# Patient Record
Sex: Female | Born: 1980
Health system: Southern US, Community
[De-identification: ages and names within clinical notes are randomized; demographics above are authoritative.]

## PROBLEM LIST (undated history)

## (undated) ENCOUNTER — Ambulatory Visit (HOSPITAL_COMMUNITY): Payer: Commercial Managed Care - PPO

## (undated) DIAGNOSIS — T7840XA Allergy, unspecified, initial encounter: Secondary | ICD-10-CM

## (undated) DIAGNOSIS — N83209 Unspecified ovarian cyst, unspecified side: Secondary | ICD-10-CM

## (undated) DIAGNOSIS — L309 Dermatitis, unspecified: Secondary | ICD-10-CM

## (undated) DIAGNOSIS — Z3490 Encounter for supervision of normal pregnancy, unspecified, unspecified trimester: Secondary | ICD-10-CM

## (undated) DIAGNOSIS — F329 Major depressive disorder, single episode, unspecified: Secondary | ICD-10-CM

## (undated) DIAGNOSIS — M549 Dorsalgia, unspecified: Secondary | ICD-10-CM

## (undated) DIAGNOSIS — G43909 Migraine, unspecified, not intractable, without status migrainosus: Secondary | ICD-10-CM

## (undated) HISTORY — DX: Dorsalgia, unspecified: M54.9

## (undated) HISTORY — DX: Allergy, unspecified, initial encounter: T78.40XA

## (undated) HISTORY — DX: Major depressive disorder, single episode, unspecified: F32.9

## (undated) HISTORY — DX: Migraine, unspecified, not intractable, without status migrainosus: G43.909

## (undated) HISTORY — PX: CERVICAL DISCECTOMY: SHX98

## (undated) HISTORY — DX: Dermatitis, unspecified: L30.9

## (undated) HISTORY — PX: CERVICAL DISC ARTHROPLASTY: SHX587

---
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
## 2000-08-08 ENCOUNTER — Encounter: Payer: Self-pay | Admitting: Obstetrics and Gynecology

## 2000-08-08 ENCOUNTER — Ambulatory Visit (HOSPITAL_COMMUNITY): Admission: RE | Admit: 2000-08-08 | Discharge: 2000-08-08 | Payer: Self-pay | Admitting: Obstetrics and Gynecology

## 2000-08-29 ENCOUNTER — Encounter: Payer: Self-pay | Admitting: Obstetrics and Gynecology

## 2000-08-29 ENCOUNTER — Ambulatory Visit (HOSPITAL_COMMUNITY): Admission: RE | Admit: 2000-08-29 | Discharge: 2000-08-29 | Payer: Self-pay | Admitting: Obstetrics and Gynecology

## 2000-12-20 ENCOUNTER — Inpatient Hospital Stay (HOSPITAL_COMMUNITY): Admission: AD | Admit: 2000-12-20 | Discharge: 2000-12-20 | Payer: Self-pay | Admitting: Obstetrics and Gynecology

## 2000-12-23 ENCOUNTER — Inpatient Hospital Stay (HOSPITAL_COMMUNITY): Admission: AD | Admit: 2000-12-23 | Discharge: 2000-12-25 | Payer: Self-pay | Admitting: Obstetrics and Gynecology

## 2000-12-30 ENCOUNTER — Encounter: Admission: RE | Admit: 2000-12-30 | Discharge: 2001-01-29 | Payer: Self-pay | Admitting: Obstetrics and Gynecology

## 2001-02-03 ENCOUNTER — Other Ambulatory Visit: Admission: RE | Admit: 2001-02-03 | Discharge: 2001-02-03 | Payer: Self-pay | Admitting: Obstetrics and Gynecology

## 2003-05-15 ENCOUNTER — Other Ambulatory Visit: Admission: RE | Admit: 2003-05-15 | Discharge: 2003-05-15 | Payer: Self-pay | Admitting: Obstetrics and Gynecology

## 2011-01-19 ENCOUNTER — Encounter (HOSPITAL_COMMUNITY): Payer: Self-pay | Admitting: *Deleted

## 2011-01-19 ENCOUNTER — Inpatient Hospital Stay (HOSPITAL_COMMUNITY)
Admission: AD | Admit: 2011-01-19 | Discharge: 2011-01-19 | Disposition: A | Discharge status: Home / Self Care | Payer: Self-pay | Source: Ambulatory Visit | Attending: Obstetrics & Gynecology | Admitting: Obstetrics & Gynecology

## 2011-01-19 ENCOUNTER — Inpatient Hospital Stay (HOSPITAL_COMMUNITY): Payer: Self-pay

## 2011-01-19 DIAGNOSIS — O200 Threatened abortion: Secondary | ICD-10-CM | POA: Insufficient documentation

## 2011-01-19 DIAGNOSIS — O209 Hemorrhage in early pregnancy, unspecified: Secondary | ICD-10-CM

## 2011-01-19 HISTORY — DX: Unspecified ovarian cyst, unspecified side: N83.209

## 2011-01-19 LAB — CBC
MCH: 31.1 pg (ref 26.0–34.0)
MCV: 89.2 fL (ref 78.0–100.0)
Platelets: 237 10*3/uL (ref 150–400)
RDW: 12.8 % (ref 11.5–15.5)

## 2011-01-19 LAB — WET PREP, GENITAL

## 2011-01-19 LAB — URINALYSIS, ROUTINE W REFLEX MICROSCOPIC
Bilirubin Urine: NEGATIVE
Glucose, UA: NEGATIVE mg/dL
Ketones, ur: NEGATIVE mg/dL
Leukocytes, UA: NEGATIVE
Nitrite: NEGATIVE
Protein, ur: NEGATIVE mg/dL
Specific Gravity, Urine: >1.03 — ABNORMAL HIGH (ref 1.005–1.030)
Urobilinogen, UA: 0.2 mg/dL (ref 0.0–1.0)
pH: 6 (ref 5.0–8.0)

## 2011-01-19 LAB — URINE MICROSCOPIC-ADD ON

## 2011-01-19 LAB — POCT PREGNANCY, URINE: Preg Test, Ur: POSITIVE

## 2011-01-19 NOTE — ED Provider Notes (Signed)
History     Chief Complaint  Patient presents with  . Vaginal Bleeding   HPI Christina [name] 30 y.o. 5w 5d gestation  Having vaginal bleeding today.  Has not had any prenatal care.   OB History    Grav Para Term Preterm Abortions TAB SAB Ect Mult Living   3 1   1 1    1       Past Medical History  Diagnosis Date  . Ovarian cyst   . Postpartum hemorrhage     No past surgical history on file.  No family history on file.  History  Substance Use Topics  . Smoking status: Current Everyday Smoker -- 0.2 packs/day  . Smokeless tobacco: Not on file  . Alcohol Use: No    Allergies:  Allergies  Allergen Reactions  . Hydrocodone Itching    Prescriptions prior to admission  Medication Sig Dispense Refill  . acetaminophen (TYLENOL) 325 MG tablet Take 650 mg by mouth every 6 (six) hours as needed. Patient takes for pain       . prenatal vitamin w/FE, FA (PRENATAL 1 + 1) 27-1 MG TABS Take 1 tablet by mouth daily.          Review of Systems  Genitourinary:       Vaginal bleeding.   Physical Exam   Blood pressure 107/61, pulse 72, temperature 98.7 F (37.1 C), temperature source Oral, resp. rate 18, height 5\' 7"  (1.702 m), weight 205 lb 12.8 oz (93.35 kg), last menstrual period 12/11/2010.  Physical Exam  Nursing note and vitals reviewed. Constitutional: She is oriented to person, place, and time. She appears well-developed and well-nourished.  HENT:  Head: Normocephalic.  Eyes: EOM are normal.  Neck: Neck supple.  GI: Soft. There is no tenderness.  Genitourinary:       Speculum exam: Vulva - dark red bleeding noted Vagina - Small amount of dark red bleeding, no odor Cervix - No contact bleeding Bimanual exam: Cervix closed Uterus unable to size due to habitus Adnexa non tender, no masses bilaterally GC/Chlam, wet prep done Chaperone present for exam.  Musculoskeletal: Normal range of motion.  Neurological: She is alert and oriented to person, place, and  time.  Skin: Skin is warm and dry.  Psychiatric: She has a normal mood and affect.    MAU Course  Procedures  MDM Ultrasound - IUGS  5w 5d with no yolk sac, no fetal pole and moderate subchorionic hemorrhage.  Results for orders placed during the hospital encounter of 01/19/11 (from the past 24 hour(s))  URINALYSIS, ROUTINE W REFLEX MICROSCOPIC     Status: Abnormal   Collection Time   01/19/11  7:30 PM      Component Value Range   Color, Urine YELLOW  YELLOW    Appearance CLEAR  CLEAR    Specific Gravity, Urine >1.030 (*) 1.005 - 1.030    pH 6.0  5.0 - 8.0    Glucose, UA NEGATIVE  NEGATIVE (mg/dL)   Hgb urine dipstick LARGE (*) NEGATIVE    Bilirubin Urine NEGATIVE  NEGATIVE    Ketones, ur NEGATIVE  NEGATIVE (mg/dL)   Protein, ur NEGATIVE  NEGATIVE (mg/dL)   Urobilinogen, UA 0.2  0.0 - 1.0 (mg/dL)   Nitrite NEGATIVE  NEGATIVE    Leukocytes, UA NEGATIVE  NEGATIVE   URINE MICROSCOPIC-ADD ON     Status: Abnormal   Collection Time   01/19/11  7:30 PM      Component Value Range  Squamous Epithelial / LPF RARE  RARE    WBC, UA 0-2  <3 (WBC/hpf)   RBC / HPF 7-10  <3 (RBC/hpf)   Bacteria, UA FEW (*) RARE   POCT PREGNANCY, URINE     Status: Normal   Collection Time   01/19/11  8:09 PM      Component Value Range   Preg Test, Ur POSITIVE    CBC     Status: Abnormal   Collection Time   01/19/11  8:20 PM      Component Value Range   WBC 12.1 (*) 4.0 - 10.5 (K/uL)   RBC 4.09  3.87 - 5.11 (MIL/uL)   Hemoglobin 12.7  12.0 - 15.0 (g/dL)   HCT 29.5  62.1 - 30.8 (%)   MCV 89.2  78.0 - 100.0 (fL)   MCH 31.1  26.0 - 34.0 (pg)   MCHC 34.8  30.0 - 36.0 (g/dL)   RDW 65.7  84.6 - 96.2 (%)   Platelets 237  150 - 400 (K/uL)  HCG, QUANTITATIVE, PREGNANCY     Status: Abnormal   Collection Time   01/19/11  8:20 PM      Component Value Range   hCG, Beta Chain, Quant, S 5540 (*) <5 (mIU/mL)  ABO/RH     Status: Normal   Collection Time   01/19/11  8:20 PM      Component Value Range    ABO/RH(D) A POS    WET PREP, GENITAL     Status: Abnormal   Collection Time   01/19/11  8:30 PM      Component Value Range   Yeast, Wet Prep NONE SEEN  NONE SEEN    Trich, Wet Prep NONE SEEN  NONE SEEN    Clue Cells, Wet Prep FEW (*) NONE SEEN    WBC, Wet Prep HPF POC FEW (*) NONE SEEN     Assessment and Plan  Bleeding in pregnancy Moderate subchorionic hemorrhage Threatened miscarriage  Plan: Repeat ultrasound in one week If bleeding worsens, may have a miscarriage   Leviathan Macera 01/19/2011, 8:13 PM   Nolene Bernheim, NP 01/19/11 2143

## 2011-01-19 NOTE — Progress Notes (Cosign Needed)
Pt G3 P1, +UPT at home 2 wks ago.  LMP 12/08/2010, pt reports vag bleeding in panties this evening with cramping.

## 2011-01-20 LAB — GC/CHLAMYDIA PROBE AMP, GENITAL: Chlamydia, DNA Probe: NEGATIVE

## 2011-01-27 ENCOUNTER — Inpatient Hospital Stay (HOSPITAL_COMMUNITY): Payer: Self-pay

## 2011-01-27 ENCOUNTER — Inpatient Hospital Stay (HOSPITAL_COMMUNITY)
Admission: AD | Admit: 2011-01-27 | Discharge: 2011-01-27 | Disposition: A | Discharge status: Home / Self Care | Payer: Self-pay | Source: Ambulatory Visit | Attending: Obstetrics & Gynecology | Admitting: Obstetrics & Gynecology

## 2011-01-27 DIAGNOSIS — O039 Complete or unspecified spontaneous abortion without complication: Secondary | ICD-10-CM | POA: Insufficient documentation

## 2011-01-27 DIAGNOSIS — O209 Hemorrhage in early pregnancy, unspecified: Secondary | ICD-10-CM | POA: Insufficient documentation

## 2011-01-27 MED ORDER — IBUPROFEN 600 MG PO TABS: 600.0000 mg | ORAL_TABLET | Freq: Four times a day (QID) | ORAL | Status: AC | PRN

## 2011-01-27 MED ORDER — OXYCODONE-ACETAMINOPHEN 5-325 MG PO TABS: 1.0000 | ORAL_TABLET | ORAL | Status: AC | PRN

## 2011-01-27 MED ORDER — MISOPROSTOL 200 MCG PO TABS: 200.0000 ug | ORAL_TABLET | Freq: Once | ORAL | Status: DC

## 2011-01-27 MED ORDER — PROMETHAZINE HCL 25 MG PO TABS: 25.0000 mg | ORAL_TABLET | Freq: Four times a day (QID) | ORAL | Status: AC | PRN

## 2011-01-27 NOTE — Progress Notes (Signed)
Pt returns for repeat u/s. Seen  10/30. Pt reprts no bleeding at this time. Denies pain.

## 2011-01-27 NOTE — ED Provider Notes (Signed)
Christina [name] 54 y.o.G3P0011 @[redacted]w[redacted]d  Chief Complaint  Patient presents with  . Follow-up    SUBJECTIVE  HPI: Seen here 1 wk ago for bleeding. Quant was 5540. US showed GS [redacted]w[redacted]d, no YS. Here for scheduled rpt [name]. Denies abd pain or bleeding.   Past Medical History  Diagnosis Date  . Ovarian cyst   . Postpartum hemorrhage    No past surgical history on file. History   Social History  . Marital Status: Legally Separated    Spouse Name: N/A    Number of Children: N/A  . Years of Education: N/A   Occupational History  . Not on file.   Social History Main Topics  . Smoking status: Current Everyday Smoker -- 0.2 packs/day  . Smokeless tobacco: Not on file  . Alcohol Use: No  . Drug Use: No  . Sexually Active:    Other Topics Concern  . Not on file   Social History Narrative  . No narrative on file   No current facility-administered medications on file prior to encounter.   Current Outpatient Prescriptions on File Prior to Encounter  Medication Sig Dispense Refill  . acetaminophen (TYLENOL) 325 MG tablet Take 650 mg by mouth every 6 (six) hours as needed. Patient takes for pain       . prenatal vitamin w/FE, FA (PRENATAL 1 + 1) 27-1 MG TABS Take 1 tablet by mouth daily.         Allergies  Allergen Reactions  . Hydrocodone Itching    ROS: Pertinent items in HPI  OBJECTIVE  BP 109/56  Pulse 77  Ht 5\' 7"  (1.702 m)  Wt 92.987 kg (205 lb)  BMI 32.11 kg/m2  SpO2 96%  LMP 12/11/2010  A pos blood type   Physical Exam  Constitutional: She is oriented to person, place, and time and well-developed, well-nourished, and in no distress.  HENT:  Head: Normocephalic.  Neck: Neck supple.  Abdominal: Soft. There is no tenderness.  Neurological: She is alert and oriented to person, place, and time.  Skin: Skin is warm and dry.  Psychiatric: Affect normal.   US Ob Transvaginal  01/27/2011  *RADIOLOGY REPORT*  Clinical Data: Pregnant with vaginal bleeding.   TRANSVAGINAL OB ULTRASOUND  Technique:  Transvaginal ultrasound was performed for evaluation of the gestation as well as the maternal uterus and adnexal regions.  Comparison: 01/19/2011.  Findings: There is a single intrauterine gestational sac with a mean sac diameter of 1.0 cm which correlates with a 5-week-5-day gestation.  No yolk sac or embryo is identified.  A moderate subchorionic hemorrhage is noted.  This measures 2.5 x 1.1 x 2.2 cm.  Both ovaries are normal.  No free pelvic fluid collections.  IMPRESSION:  1.  Single intrauterine gestational sac estimated at 5 weeks and 5 days gestation.  8 days ago it was 5 weeks and 4 days gestation. No yolk sac or embryo is identified. 2.  Slightly smaller subchorionic hemorrhage. 3.  Normal ovaries.  Original Report Authenticated By: P. Loralie Champagne, M.D.   ASSESSMENT   Nonviable pregnancy  PLAN D/W Dr. Despina Hidden: obtain quant and if dropping may offer cytotec vs. expectant management. If increased, follow quant in 3-7d. Bernita Buffy, CNM evaluated the patient and discussed with patient and family results of the ultrasound and failed pregnancy.   Care turned over to [facility] @ 9:00pm. Patient awaiting Bhcg. Patient states she is ready to go home and request Rx for the cytotec and to be called about  results of the Bhcg.       Early Intrauterine Pregnancy Failure  _x__  Documented intrauterine pregnancy failure less than or equal to [redacted] weeks gestation  _x__  No serious current illness  __x_  Baseline Hgb greater than or equal to 10g/dl  _x__  Patient has easily accessible transportation to the hospital  __x_  Clear preference  __x_  Practitioner/physician deems patient reliable  _x__  Counseling by practitioner or physician  _x__  Patient education by RN  _x__  Consent form signed  ___  Rho-Gam given by RN if indicated  _x__ Medication dispensed   ___   Cytotec 800 mcg  _x_   Intravaginally by patient at home         __   Intravaginally by  RN in MAU        __   Rectally by patient at home        __   Rectally by RN in MAU  _x__  Ibuprofen 600 mg 1 tablet by mouth every 6 hours as needed #30  _x__  Hydrocodone/acetaminophen 5/325 mg by mouth every 4 to 6 hours as needed  __x_  Phenergan 12.5 mg by mouth every 4 hours as needed for nausea  The pregnancy hormone level tonight has increased from 5,540 eight days ago to 10,587 today.  Although the numbers did increase the rise ws not normal. I discussed the results with Dr. Despina Hidden and we will give the patient option of holding the Cytotec and repeat the ultrasound in one week or using the Cytotec.  I discussed the lab results in detail with the patient and the option to return in one week to repeat the ultrasound. The patient states that she has accepted that this is a pregnancy failure and wants to go ahead with the Cytotec. She will return in 2 weeks. She will return immediately for heavy bleeding, severe pain, fever or other problems.  [place], [place] 01/27/11 2232

## 2011-03-23 NOTE — L&D Delivery Note (Signed)
Delivery Note At 4:41 PM a viable female was delivered via Vaginal, Spontaneous Delivery (Presentation: Middle Occiput Anterior).  APGAR: 9, 9; weight P.   Placenta status: Intact, Spontaneous.  Cord: 3 vessels with the following complications: None.    Anesthesia: Epidural  Episiotomy: None Lacerations: Labial B Suture Repair: 3.0 vicryl rapide Est. Blood Loss (mL): 400  Mom to postpartum.  Baby to stay with mom.  BOVARD,Najiyah Paris 03/11/2012, 4:59 PM  A+/Br/Contra?Rachelle Hora

## 2012-03-11 ENCOUNTER — Encounter (HOSPITAL_COMMUNITY): Payer: Self-pay | Admitting: *Deleted

## 2012-03-11 ENCOUNTER — Inpatient Hospital Stay (HOSPITAL_COMMUNITY): Payer: Medicaid Other | Admitting: Anesthesiology

## 2012-03-11 ENCOUNTER — Encounter (HOSPITAL_COMMUNITY): Payer: Self-pay | Admitting: Anesthesiology

## 2012-03-11 ENCOUNTER — Inpatient Hospital Stay (HOSPITAL_COMMUNITY)
Admission: EM | Admit: 2012-03-11 | Discharge: 2012-03-12 | DRG: 775 | Disposition: A | Discharge status: Home / Self Care | Payer: Medicaid Other | Source: Ambulatory Visit | Attending: Obstetrics and Gynecology | Admitting: Obstetrics and Gynecology

## 2012-03-11 DIAGNOSIS — Z3490 Encounter for supervision of normal pregnancy, unspecified, unspecified trimester: Secondary | ICD-10-CM

## 2012-03-11 DIAGNOSIS — O99334 Smoking (tobacco) complicating childbirth: Secondary | ICD-10-CM | POA: Diagnosis present

## 2012-03-11 HISTORY — DX: Encounter for supervision of normal pregnancy, unspecified, unspecified trimester: Z34.90

## 2012-03-11 LAB — CBC
MCH: 30.3 pg (ref 26.0–34.0)
MCHC: 33.8 g/dL (ref 30.0–36.0)
MCV: 89.5 fL (ref 78.0–100.0)
Platelets: 206 10*3/uL (ref 150–400)
RBC: 3.8 MIL/uL — ABNORMAL LOW (ref 3.87–5.11)

## 2012-03-11 MED ORDER — ZOLPIDEM TARTRATE 5 MG PO TABS: 5.0000 mg | ORAL_TABLET | Freq: Every evening | ORAL | Status: DC | PRN

## 2012-03-11 MED ORDER — PRENATAL MULTIVITAMIN CH: 1.0000 | ORAL_TABLET | Freq: Every day | ORAL | Status: DC

## 2012-03-11 MED ORDER — EPHEDRINE 5 MG/ML INJ: 10.0000 mg | INTRAVENOUS | Status: DC | PRN

## 2012-03-11 MED ORDER — BUTORPHANOL TARTRATE 1 MG/ML IJ SOLN
1.0000 mg | INTRAMUSCULAR | Status: DC | PRN
  Administered 2012-03-11 (×2): 1 mg via INTRAVENOUS
  Filled 2012-03-11: qty 1

## 2012-03-11 MED ORDER — BENZOCAINE-MENTHOL 20-0.5 % EX AERO
1.0000 "application " | INHALATION_SPRAY | CUTANEOUS | Status: DC | PRN
  Filled 2012-03-11: qty 56

## 2012-03-11 MED ORDER — TERBUTALINE SULFATE 1 MG/ML IJ SOLN: 0.2500 mg | Freq: Once | INTRAMUSCULAR | Status: DC | PRN

## 2012-03-11 MED ORDER — CITRIC ACID-SODIUM CITRATE 334-500 MG/5ML PO SOLN: 30.0000 mL | ORAL | Status: DC | PRN

## 2012-03-11 MED ORDER — SIMETHICONE 80 MG PO CHEW: 80.0000 mg | CHEWABLE_TABLET | ORAL | Status: DC | PRN

## 2012-03-11 MED ORDER — DIPHENHYDRAMINE HCL 25 MG PO CAPS: 25.0000 mg | ORAL_CAPSULE | Freq: Four times a day (QID) | ORAL | Status: DC | PRN

## 2012-03-11 MED ORDER — LIDOCAINE HCL (PF) 1 % IJ SOLN
INTRAMUSCULAR | Status: DC | PRN
  Administered 2012-03-11 (×4): 4 mL

## 2012-03-11 MED ORDER — LACTATED RINGERS IV SOLN
500.0000 mL | Freq: Once | INTRAVENOUS | Status: AC
  Administered 2012-03-11: 12:00:00 via INTRAVENOUS

## 2012-03-11 MED ORDER — SENNOSIDES-DOCUSATE SODIUM 8.6-50 MG PO TABS: 2.0000 | ORAL_TABLET | Freq: Every day | ORAL | Status: DC

## 2012-03-11 MED ORDER — OXYTOCIN 40 UNITS IN LACTATED RINGERS INFUSION - SIMPLE MED
62.5000 mL/h | INTRAVENOUS | Status: DC
  Filled 2012-03-11: qty 1000

## 2012-03-11 MED ORDER — OXYCODONE-ACETAMINOPHEN 5-325 MG PO TABS: 1.0000 | ORAL_TABLET | ORAL | Status: DC | PRN

## 2012-03-11 MED ORDER — BUTORPHANOL TARTRATE 1 MG/ML IJ SOLN
INTRAMUSCULAR | Status: AC
  Administered 2012-03-11: 1 mg via INTRAVENOUS
  Filled 2012-03-11: qty 1

## 2012-03-11 MED ORDER — DIPHENHYDRAMINE HCL 50 MG/ML IJ SOLN: 12.5000 mg | INTRAMUSCULAR | Status: DC | PRN

## 2012-03-11 MED ORDER — OXYTOCIN BOLUS FROM INFUSION: 500.0000 mL | INTRAVENOUS | Status: DC

## 2012-03-11 MED ORDER — ONDANSETRON HCL 4 MG PO TABS: 4.0000 mg | ORAL_TABLET | ORAL | Status: DC | PRN

## 2012-03-11 MED ORDER — PHENYLEPHRINE 40 MCG/ML (10ML) SYRINGE FOR IV PUSH (FOR BLOOD PRESSURE SUPPORT): 80.0000 ug | PREFILLED_SYRINGE | INTRAVENOUS | Status: DC | PRN

## 2012-03-11 MED ORDER — LANOLIN HYDROUS EX OINT: TOPICAL_OINTMENT | CUTANEOUS | Status: DC | PRN

## 2012-03-11 MED ORDER — ONDANSETRON HCL 4 MG/2ML IJ SOLN: 4.0000 mg | Freq: Four times a day (QID) | INTRAMUSCULAR | Status: DC | PRN

## 2012-03-11 MED ORDER — LIDOCAINE HCL (PF) 1 % IJ SOLN
30.0000 mL | INTRAMUSCULAR | Status: DC | PRN
  Filled 2012-03-11: qty 30

## 2012-03-11 MED ORDER — ACETAMINOPHEN 325 MG PO TABS: 650.0000 mg | ORAL_TABLET | ORAL | Status: DC | PRN

## 2012-03-11 MED ORDER — LACTATED RINGERS IV SOLN
INTRAVENOUS | Status: DC
  Administered 2012-03-11: 13:00:00 via INTRAVENOUS

## 2012-03-11 MED ORDER — PRENATAL PLUS 27-1 MG PO TABS
1.0000 | ORAL_TABLET | Freq: Every day | ORAL | Status: DC
  Administered 2012-03-12: 1 via ORAL
  Filled 2012-03-11: qty 1

## 2012-03-11 MED ORDER — LACTATED RINGERS IV SOLN: 500.0000 mL | INTRAVENOUS | Status: DC | PRN

## 2012-03-11 MED ORDER — ONDANSETRON HCL 4 MG/2ML IJ SOLN: 4.0000 mg | INTRAMUSCULAR | Status: DC | PRN

## 2012-03-11 MED ORDER — WITCH HAZEL-GLYCERIN EX PADS: 1.0000 "application " | MEDICATED_PAD | CUTANEOUS | Status: DC | PRN

## 2012-03-11 MED ORDER — FENTANYL 2.5 MCG/ML BUPIVACAINE 1/10 % EPIDURAL INFUSION (WH - ANES)
14.0000 mL/h | INTRAMUSCULAR | Status: DC
  Administered 2012-03-11: 14 mL/h via EPIDURAL
  Filled 2012-03-11: qty 125

## 2012-03-11 MED ORDER — EPHEDRINE 5 MG/ML INJ
10.0000 mg | INTRAVENOUS | Status: DC | PRN
  Filled 2012-03-11: qty 4

## 2012-03-11 MED ORDER — LACTATED RINGERS IV SOLN: INTRAVENOUS | Status: DC

## 2012-03-11 MED ORDER — IBUPROFEN 600 MG PO TABS
600.0000 mg | ORAL_TABLET | Freq: Four times a day (QID) | ORAL | Status: DC
  Administered 2012-03-12 (×4): 600 mg via ORAL
  Filled 2012-03-11 (×4): qty 1

## 2012-03-11 MED ORDER — IBUPROFEN 600 MG PO TABS: 600.0000 mg | ORAL_TABLET | Freq: Four times a day (QID) | ORAL | Status: DC | PRN

## 2012-03-11 MED ORDER — PHENYLEPHRINE 40 MCG/ML (10ML) SYRINGE FOR IV PUSH (FOR BLOOD PRESSURE SUPPORT)
80.0000 ug | PREFILLED_SYRINGE | INTRAVENOUS | Status: DC | PRN
  Filled 2012-03-11: qty 5

## 2012-03-11 MED ORDER — OXYTOCIN 40 UNITS IN LACTATED RINGERS INFUSION - SIMPLE MED: 1.0000 m[IU]/min | INTRAVENOUS | Status: DC

## 2012-03-11 MED ORDER — DIBUCAINE 1 % RE OINT: 1.0000 "application " | TOPICAL_OINTMENT | RECTAL | Status: DC | PRN

## 2012-03-11 NOTE — Progress Notes (Signed)
Patient ID: Christina [name], female   DOB: November 20, 1980, 31 y.o.   MRN: [id number] AROM for clear fluid, w/o difficulty/complication SVE 5.5/90/0  FHTs 120's, mod var toco q 

## 2012-03-11 NOTE — Anesthesia Postprocedure Evaluation (Signed)
  Anesthesia Post-op Note  Patient: Christina [name]  Procedure(s) Performed: * No procedures listed *  Patient Location: PACU and Mother/Baby  Anesthesia Type:Epidural  Level of Consciousness: awake, alert  and oriented  Airway and Oxygen Therapy: Patient Spontanous Breathing    Post-op Assessment: Patient's Cardiovascular Status Stable and Respiratory Function Stable  Post-op Vital Signs: stable  Complications: No apparent anesthesia complications

## 2012-03-11 NOTE — H&P (Signed)
Christina [name] is a 31 y.o. female G4P1021 at 39+ with regular, painful contractions.  PNC uncomplicated except late entry to care, about 15wks.  H/O PIH.  Pt also is a smoker.  +FM, no LOF, no VB.   Maternal Medical History:  Reason for admission: Reason for admission: contractions.  Contractions: Onset was 6-12 hours ago.   Frequency: regular.   Perceived severity is moderate.    Fetal activity: Perceived fetal activity is normal.      OB History    Grav Para Term Preterm Abortions TAB SAB Ect Mult Living   4 1   2  0 2   1    G1 TAB, G2 SVD 6#14, female, G3 SAB, G4 present; no STDs,    Past Medical History  Diagnosis Date  . Ovarian cyst   . Postpartum hemorrhage   . Normal pregnancy 03/11/2012   PMH none PSH WTE, TAB Family History:breast cancer, CAD, HTN, Lung CA, MI Social History:  reports that she has been smoking.  She does not have any smokeless tobacco history on file. She reports that she does not drink alcohol or use illicit drugs. Meds PNV All NKDA - hydrocodone itch   Prenatal Transfer Tool  Maternal Diabetes: No Genetic Screening: Normal Maternal Ultrasounds/Referrals: Normal Fetal Ultrasounds or other Referrals:  None Maternal Substance Abuse:  Yes:  Type: Smoker Significant Maternal Medications:  None Significant Maternal Lab Results:  Lab values include: Group B Strep negative Other Comments:  late entry to [facility] - 15 week  Review of Systems  Constitutional: Negative.   HENT: Negative.   Eyes: Negative.   Respiratory: Negative.   Cardiovascular: Negative.   Gastrointestinal: Negative.   Genitourinary: Negative.   Musculoskeletal: Negative.   Skin: Negative.   Neurological: Negative.   Psychiatric/Behavioral: Negative.     Dilation: 5.5 Effacement (%): 90 Station: -1 Exam by:: Rzhang,rnc-ob Blood pressure 94/34, pulse 78, temperature 98.6 F (37 C), temperature source Oral, resp. rate 20, height 5' 6.5" (1.689 m), weight 102.694 kg  (226 lb 6.4 oz). Maternal Exam:  Uterine Assessment: Contraction strength is moderate.  Contraction frequency is regular.   Abdomen: Fundal height is appropriate for gestation.   Fetal presentation: vertex  Introitus: Normal vulva. Normal vagina.  Pelvis: adequate for delivery.   Cervix: Cervix evaluated by digital exam.     Physical Exam  Constitutional: She is oriented to person, place, and time. She appears well-developed and well-nourished.  Cardiovascular: Normal rate and regular rhythm.   Respiratory: Effort normal and breath sounds normal. No respiratory distress.  GI: Soft. Bowel sounds are normal. There is no tenderness.  Musculoskeletal: Normal range of motion.  Neurological: She is alert and oriented to person, place, and time.  Skin: Skin is warm and dry.  Psychiatric: She has a normal mood and affect. Her behavior is normal.    Prenatal labs: ABO, Rh: --/--/A POS (12/21 0830) Antibody: NEG (12/21 0830) Rubella:  immune  RPR:   NR HBsAg:   neg HIV:   neg GBS:   neg Hgb 13.0/ Pap WNL HR HPV neg/ Ur Cx neg/ Plt 223K/ GC neg/ Chl neg/ CF neg/ AFP WNL/  EDC 12/23 - dated by 15wk scan Anat [name] - female, nl anat, post plac  Tdap 10/1, flu declines  Assessment/Plan: 31yo [id number]  At 39+ in labor gbbs neg Expect SVD Epidural, stadol, pitocin prn   BOVARD,Chalene Treu 03/11/2012, 11:13 AM

## 2012-03-11 NOTE — Anesthesia Preprocedure Evaluation (Addendum)
Anesthesia Evaluation  Patient identified by MRN, date of birth, ID band Patient awake    Reviewed: Allergy & Precautions, H&P , NPO status , Patient's Chart, lab work & pertinent test results, reviewed documented beta blocker date and time   History of Anesthesia Complications Negative for: history of anesthetic complications  Airway Mallampati: I TM Distance: >3 FB Neck ROM: full    Dental  (+) Teeth Intact   Pulmonary Current Smoker,  breath sounds clear to auscultation        Cardiovascular negative cardio ROS  Rhythm:regular Rate:Normal     Neuro/Psych negative neurological ROS  negative psych ROS   GI/Hepatic negative GI ROS, Neg liver ROS,   Endo/Other  negative endocrine ROS  Renal/GU negative Renal ROS  negative genitourinary   Musculoskeletal   Abdominal   Peds  Hematology negative hematology ROS (+)   Anesthesia Other Findings   Reproductive/Obstetrics (+) Pregnancy                          Anesthesia Physical Anesthesia Plan  ASA: II  Anesthesia Plan: Epidural   Post-op Pain Management:    Induction:   Airway Management Planned:   Additional Equipment:   Intra-op Plan:   Post-operative Plan:   Informed Consent: I have reviewed the patients History and Physical, chart, labs and discussed the procedure including the risks, benefits and alternatives for the proposed anesthesia with the patient or authorized representative who has indicated his/her understanding and acceptance.     Plan Discussed with:   Anesthesia Plan Comments:         Anesthesia Quick Evaluation

## 2012-03-11 NOTE — Progress Notes (Signed)
Dr Ellyn Hack notified of pts VE, FHR, and contraction pattern, orders received

## 2012-03-11 NOTE — Anesthesia Procedure Notes (Signed)
Epidural Patient location during procedure: OB Start time: 03/11/2012 12:18 PM  Staffing Performed by: anesthesiologist   Preanesthetic Checklist Completed: patient identified, site marked, surgical consent, pre-op evaluation, timeout performed, IV checked, risks and benefits discussed and monitors and equipment checked  Epidural Patient position: sitting Prep: site prepped and draped and DuraPrep Patient monitoring: continuous pulse ox and blood pressure Approach: midline Injection technique: LOR air  Needle:  Needle type: Tuohy  Needle gauge: 17 G Needle length: 9 cm and 9 Needle insertion depth: 6.5 cm Catheter type: closed end flexible Catheter size: 19 Gauge Catheter at skin depth: 11.5 cm Test dose: negative  Assessment Events: blood not aspirated, injection not painful, no injection resistance, negative IV test and no paresthesia  Additional Notes Discussed risk of headache, infection, bleeding, nerve injury and failed or incomplete block.  Patient voices understanding and wishes to proceed.  Epidural placed easily on first attempt.  Patient tolerated procedure well with no apparent complications.  Jasmine December, MD Reason for block:procedure for pain

## 2012-03-12 LAB — CBC
HCT: 31 % — ABNORMAL LOW (ref 36.0–46.0)
Hemoglobin: 10.5 g/dL — ABNORMAL LOW (ref 12.0–15.0)
MCHC: 33.9 g/dL (ref 30.0–36.0)
RDW: 13.5 % (ref 11.5–15.5)
WBC: 18.1 10*3/uL — ABNORMAL HIGH (ref 4.0–10.5)

## 2012-03-12 MED ORDER — PRENATAL PLUS 27-1 MG PO TABS: 1.0000 | ORAL_TABLET | Freq: Every day | ORAL | Status: DC

## 2012-03-12 MED ORDER — IBUPROFEN 800 MG PO TABS: 800.0000 mg | ORAL_TABLET | Freq: Four times a day (QID) | ORAL | Status: DC

## 2012-03-12 MED ORDER — OXYCODONE-ACETAMINOPHEN 5-325 MG PO TABS: 1.0000 | ORAL_TABLET | ORAL | Status: DC | PRN

## 2012-03-12 NOTE — Progress Notes (Addendum)
Post Partum Day 1 Subjective: no complaints, up ad lib, tolerating PO and nl lochia, pain controlled  Objective: Blood pressure 95/60, pulse 88, temperature 98 F (36.7 C), temperature source Oral, resp. rate 20, height 5' 6.5" (1.689 m), weight 102.694 kg (226 lb 6.4 oz), SpO2 100.00%, unknown if currently breastfeeding.  Physical Exam:  General: alert and no distress Lochia: appropriate Uterine Fundus: firm   Basename 03/12/12 0527 03/11/12 0830  HGB 10.5* 11.5*  HCT 31.0* 34.0*    Assessment/Plan: Plan for discharge tomorrow.  Doing well.  Routine care.  Pt desires to be discharged today, OK per peds.  Will d/c today with Motrin, Percocet, PNV.     LOS: 1 day   BOVARD,Nicholes Hibler 03/12/2012, 8:26 AM

## 2012-03-12 NOTE — Discharge Summary (Signed)
Obstetric Discharge Summary Reason for Admission: onset of labor Prenatal Procedures: none Intrapartum Procedures: spontaneous vaginal delivery Postpartum Procedures: none Complications-Operative and Postpartum: labial laceration Hemoglobin  Date Value Range Status  03/12/2012 10.5* 12.0 - 15.0 g/dL Final     HCT  Date Value Range Status  03/12/2012 31.0* 36.0 - 46.0 % Final    Physical Exam:  General: alert and no distress Lochia: appropriate Uterine Fundus: firm  Discharge Diagnoses: Term Pregnancy-delivered  Discharge Information: Date: 03/12/2012 Activity: pelvic rest Diet: routine Medications: PNV, Ibuprofen and Percocet Condition: stable Instructions: refer to practice specific booklet Discharge to: home Follow-up Information    Follow up with BOVARD,Emmry Hinsch, MD. Schedule an appointment as soon as possible for a visit in 6 weeks.   Contact information:   [address] [place] [address] [phone]          Newborn Data: Live born female  Birth Weight: 6 lb 10.9 oz (3031 g) APGAR: 9, 9  Home with mother.  BOVARD,Carlota Philley 03/12/2012, 9:06 AM

## 2012-03-13 LAB — TYPE AND SCREEN: Unit division: 0

## 2012-03-16 ENCOUNTER — Telehealth (HOSPITAL_COMMUNITY): Payer: Self-pay | Admitting: *Deleted

## 2012-03-16 NOTE — Telephone Encounter (Signed)
Resolve episode 

## 2012-04-12 LAB — HM PAP SMEAR: HM Pap smear: NORMAL

## 2013-12-31 ENCOUNTER — Telehealth: Payer: Self-pay

## 2013-12-31 DIAGNOSIS — E669 Obesity, unspecified: Secondary | ICD-10-CM

## 2013-12-31 NOTE — Telephone Encounter (Signed)
Ok for labs- dx obesity

## 2013-12-31 NOTE — Telephone Encounter (Signed)
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 She is in need of CBC-diff, Cmet, T4 and TSH. She is not scheduled to see Dr.Lowne until 02/22/14. Please advise      KP

## 2014-01-01 ENCOUNTER — Other Ambulatory Visit (INDEPENDENT_AMBULATORY_CARE_PROVIDER_SITE_OTHER): Payer: 59

## 2014-01-01 DIAGNOSIS — E669 Obesity, unspecified: Secondary | ICD-10-CM

## 2014-01-01 LAB — CBC WITH DIFFERENTIAL/PLATELET
BASOS ABS: 0.1 10*3/uL (ref 0.0–0.1)
Basophils Relative: 0.9 % (ref 0.0–3.0)
EOS ABS: 0.3 10*3/uL (ref 0.0–0.7)
Eosinophils Relative: 2.9 % (ref 0.0–5.0)
HEMATOCRIT: 44.3 % (ref 36.0–46.0)
Hemoglobin: 14.4 g/dL (ref 12.0–15.0)
LYMPHS ABS: 1.5 10*3/uL (ref 0.7–4.0)
Lymphocytes Relative: 16.1 % (ref 12.0–46.0)
MCHC: 32.6 g/dL (ref 30.0–36.0)
MCV: 88.4 fl (ref 78.0–100.0)
MONO ABS: 0.7 10*3/uL (ref 0.1–1.0)
MONOS PCT: 6.9 % (ref 3.0–12.0)
Neutro Abs: 6.9 10*3/uL (ref 1.4–7.7)
Neutrophils Relative %: 73.2 % (ref 43.0–77.0)
PLATELETS: 231 10*3/uL (ref 150.0–400.0)
RBC: 5.01 Mil/uL (ref 3.87–5.11)
RDW: 14.3 % (ref 11.5–15.5)
WBC: 9.5 10*3/uL (ref 4.0–10.5)

## 2014-01-01 LAB — COMPREHENSIVE METABOLIC PANEL
ALK PHOS: 50 U/L (ref 39–117)
ALT: 11 U/L (ref 0–35)
AST: 17 U/L (ref 0–37)
Albumin: 4.1 g/dL (ref 3.5–5.2)
BILIRUBIN TOTAL: 1.2 mg/dL (ref 0.2–1.2)
BUN: 12 mg/dL (ref 6–23)
CO2: 24 meq/L (ref 19–32)
Calcium: 9.4 mg/dL (ref 8.4–10.5)
Chloride: 105 mEq/L (ref 96–112)
Creatinine, Ser: 0.8 mg/dL (ref 0.4–1.2)
GFR: 85.36 mL/min (ref >60.00)
Glucose, Bld: 85 mg/dL (ref 70–99)
Potassium: 4.3 mEq/L (ref 3.5–5.1)
SODIUM: 138 meq/L (ref 135–145)
TOTAL PROTEIN: 7.8 g/dL (ref 6.0–8.3)

## 2014-01-01 LAB — TSH: TSH: 0.89 u[IU]/mL (ref 0.35–4.50)

## 2014-01-01 LAB — T4, FREE: Free T4: 0.89 ng/dL (ref 0.60–1.60)

## 2014-01-02 ENCOUNTER — Other Ambulatory Visit (INDEPENDENT_AMBULATORY_CARE_PROVIDER_SITE_OTHER): Payer: 59

## 2014-01-02 ENCOUNTER — Encounter: Payer: Self-pay | Admitting: General Practice

## 2014-01-02 DIAGNOSIS — E669 Obesity, unspecified: Secondary | ICD-10-CM

## 2014-01-02 NOTE — Telephone Encounter (Signed)
Pt is wanting to have cholesterol labs entered as well.  Pt did lab work yesterday.  Can we had orders to that blood that was taken?

## 2014-01-02 NOTE — Telephone Encounter (Signed)
Can we add lipids to labs drawn yesterday?  Dx obesity

## 2014-01-03 ENCOUNTER — Encounter (HOSPITAL_COMMUNITY): Payer: Self-pay | Admitting: Family Medicine

## 2014-01-03 ENCOUNTER — Emergency Department (INDEPENDENT_AMBULATORY_CARE_PROVIDER_SITE_OTHER)
Admission: EM | Admit: 2014-01-03 | Discharge: 2014-01-03 | Disposition: A | Discharge status: Home / Self Care | Payer: 59 | Source: Home / Self Care | Attending: Family Medicine | Admitting: Family Medicine

## 2014-01-03 ENCOUNTER — Encounter: Payer: Self-pay | Admitting: General Practice

## 2014-01-03 DIAGNOSIS — A084 Viral intestinal infection, unspecified: Secondary | ICD-10-CM

## 2014-01-03 LAB — POCT URINALYSIS DIP (DEVICE)
GLUCOSE, UA: NEGATIVE mg/dL
KETONES UR: NEGATIVE mg/dL
LEUKOCYTES UA: NEGATIVE
Nitrite: NEGATIVE
PROTEIN: 30 mg/dL — AB
Specific Gravity, Urine: >=1.03 (ref 1.005–1.030)
Urobilinogen, UA: 0.2 mg/dL (ref 0.0–1.0)
pH: 5.5 (ref 5.0–8.0)

## 2014-01-03 LAB — LIPID PANEL
CHOL/HDL RATIO: 4
Cholesterol: 157 mg/dL (ref 0–200)
HDL: 41.7 mg/dL (ref >39.00)
LDL CALC: 105 mg/dL — AB (ref 0–99)
NonHDL: 115.3
TRIGLYCERIDES: 50 mg/dL (ref 0.0–149.0)
VLDL: 10 mg/dL (ref 0.0–40.0)

## 2014-01-03 LAB — CBC WITH DIFFERENTIAL/PLATELET
Basophils Absolute: 0 10*3/uL (ref 0.0–0.1)
Basophils Relative: 0 % (ref 0–1)
EOS ABS: 0.1 10*3/uL (ref 0.0–0.7)
Eosinophils Relative: 1 % (ref 0–5)
HEMATOCRIT: 43.2 % (ref 36.0–46.0)
Hemoglobin: 14.9 g/dL (ref 12.0–15.0)
LYMPHS ABS: 0.9 10*3/uL (ref 0.7–4.0)
Lymphocytes Relative: 11 % — ABNORMAL LOW (ref 12–46)
MCH: 30 pg (ref 26.0–34.0)
MCHC: 34.5 g/dL (ref 30.0–36.0)
MCV: 87.1 fL (ref 78.0–100.0)
MONO ABS: 0.4 10*3/uL (ref 0.1–1.0)
Monocytes Relative: 5 % (ref 3–12)
Neutro Abs: 6.7 10*3/uL (ref 1.7–7.7)
Neutrophils Relative %: 83 % — ABNORMAL HIGH (ref 43–77)
PLATELETS: 189 10*3/uL (ref 150–400)
RBC: 4.96 MIL/uL (ref 3.87–5.11)
RDW: 13.3 % (ref 11.5–15.5)
WBC: 8.1 10*3/uL (ref 4.0–10.5)

## 2014-01-03 LAB — COMPREHENSIVE METABOLIC PANEL
ALT: 11 U/L (ref 0–35)
AST: 19 U/L (ref 0–37)
Albumin: 4.1 g/dL (ref 3.5–5.2)
Alkaline Phosphatase: 58 U/L (ref 39–117)
Anion gap: 12 (ref 5–15)
BILIRUBIN TOTAL: 0.4 mg/dL (ref 0.3–1.2)
BUN: 9 mg/dL (ref 6–23)
CHLORIDE: 101 meq/L (ref 96–112)
CO2: 25 meq/L (ref 19–32)
Calcium: 9.2 mg/dL (ref 8.4–10.5)
Creatinine, Ser: 0.85 mg/dL (ref 0.50–1.10)
GFR calc Af Amer: >90 mL/min (ref >90)
GFR, EST NON AFRICAN AMERICAN: 90 mL/min — AB (ref >90)
Glucose, Bld: 117 mg/dL — ABNORMAL HIGH (ref 70–99)
Potassium: 4 mEq/L (ref 3.7–5.3)
SODIUM: 138 meq/L (ref 137–147)
Total Protein: 7.5 g/dL (ref 6.0–8.3)

## 2014-01-03 LAB — CLOSTRIDIUM DIFFICILE BY PCR: CDIFFPCR: NEGATIVE

## 2014-01-03 LAB — POCT PREGNANCY, URINE: PREG TEST UR: NEGATIVE

## 2014-01-03 MED ORDER — ONDANSETRON 4 MG PO TBDP: 4.0000 mg | ORAL_TABLET | Freq: Three times a day (TID) | ORAL | Status: DC | PRN

## 2014-01-03 NOTE — ED Provider Notes (Signed)
CSN: [id number]     Arrival date & time 01/03/14  1441 History   First MD Initiated Contact with Patient 01/03/14 1454     Chief Complaint  Patient presents with  . Abdominal Pain   (Consider location/radiation/quality/duration/timing/severity/associated sxs/prior Treatment) HPI  Developed diarrhea and upset stomach 2 days ago. pepto and imodium w/o benefit. Watery, non-bloody. Getting worse. Associated w/ stiff neck which started yesterday. Few sips of powerade w/ some benefit. Worse w/ all food. Works in a clinical office w/ sick patients. Self induced vomiting x1. Denies fevers, emesis. Denies eating any spoiled foods or recent travel.    Past Medical History  Diagnosis Date  . Ovarian cyst   . Postpartum hemorrhage   . Normal pregnancy 03/11/2012  . SVD (spontaneous vaginal delivery) 03/11/2012   History reviewed. No pertinent past surgical history. No family history on file. History  Substance Use Topics  . Smoking status: Current Every Day Smoker -- 0.25 packs/day  . Smokeless tobacco: Not on file  . Alcohol Use: No   OB History   Grav Para Term Preterm Abortions TAB SAB Ect Mult Living   4 2 1  2  0 2   2     Review of Systems Per HPI with all other pertinent systems negative.   Allergies  Hydrocodone and Prednisone  Home Medications   Prior to Admission medications   Medication Sig Start Date End Date Taking? Authorizing Provider  Bismuth Subsalicylate (KAOPECTATE PO) Take by mouth.   Yes Historical Provider, MD  bismuth subsalicylate (PEPTO BISMOL) 262 MG/15ML suspension Take 30 mLs by mouth every 6 (six) hours as needed.   Yes Historical Provider, MD  etonogestrel (IMPLANON) 68 MG IMPL implant 1 each by Subdermal route once.   Yes Historical Provider, MD  phentermine 15 MG capsule Take 15 mg by mouth every morning.   Yes Historical Provider, MD  ibuprofen (ADVIL,MOTRIN) 800 MG tablet Take 1 tablet (800 mg total) by mouth every 6 (six) hours. 03/12/12   Jody  Bovard-Stuckert, MD  ondansetron (ZOFRAN-ODT) 4 MG disintegrating tablet Take 1 tablet (4 mg total) by mouth every 8 (eight) hours as needed for nausea or vomiting. 01/03/14   Ozella Rocksavid J Norinne Jeane, MD  oxyCODONE-acetaminophen (PERCOCET/ROXICET) 5-325 MG per tablet Take 1-2 tablets by mouth every 4 (four) hours as needed (moderate - severe pain). 03/12/12   Sherian ReinJody Bovard-Stuckert, MD  prenatal vitamin w/FE, FA (PRENATAL 1 + 1) 27-1 MG TABS Take 1 tablet by mouth daily.      Historical Provider, MD  prenatal vitamin w/FE, FA (PRENATAL 1 + 1) 27-1 MG TABS Take 1 tablet by mouth daily. 03/12/12   Jody Bovard-Stuckert, MD   BP 100/68  Pulse 90  Temp(Src) 98.8 F (37.1 C) (Oral)  Resp 18  SpO2 99% Physical Exam  Constitutional: She is oriented to person, place, and time. She appears well-developed and well-nourished.  tearful  HENT:  Head: Normocephalic and atraumatic.  Eyes: EOM are normal. Pupils are equal, round, and reactive to light.  Cardiovascular: Normal rate.   Pulmonary/Chest: Effort normal and breath sounds normal.  Abdominal:  Point tenderness at McBurny's point. Neg Murphy's sign. Hypoactive BS  Musculoskeletal: Normal range of motion.  Neurological: She is alert and oriented to person, place, and time. No cranial nerve deficit. Coordination normal.  Skin: Skin is warm.  Psychiatric: She has a normal mood and affect. Her behavior is normal. Judgment and thought content normal.    ED Course  Procedures (including critical care  time) Labs Review Labs Reviewed  CBC WITH DIFFERENTIAL - Abnormal; Notable for the following:    Neutrophils Relative % 83 (*)    Lymphocytes Relative 11 (*)    All other components within normal limits  POCT URINALYSIS DIP (DEVICE) - Abnormal; Notable for the following:    Bilirubin Urine SMALL (*)    Hgb urine dipstick LARGE (*)    Protein, ur 30 (*)    All other components within normal limits  STOOL CULTURE  CLOSTRIDIUM DIFFICILE BY PCR   COMPREHENSIVE METABOLIC PANEL  POCT PREGNANCY, URINE    Imaging Review No results found.   MDM   1. Viral gastroenteritis   CBC unremarkable. Urine NMl. Urine preg neg. Chemistry nml Pt to go to ED if worsens for eval of appendicitis Zofran  Fluids Rest Precautions given and all questions answered  Shelly Flattenavid Tennie Grussing, MD Family Medicine 01/03/2014, 5:01 PM     Ozella Rocksavid J Virgle Arth, MD 01/03/14 [phone]

## 2014-01-03 NOTE — Discharge Instructions (Signed)
You are likely experiencing a severe case of viral gastroenteritis We will call you if any of the other lab results come back and require treatment.  Please use imodium if needed for extreme diarrhea.  Please go to the emergency room if you get worse.   Viral Gastroenteritis Viral gastroenteritis is also known as stomach flu. This condition affects the stomach and intestinal tract. It can cause sudden diarrhea and vomiting. The illness typically lasts 3 to 8 days. Most people develop an immune response that eventually gets rid of the virus. While this natural response develops, the virus can make you quite ill. CAUSES  Many different viruses can cause gastroenteritis, such as rotavirus or noroviruses. You can catch one of these viruses by consuming contaminated food or water. You may also catch a virus by sharing utensils or other personal items with an infected person or by touching a contaminated surface. SYMPTOMS  The most common symptoms are diarrhea and vomiting. These problems can cause a severe loss of body fluids (dehydration) and a body salt (electrolyte) imbalance. Other symptoms may include:  Fever.  Headache.  Fatigue.  Abdominal pain. DIAGNOSIS  Your caregiver can usually diagnose viral gastroenteritis based on your symptoms and a physical exam. A stool sample may also be taken to test for the presence of viruses or other infections. TREATMENT  This illness typically goes away on its own. Treatments are aimed at rehydration. The most serious cases of viral gastroenteritis involve vomiting so severely that you are not able to keep fluids down. In these cases, fluids must be given through an intravenous line (IV). HOME CARE INSTRUCTIONS   Drink enough fluids to keep your urine clear or pale yellow. Drink small amounts of fluids frequently and increase the amounts as tolerated.  Ask your caregiver for specific rehydration instructions.  Avoid:  Foods high in  sugar.  Alcohol.  Carbonated drinks.  Tobacco.  Juice.  Caffeine drinks.  Extremely hot or cold fluids.  Fatty, greasy foods.  Too much intake of anything at one time.  Dairy products until 24 to 48 hours after diarrhea stops.  You may consume probiotics. Probiotics are active cultures of beneficial bacteria. They may lessen the amount and number of diarrheal stools in adults. Probiotics can be found in yogurt with active cultures and in supplements.  Wash your hands well to avoid spreading the virus.  Only take over-the-counter or prescription medicines for pain, discomfort, or fever as directed by your caregiver. Do not give aspirin to children. Antidiarrheal medicines are not recommended.  Ask your caregiver if you should continue to take your regular prescribed and over-the-counter medicines.  Keep all follow-up appointments as directed by your caregiver. SEEK IMMEDIATE MEDICAL CARE IF:   You are unable to keep fluids down.  You do not urinate at least once every 6 to 8 hours.  You develop shortness of breath.  You notice blood in your stool or vomit. This may look like coffee grounds.  You have abdominal pain that increases or is concentrated in one small area (localized).  You have persistent vomiting or diarrhea.  You have a fever.  The patient is a child younger than 3 months, and he or she has a fever.  The patient is a child older than 3 months, and he or she has a fever and persistent symptoms.  The patient is a child older than 3 months, and he or she has a fever and symptoms suddenly get worse.  The patient is  a baby, and he or she has no tears when crying. MAKE SURE YOU:   Understand these instructions.  Will watch your condition.  Will get help right away if you are not doing well or get worse. Document Released: 03/08/2005 Document Revised: 05/31/2011 Document Reviewed: 12/23/2010 [facility] Patient Information 2015 [employer], [place]. This  information is not intended to replace advice given to you by your health care provider. Make sure you discuss any questions you have with your health care provider.

## 2014-01-07 LAB — STOOL CULTURE

## 2014-01-16 NOTE — Telephone Encounter (Signed)
Lab was added.   ST

## 2014-01-21 ENCOUNTER — Encounter (HOSPITAL_COMMUNITY): Payer: Self-pay | Admitting: Family Medicine

## 2014-02-22 ENCOUNTER — Encounter: Payer: Self-pay | Admitting: Family Medicine

## 2014-02-22 ENCOUNTER — Ambulatory Visit (INDEPENDENT_AMBULATORY_CARE_PROVIDER_SITE_OTHER): Payer: 59 | Admitting: Family Medicine

## 2014-02-22 VITALS — BP 104/74 | HR 68 | Temp 98.2°F | Ht 65.5 in | Wt 176.4 lb

## 2014-02-22 DIAGNOSIS — Z8669 Personal history of other diseases of the nervous system and sense organs: Secondary | ICD-10-CM

## 2014-02-22 DIAGNOSIS — Z23 Encounter for immunization: Secondary | ICD-10-CM

## 2014-02-22 DIAGNOSIS — Z720 Tobacco use: Secondary | ICD-10-CM

## 2014-02-22 DIAGNOSIS — E663 Overweight: Secondary | ICD-10-CM

## 2014-02-22 DIAGNOSIS — F17200 Nicotine dependence, unspecified, uncomplicated: Secondary | ICD-10-CM

## 2014-02-22 DIAGNOSIS — Z0000 Encounter for general adult medical examination without abnormal findings: Secondary | ICD-10-CM

## 2014-02-22 DIAGNOSIS — G43009 Migraine without aura, not intractable, without status migrainosus: Secondary | ICD-10-CM

## 2014-02-22 MED ORDER — PHENTERMINE HCL 30 MG PO CAPS: 30.0000 mg | ORAL_CAPSULE | ORAL | Status: DC

## 2014-02-22 MED ORDER — SUMATRIPTAN SUCCINATE 4 MG/0.5ML ~~LOC~~ SOAJ: SUBCUTANEOUS | Status: DC

## 2014-02-22 NOTE — Patient Instructions (Signed)
Preventive Care for Adults A healthy lifestyle and preventive care can promote health and wellness. Preventive health guidelines for women include the following key practices.  A routine yearly physical is a good way to check with your health care provider about your health and preventive screening. It is a chance to share any concerns and updates on your health and to receive a thorough exam.  Visit your dentist for a routine exam and preventive care every 6 months. Brush your teeth twice a day and floss once a day. Good oral hygiene prevents tooth decay and gum disease.  The frequency of eye exams is based on your age, health, family medical history, use of contact lenses, and other factors. Follow your health care provider's recommendations for frequency of eye exams.  Eat a healthy diet. Foods like vegetables, fruits, whole grains, low-fat dairy products, and lean protein foods contain the nutrients you need without too many calories. Decrease your intake of foods high in solid fats, added sugars, and salt. Eat the right amount of calories for you.Get information about a proper diet from your health care provider, if necessary.  Regular physical exercise is one of the most important things you can do for your health. Most adults should get at least 150 minutes of moderate-intensity exercise (any activity that increases your heart rate and causes you to sweat) each week. In addition, most adults need muscle-strengthening exercises on 2 or more days a week.  Maintain a healthy weight. The body mass index (BMI) is a screening tool to identify possible weight problems. It provides an estimate of body fat based on height and weight. Your health care provider can find your BMI and can help you achieve or maintain a healthy weight.For adults 20 years and older:  A BMI below 18.5 is considered underweight.  A BMI of 18.5 to 24.9 is normal.  A BMI of 25 to 29.9 is considered overweight.  A BMI of  30 and above is considered obese.  Maintain normal blood lipids and cholesterol levels by exercising and minimizing your intake of saturated fat. Eat a balanced diet with plenty of fruit and vegetables. Blood tests for lipids and cholesterol should begin at age 76 and be repeated every 5 years. If your lipid or cholesterol levels are high, you are over 50, or you are at high risk for heart disease, you may need your cholesterol levels checked more frequently.Ongoing high lipid and cholesterol levels should be treated with medicines if diet and exercise are not working.  If you smoke, find out from your health care provider how to quit. If you do not use tobacco, do not start.  Lung cancer screening is recommended for adults aged 22-80 years who are at high risk for developing lung cancer because of a history of smoking. A yearly low-dose CT scan of the lungs is recommended for people who have at least a 30-pack-year history of smoking and are a current smoker or have quit within the past 15 years. A pack year of smoking is smoking an average of 1 pack of cigarettes a day for 1 year (for example: 1 pack a day for 30 years or 2 packs a day for 15 years). Yearly screening should continue until the smoker has stopped smoking for at least 15 years. Yearly screening should be stopped for people who develop a health problem that would prevent them from having lung cancer treatment.  If you are pregnant, do not drink alcohol. If you are breastfeeding,  be very cautious about drinking alcohol. If you are not pregnant and choose to drink alcohol, do not have more than 1 drink per day. One drink is considered to be 12 ounces (355 mL) of beer, 5 ounces (148 mL) of wine, or 1.5 ounces (44 mL) of liquor.  Avoid use of street drugs. Do not share needles with anyone. Ask for help if you need support or instructions about stopping the use of drugs.  High blood pressure causes heart disease and increases the risk of  stroke. Your blood pressure should be checked at least every 1 to 2 years. Ongoing high blood pressure should be treated with medicines if weight loss and exercise do not work.  If you are 75-52 years old, ask your health care provider if you should take aspirin to prevent strokes.  Diabetes screening involves taking a blood sample to check your fasting blood sugar level. This should be done once every 3 years, after age 15, if you are within normal weight and without risk factors for diabetes. Testing should be considered at a younger age or be carried out more frequently if you are overweight and have at least 1 risk factor for diabetes.  Breast cancer screening is essential preventive care for women. You should practice "breast self-awareness." This means understanding the normal appearance and feel of your breasts and may include breast self-examination. Any changes detected, no matter how small, should be reported to a health care provider. Women in their 58s and 30s should have a clinical breast exam (CBE) by a health care provider as part of a regular health exam every 1 to 3 years. After age 16, women should have a CBE every year. Starting at age 53, women should consider having a mammogram (breast X-ray test) every year. Women who have a family history of breast cancer should talk to their health care provider about genetic screening. Women at a high risk of breast cancer should talk to their health care providers about having an MRI and a mammogram every year.  Breast cancer gene (BRCA)-related cancer risk assessment is recommended for women who have family members with BRCA-related cancers. BRCA-related cancers include breast, ovarian, tubal, and peritoneal cancers. Having family members with these cancers may be associated with an increased risk for harmful changes (mutations) in the breast cancer genes BRCA1 and BRCA2. Results of the assessment will determine the need for genetic counseling and  BRCA1 and BRCA2 testing.  Routine pelvic exams to screen for cancer are no longer recommended for nonpregnant women who are considered low risk for cancer of the pelvic organs (ovaries, uterus, and vagina) and who do not have symptoms. Ask your health care provider if a screening pelvic exam is right for you.  If you have had past treatment for cervical cancer or a condition that could lead to cancer, you need Pap tests and screening for cancer for at least 20 years after your treatment. If Pap tests have been discontinued, your risk factors (such as having a new sexual partner) need to be reassessed to determine if screening should be resumed. Some women have medical problems that increase the chance of getting cervical cancer. In these cases, your health care provider may recommend more frequent screening and Pap tests.  The HPV test is an additional test that may be used for cervical cancer screening. The HPV test looks for the virus that can cause the cell changes on the cervix. The cells collected during the Pap test can be  tested for HPV. The HPV test could be used to screen women aged 30 years and older, and should be used in women of any age who have unclear Pap test results. After the age of 30, women should have HPV testing at the same frequency as a Pap test.  Colorectal cancer can be detected and often prevented. Most routine colorectal cancer screening begins at the age of 50 years and continues through age 75 years. However, your health care provider may recommend screening at an earlier age if you have risk factors for colon cancer. On a yearly basis, your health care provider may provide home test kits to check for hidden blood in the stool. Use of a small camera at the end of a tube, to directly examine the colon (sigmoidoscopy or colonoscopy), can detect the earliest forms of colorectal cancer. Talk to your health care provider about this at age 50, when routine screening begins. Direct  exam of the colon should be repeated every 5-10 years through age 75 years, unless early forms of pre-cancerous polyps or small growths are found.  People who are at an increased risk for hepatitis B should be screened for this virus. You are considered at high risk for hepatitis B if:  You were born in a country where hepatitis B occurs often. Talk with your health care provider about which countries are considered high risk.  Your parents were born in a high-risk country and you have not received a shot to protect against hepatitis B (hepatitis B vaccine).  You have HIV or AIDS.  You use needles to inject street drugs.  You live with, or have sex with, someone who has hepatitis B.  You get hemodialysis treatment.  You take certain medicines for conditions like cancer, organ transplantation, and autoimmune conditions.  Hepatitis C blood testing is recommended for all people born from 1945 through 1965 and any individual with known risks for hepatitis C.  Practice safe sex. Use condoms and avoid high-risk sexual practices to reduce the spread of sexually transmitted infections (STIs). STIs include gonorrhea, chlamydia, syphilis, trichomonas, herpes, HPV, and human immunodeficiency virus (HIV). Herpes, HIV, and HPV are viral illnesses that have no cure. They can result in disability, cancer, and death.  You should be screened for sexually transmitted illnesses (STIs) including gonorrhea and chlamydia if:  You are sexually active and are younger than 24 years.  You are older than 24 years and your health care provider tells you that you are at risk for this type of infection.  Your sexual activity has changed since you were last screened and you are at an increased risk for chlamydia or gonorrhea. Ask your health care provider if you are at risk.  If you are at risk of being infected with HIV, it is recommended that you take a prescription medicine daily to prevent HIV infection. This is  called preexposure prophylaxis (PrEP). You are considered at risk if:  You are a heterosexual woman, are sexually active, and are at increased risk for HIV infection.  You take drugs by injection.  You are sexually active with a partner who has HIV.  Talk with your health care provider about whether you are at high risk of being infected with HIV. If you choose to begin PrEP, you should first be tested for HIV. You should then be tested every 3 months for as long as you are taking PrEP.  Osteoporosis is a disease in which the bones lose minerals and strength   with aging. This can result in serious bone fractures or breaks. The risk of osteoporosis can be identified using a bone density scan. Women ages 65 years and over and women at risk for fractures or osteoporosis should discuss screening with their health care providers. Ask your health care provider whether you should take a calcium supplement or vitamin D to reduce the rate of osteoporosis.  Menopause can be associated with physical symptoms and risks. Hormone replacement therapy is available to decrease symptoms and risks. You should talk to your health care provider about whether hormone replacement therapy is right for you.  Use sunscreen. Apply sunscreen liberally and repeatedly throughout the day. You should seek shade when your shadow is shorter than you. Protect yourself by wearing long sleeves, pants, a wide-brimmed hat, and sunglasses year round, whenever you are outdoors.  Once a month, do a whole body skin exam, using a mirror to look at the skin on your back. Tell your health care provider of new moles, moles that have irregular borders, moles that are larger than a pencil eraser, or moles that have changed in shape or color.  Stay current with required vaccines (immunizations).  Influenza vaccine. All adults should be immunized every year.  Tetanus, diphtheria, and acellular pertussis (Td, Tdap) vaccine. Pregnant women should  receive 1 dose of Tdap vaccine during each pregnancy. The dose should be obtained regardless of the length of time since the last dose. Immunization is preferred during the 27th-36th week of gestation. An adult who has not previously received Tdap or who does not know her vaccine status should receive 1 dose of Tdap. This initial dose should be followed by tetanus and diphtheria toxoids (Td) booster doses every 10 years. Adults with an unknown or incomplete history of completing a 3-dose immunization series with Td-containing vaccines should begin or complete a primary immunization series including a Tdap dose. Adults should receive a Td booster every 10 years.  Varicella vaccine. An adult without evidence of immunity to varicella should receive 2 doses or a second dose if she has previously received 1 dose. Pregnant females who do not have evidence of immunity should receive the first dose after pregnancy. This first dose should be obtained before leaving the health care facility. The second dose should be obtained 4-8 weeks after the first dose.  Human papillomavirus (HPV) vaccine. Females aged 13-26 years who have not received the vaccine previously should obtain the 3-dose series. The vaccine is not recommended for use in pregnant females. However, pregnancy testing is not needed before receiving a dose. If a female is found to be pregnant after receiving a dose, no treatment is needed. In that case, the remaining doses should be delayed until after the pregnancy. Immunization is recommended for any person with an immunocompromised condition through the age of 26 years if she did not get any or all doses earlier. During the 3-dose series, the second dose should be obtained 4-8 weeks after the first dose. The third dose should be obtained 24 weeks after the first dose and 16 weeks after the second dose.  Zoster vaccine. One dose is recommended for adults aged 60 years or older unless certain conditions are  present.  Measles, mumps, and rubella (MMR) vaccine. Adults born before 1957 generally are considered immune to measles and mumps. Adults born in 1957 or later should have 1 or more doses of MMR vaccine unless there is a contraindication to the vaccine or there is laboratory evidence of immunity to   each of the three diseases. A routine second dose of MMR vaccine should be obtained at least 28 days after the first dose for students attending postsecondary schools, health care workers, or international travelers. People who received inactivated measles vaccine or an unknown type of measles vaccine during 1963-1967 should receive 2 doses of MMR vaccine. People who received inactivated mumps vaccine or an unknown type of mumps vaccine before 1979 and are at high risk for mumps infection should consider immunization with 2 doses of MMR vaccine. For females of childbearing age, rubella immunity should be determined. If there is no evidence of immunity, females who are not pregnant should be vaccinated. If there is no evidence of immunity, females who are pregnant should delay immunization until after pregnancy. Unvaccinated health care workers born before 1957 who lack laboratory evidence of measles, mumps, or rubella immunity or laboratory confirmation of disease should consider measles and mumps immunization with 2 doses of MMR vaccine or rubella immunization with 1 dose of MMR vaccine.  Pneumococcal 13-valent conjugate (PCV13) vaccine. When indicated, a person who is uncertain of her immunization history and has no record of immunization should receive the PCV13 vaccine. An adult aged 19 years or older who has certain medical conditions and has not been previously immunized should receive 1 dose of PCV13 vaccine. This PCV13 should be followed with a dose of pneumococcal polysaccharide (PPSV23) vaccine. The PPSV23 vaccine dose should be obtained at least 8 weeks after the dose of PCV13 vaccine. An adult aged 19  years or older who has certain medical conditions and previously received 1 or more doses of PPSV23 vaccine should receive 1 dose of PCV13. The PCV13 vaccine dose should be obtained 1 or more years after the last PPSV23 vaccine dose.  Pneumococcal polysaccharide (PPSV23) vaccine. When PCV13 is also indicated, PCV13 should be obtained first. All adults aged 65 years and older should be immunized. An adult younger than age 65 years who has certain medical conditions should be immunized. Any person who resides in a nursing home or long-term care facility should be immunized. An adult smoker should be immunized. People with an immunocompromised condition and certain other conditions should receive both PCV13 and PPSV23 vaccines. People with human immunodeficiency virus (HIV) infection should be immunized as soon as possible after diagnosis. Immunization during chemotherapy or radiation therapy should be avoided. Routine use of PPSV23 vaccine is not recommended for American Indians, [place] Natives, or people younger than 65 years unless there are medical conditions that require PPSV23 vaccine. When indicated, people who have unknown immunization and have no record of immunization should receive PPSV23 vaccine. One-time revaccination 5 years after the first dose of PPSV23 is recommended for people aged 19-64 years who have chronic kidney failure, nephrotic syndrome, asplenia, or immunocompromised conditions. People who received 1-2 doses of PPSV23 before age 65 years should receive another dose of PPSV23 vaccine at age 65 years or later if at least 5 years have passed since the previous dose. Doses of PPSV23 are not needed for people immunized with PPSV23 at or after age 65 years.  Meningococcal vaccine. Adults with asplenia or persistent complement component deficiencies should receive 2 doses of quadrivalent meningococcal conjugate (MenACWY-D) vaccine. The doses should be obtained at least 2 months apart.  Microbiologists working with certain meningococcal bacteria, military recruits, people at risk during an outbreak, and people who travel to or live in countries with a high rate of meningitis should be immunized. A first-year college student up through age   21 years who is living in a residence hall should receive a dose if she did not receive a dose on or after her 16th birthday. Adults who have certain high-risk conditions should receive one or more doses of vaccine.  Hepatitis A vaccine. Adults who wish to be protected from this disease, have certain high-risk conditions, work with hepatitis A-infected animals, work in hepatitis A research labs, or travel to or work in countries with a high rate of hepatitis A should be immunized. Adults who were previously unvaccinated and who anticipate close contact with an international adoptee during the first 60 days after arrival in the [place] from a country with a high rate of hepatitis A should be immunized.  Hepatitis B vaccine. Adults who wish to be protected from this disease, have certain high-risk conditions, may be exposed to blood or other infectious body fluids, are household contacts or sex partners of hepatitis B positive people, are clients or workers in certain care facilities, or travel to or work in countries with a high rate of hepatitis B should be immunized.  Haemophilus influenzae type b (Hib) vaccine. A previously unvaccinated person with asplenia or sickle cell disease or having a scheduled splenectomy should receive 1 dose of Hib vaccine. Regardless of previous immunization, a recipient of a hematopoietic stem cell transplant should receive a 3-dose series 6-12 months after her successful transplant. Hib vaccine is not recommended for adults with HIV infection. Preventive Services / Frequency Ages 64 to 68 years  Blood pressure check.** / Every 1 to 2 years.  Lipid and cholesterol check.** / Every 5 years beginning at age  22.  Clinical breast exam.** / Every 3 years for women in their 88s and 53s.  BRCA-related cancer risk assessment.** / For women who have family members with a BRCA-related cancer (breast, ovarian, tubal, or peritoneal cancers).  Pap test.** / Every 2 years from ages 90 through 51. Every 3 years starting at age 21 through age 56 or 3 with a history of 3 consecutive normal Pap tests.  HPV screening.** / Every 3 years from ages 24 through ages 1 to 46 with a history of 3 consecutive normal Pap tests.  Hepatitis C blood test.** / For any individual with known risks for hepatitis C.  Skin self-exam. / Monthly.  Influenza vaccine. / Every year.  Tetanus, diphtheria, and acellular pertussis (Tdap, Td) vaccine.** / Consult your health care provider. Pregnant women should receive 1 dose of Tdap vaccine during each pregnancy. 1 dose of Td every 10 years.  Varicella vaccine.** / Consult your health care provider. Pregnant females who do not have evidence of immunity should receive the first dose after pregnancy.  HPV vaccine. / 3 doses over 6 months, if 72 and younger. The vaccine is not recommended for use in pregnant females. However, pregnancy testing is not needed before receiving a dose.  Measles, mumps, rubella (MMR) vaccine.** / You need at least 1 dose of MMR if you were born in 1957 or later. You may also need a 2nd dose. For females of childbearing age, rubella immunity should be determined. If there is no evidence of immunity, females who are not pregnant should be vaccinated. If there is no evidence of immunity, females who are pregnant should delay immunization until after pregnancy.  Pneumococcal 13-valent conjugate (PCV13) vaccine.** / Consult your health care provider.  Pneumococcal polysaccharide (PPSV23) vaccine.** / 1 to 2 doses if you smoke cigarettes or if you have certain conditions.  Meningococcal vaccine.** /  1 dose if you are age 19 to 21 years and a first-year college  student living in a residence hall, or have one of several medical conditions, you need to get vaccinated against meningococcal disease. You may also need additional booster doses.  Hepatitis A vaccine.** / Consult your health care provider.  Hepatitis B vaccine.** / Consult your health care provider.  Haemophilus influenzae type b (Hib) vaccine.** / Consult your health care provider. Ages 40 to 64 years  Blood pressure check.** / Every 1 to 2 years.  Lipid and cholesterol check.** / Every 5 years beginning at age 20 years.  Lung cancer screening. / Every year if you are aged 55-80 years and have a 30-pack-year history of smoking and currently smoke or have quit within the past 15 years. Yearly screening is stopped once you have quit smoking for at least 15 years or develop a health problem that would prevent you from having lung cancer treatment.  Clinical breast exam.** / Every year after age 40 years.  BRCA-related cancer risk assessment.** / For women who have family members with a BRCA-related cancer (breast, ovarian, tubal, or peritoneal cancers).  Mammogram.** / Every year beginning at age 40 years and continuing for as long as you are in good health. Consult with your health care provider.  Pap test.** / Every 3 years starting at age 30 years through age 65 or 70 years with a history of 3 consecutive normal Pap tests.  HPV screening.** / Every 3 years from ages 30 years through ages 65 to 70 years with a history of 3 consecutive normal Pap tests.  Fecal occult blood test (FOBT) of stool. / Every year beginning at age 50 years and continuing until age 75 years. You may not need to do this test if you get a colonoscopy every 10 years.  Flexible sigmoidoscopy or colonoscopy.** / Every 5 years for a flexible sigmoidoscopy or every 10 years for a colonoscopy beginning at age 50 years and continuing until age 75 years.  Hepatitis C blood test.** / For all people born from 1945 through  1965 and any individual with known risks for hepatitis C.  Skin self-exam. / Monthly.  Influenza vaccine. / Every year.  Tetanus, diphtheria, and acellular pertussis (Tdap/Td) vaccine.** / Consult your health care provider. Pregnant women should receive 1 dose of Tdap vaccine during each pregnancy. 1 dose of Td every 10 years.  Varicella vaccine.** / Consult your health care provider. Pregnant females who do not have evidence of immunity should receive the first dose after pregnancy.  Zoster vaccine.** / 1 dose for adults aged 60 years or older.  Measles, mumps, rubella (MMR) vaccine.** / You need at least 1 dose of MMR if you were born in 1957 or later. You may also need a 2nd dose. For females of childbearing age, rubella immunity should be determined. If there is no evidence of immunity, females who are not pregnant should be vaccinated. If there is no evidence of immunity, females who are pregnant should delay immunization until after pregnancy.  Pneumococcal 13-valent conjugate (PCV13) vaccine.** / Consult your health care provider.  Pneumococcal polysaccharide (PPSV23) vaccine.** / 1 to 2 doses if you smoke cigarettes or if you have certain conditions.  Meningococcal vaccine.** / Consult your health care provider.  Hepatitis A vaccine.** / Consult your health care provider.  Hepatitis B vaccine.** / Consult your health care provider.  Haemophilus influenzae type b (Hib) vaccine.** / Consult your health care provider. Ages 65   years and over  Blood pressure check.** / Every 1 to 2 years.  Lipid and cholesterol check.** / Every 5 years beginning at age 22 years.  Lung cancer screening. / Every year if you are aged 73-80 years and have a 30-pack-year history of smoking and currently smoke or have quit within the past 15 years. Yearly screening is stopped once you have quit smoking for at least 15 years or develop a health problem that would prevent you from having lung cancer  treatment.  Clinical breast exam.** / Every year after age 4 years.  BRCA-related cancer risk assessment.** / For women who have family members with a BRCA-related cancer (breast, ovarian, tubal, or peritoneal cancers).  Mammogram.** / Every year beginning at age 40 years and continuing for as long as you are in good health. Consult with your health care provider.  Pap test.** / Every 3 years starting at age 9 years through age 34 or 91 years with 3 consecutive normal Pap tests. Testing can be stopped between 65 and 70 years with 3 consecutive normal Pap tests and no abnormal Pap or HPV tests in the past 10 years.  HPV screening.** / Every 3 years from ages 57 years through ages 64 or 45 years with a history of 3 consecutive normal Pap tests. Testing can be stopped between 65 and 70 years with 3 consecutive normal Pap tests and no abnormal Pap or HPV tests in the past 10 years.  Fecal occult blood test (FOBT) of stool. / Every year beginning at age 15 years and continuing until age 17 years. You may not need to do this test if you get a colonoscopy every 10 years.  Flexible sigmoidoscopy or colonoscopy.** / Every 5 years for a flexible sigmoidoscopy or every 10 years for a colonoscopy beginning at age 86 years and continuing until age 71 years.  Hepatitis C blood test.** / For all people born from [birth year] through 1965 and any individual with known risks for hepatitis C.  Osteoporosis screening.** / A one-time screening for women ages 83 years and over and women at risk for fractures or osteoporosis.  Skin self-exam. / Monthly.  Influenza vaccine. / Every year.  Tetanus, diphtheria, and acellular pertussis (Tdap/Td) vaccine.** / 1 dose of Td every 10 years.  Varicella vaccine.** / Consult your health care provider.  Zoster vaccine.** / 1 dose for adults aged 61 years or older.  Pneumococcal 13-valent conjugate (PCV13) vaccine.** / Consult your health care provider.  Pneumococcal  polysaccharide (PPSV23) vaccine.** / 1 dose for all adults aged 28 years and older.  Meningococcal vaccine.** / Consult your health care provider.  Hepatitis A vaccine.** / Consult your health care provider.  Hepatitis B vaccine.** / Consult your health care provider.  Haemophilus influenzae type b (Hib) vaccine.** / Consult your health care provider. ** Family history and personal history of risk and conditions may change your health care provider's recommendations. Document Released: 05/04/2001 Document Revised: 07/23/2013 Document Reviewed: 08/03/2010 [facility] Patient Information 2015 [place], [place]. This information is not intended to replace advice given to you by your health care provider. Make sure you discuss any questions you have with your health care provider.

## 2014-02-22 NOTE — Progress Notes (Signed)
Pre visit review using our clinic review tool, if applicable. No additional management support is needed unless otherwise documented below in the visit note. 

## 2014-02-23 DIAGNOSIS — G43909 Migraine, unspecified, not intractable, without status migrainosus: Secondary | ICD-10-CM | POA: Insufficient documentation

## 2014-02-23 DIAGNOSIS — F17200 Nicotine dependence, unspecified, uncomplicated: Secondary | ICD-10-CM | POA: Insufficient documentation

## 2014-02-23 NOTE — Progress Notes (Signed)
Subjective:     Christina BecketStefannie [name] is a 33 y.o. female and is here for a comprehensive physical exam. The patient reports no problems.  History   Social History  . Marital Status: Divorced    Spouse Name: N/A    Number of Children: N/A  . Years of Education: N/A   Occupational History  . cma  [employer]   Social History Main Topics  . Smoking status: Current Every Day Smoker -- 0.50 packs/day for 20 years    Types: Cigarettes  . Smokeless tobacco: Not on file  . Alcohol Use: No  . Drug Use: No  . Sexual Activity:    Partners: Male   Other Topics Concern  . Not on file   Social History Narrative   Health Maintenance  Topic Date Due  . PAP SMEAR  09/20/2014  . INFLUENZA VACCINE  10/21/2014  . TETANUS/TDAP  03/21/2023    The following portions of the patient's history were reviewed and updated as appropriate:  She  has a past medical history of Ovarian cyst; Postpartum hemorrhage; Normal pregnancy (03/11/2012); SVD (spontaneous vaginal delivery) (03/11/2012); and Migraine. She  does not have any pertinent problems on file. She  has no past surgical history on file. Her family history includes Arthritis in her father, maternal grandfather, and maternal grandmother; Bipolar disorder in her mother; Breast cancer in her maternal grandmother; Heart disease in her maternal grandfather and maternal grandmother; Hyperlipidemia in her maternal grandfather and paternal grandmother; Lung cancer in her maternal grandmother; Stroke in her paternal grandfather; Sudden death (age of onset: 7942) in her father. She  reports that she has been smoking Cigarettes.  She has a 10 pack-year smoking history. She does not have any smokeless tobacco history on file. She reports that she does not drink alcohol or use illicit drugs. She has a current medication list which includes the following prescription(s): etonogestrel, multivitamin, phentermine, and sumatriptan succinate. Current Outpatient  Prescriptions on File Prior to Visit  Medication Sig Dispense Refill  . etonogestrel (IMPLANON) 68 MG IMPL implant 1 each by Subdermal route once.     No current facility-administered medications on file prior to visit.   She is allergic to hydrocodone and prednisone..  Review of Systems Review of Systems  Constitutional: Negative for activity change, appetite change and fatigue.  HENT: Negative for hearing loss, congestion, tinnitus and ear discharge.  dentist [id number] Eyes: Negative for visual disturbance (see optho q1y -- vision corrected to 20/20 with glasses).  Respiratory: Negative for cough, chest tightness and shortness of breath.   Cardiovascular: Negative for chest pain, palpitations and leg swelling.  Gastrointestinal: Negative for abdominal pain, diarrhea, constipation and abdominal distention.  Genitourinary: Negative for urgency, frequency, decreased urine volume and difficulty urinating.  Musculoskeletal: Negative for back pain, arthralgias and gait problem.  Skin: Negative for color change, pallor and rash.  Neurological: Negative for dizziness, light-headedness, numbness and headaches.  Hematological: Negative for adenopathy. Does not bruise/bleed easily.  Psychiatric/Behavioral: Negative for suicidal ideas, confusion, sleep disturbance, self-injury, dysphoric mood, decreased concentration and agitation.       Objective:    BP 104/74 mmHg  Pulse 68  Temp(Src) 98.2 F (36.8 C) (Oral)  Ht 5' 5.5" (1.664 m)  Wt 176 lb 6.4 oz (80.015 kg)  BMI 28.90 kg/m2  SpO2 100%  LMP 12/17/2013 General appearance: alert, cooperative, appears stated age and no distress Head: Normocephalic, without obvious abnormality, atraumatic Eyes: conjunctivae/corneas clear. PERRL, EOM's intact. Fundi benign. Ears: normal TM's  and external ear canals both ears Nose: Nares normal. Septum midline. Mucosa normal. No drainage or sinus tenderness. Throat: lips, mucosa, and tongue normal; teeth and  gums normal Neck: no adenopathy, no carotid bruit, no JVD, supple, symmetrical, trachea midline and thyroid not enlarged, symmetric, no tenderness/mass/nodules Back: symmetric, no curvature. ROM normal. No CVA tenderness. Lungs: clear to auscultation bilaterally Breasts: normal appearance, no masses or tenderness Heart: regular rate and rhythm, S1, S2 normal, no murmur, click, rub or gallop Abdomen: soft, non-tender; bowel sounds normal; no masses,  no organomegaly Pelvic: deferred Extremities: extremities normal, atraumatic, no cyanosis or edema Pulses: 2+ and symmetric Skin: Skin color, texture, turgor normal. No rashes or lesions Lymph nodes: Cervical, supraclavicular, and axillary nodes normal. Neurologic: Alert and oriented X 3, normal strength and tone. Normal symmetric reflexes. Normal coordination and gait Psych-- no depression, no anxiety      Assessment:    Healthy female exam.       Plan:    ghm utd Check labs See After Visit Summary for Counseling Recommendations    1. Hx of migraines   - SUMAtriptan Succinate 4 MG/0.5ML SOAJ; As directed  Dispense: 2 cartridge; Refill: 2  2. Preventative health care    3. Overweight   - phentermine 30 MG capsule; Take 1 capsule (30 mg total) by mouth every morning.  Dispense: 30 capsule; Refill: 0  4. Immunization due   - Hepatitis A vaccine adult IM

## 2014-02-23 NOTE — Assessment & Plan Note (Signed)
Pt is trying to cut down

## 2014-02-25 ENCOUNTER — Telehealth: Payer: Self-pay | Admitting: Family Medicine

## 2014-02-25 ENCOUNTER — Other Ambulatory Visit: Payer: Self-pay

## 2014-02-25 NOTE — Telephone Encounter (Signed)
Pharmacy called for clarification on the Sumatriptan Succinate.      KP

## 2014-02-25 NOTE — Telephone Encounter (Signed)
emmi emailed °

## 2014-03-28 ENCOUNTER — Telehealth: Payer: Self-pay | Admitting: *Deleted

## 2014-03-28 MED ORDER — AMOXICILLIN-POT CLAVULANATE 875-125 MG PO TABS: 1.0000 | ORAL_TABLET | Freq: Two times a day (BID) | ORAL | Status: DC

## 2014-03-28 NOTE — Telephone Encounter (Signed)
Pt informed

## 2014-03-28 NOTE — Telephone Encounter (Signed)
Start augmentin 

## 2014-03-28 NOTE — Telephone Encounter (Signed)
Pt c/o productive with green/gray sputum x 2 days and thoracic back pain x 1 day. Rt eye Sinus pressure/congestion x 5 days. Pt requesting Abx. Please advise.

## 2014-04-02 ENCOUNTER — Encounter: Payer: Self-pay | Admitting: Family Medicine

## 2014-04-09 ENCOUNTER — Ambulatory Visit (INDEPENDENT_AMBULATORY_CARE_PROVIDER_SITE_OTHER): Payer: 59 | Admitting: Family Medicine

## 2014-04-09 ENCOUNTER — Encounter: Payer: Self-pay | Admitting: Family Medicine

## 2014-04-09 ENCOUNTER — Other Ambulatory Visit (HOSPITAL_COMMUNITY)
Admission: RE | Admit: 2014-04-09 | Discharge: 2014-04-09 | Disposition: A | Discharge status: Home / Self Care | Payer: 59 | Source: Ambulatory Visit | Attending: Family Medicine | Admitting: Family Medicine

## 2014-04-09 VITALS — BP 112/76 | HR 87 | Temp 98.2°F | Resp 16 | Wt 172.8 lb

## 2014-04-09 DIAGNOSIS — N760 Acute vaginitis: Secondary | ICD-10-CM | POA: Diagnosis present

## 2014-04-09 DIAGNOSIS — Z113 Encounter for screening for infections with a predominantly sexual mode of transmission: Secondary | ICD-10-CM | POA: Insufficient documentation

## 2014-04-09 DIAGNOSIS — N898 Other specified noninflammatory disorders of vagina: Secondary | ICD-10-CM

## 2014-04-09 NOTE — Progress Notes (Signed)
  Subjective:    Christina [name] is a 34 y.o. female who presents for sexually transmitted disease check. Sexual history reviewed with the patient. STI Exposure: denies knowledge of risky exposure. Previous history of STI none. Current symptoms vaginal discharge: copious and mucoid. Contraception: Nexplanon Menstrual History: OB History    Gravida Para Term Preterm AB TAB SAB Ectopic Multiple Living   4 2 1  2  0 2   2       No LMP recorded. Patient has had an implant.    The following portions of the patient's history were reviewed and updated as appropriate: allergies, current medications, past family history, past medical history, past social history, past surgical history and problem list.  Review of Systems Pertinent items are noted in HPI.    Objective:    BP 112/76 mmHg  Pulse 87  Temp(Src) 98.2 F (36.8 C) (Oral)  Resp 16  Wt 172 lb 12.8 oz (78.382 kg)  SpO2 97% General:   alert, cooperative, appears stated age and no distress  Lymph Nodes:   Cervical, supraclavicular, and axillary nodes normal.  Pelvis:  External genitalia: normal general appearance Vaginal: normal mucosa without prolapse or lesions and discharge, white  Cultures:  GC and Chlamydia genprobes and bacterial culture     Assessment:  Vaginal d/c---  Very low risk of STD exposure   Plan:    Will call pt with results May be from implanon

## 2014-04-09 NOTE — Progress Notes (Signed)
Pre visit review using our clinic review tool, if applicable. No additional management support is needed unless otherwise documented below in the visit note. 

## 2014-04-11 ENCOUNTER — Telehealth: Payer: Self-pay

## 2014-04-11 ENCOUNTER — Other Ambulatory Visit: Payer: Self-pay | Admitting: Family Medicine

## 2014-04-11 DIAGNOSIS — N760 Acute vaginitis: Secondary | ICD-10-CM

## 2014-04-11 LAB — CERVICOVAGINAL ANCILLARY ONLY
CHLAMYDIA, DNA PROBE: NEGATIVE
NEISSERIA GONORRHEA: NEGATIVE
WET PREP (BD AFFIRM): NEGATIVE
WET PREP (BD AFFIRM): NEGATIVE
WET PREP (BD AFFIRM): POSITIVE — AB

## 2014-04-11 MED ORDER — FLUCONAZOLE 150 MG PO TABS: 150.0000 mg | ORAL_TABLET | Freq: Once | ORAL | Status: DC

## 2014-04-11 NOTE — Telephone Encounter (Signed)
patient has been made aware and Rx has been faxed.      KP

## 2014-04-11 NOTE — Telephone Encounter (Signed)
-----   Message from Lelon PerlaYvonne R Lowne, DO sent at 04/11/2014  8:15 AM EST ----- + yeast-- diflucan 150 mg 1 po x1, may repeat in 3 days prn #2

## 2014-04-23 ENCOUNTER — Other Ambulatory Visit: Payer: Self-pay | Admitting: Family Medicine

## 2014-04-23 DIAGNOSIS — E663 Overweight: Secondary | ICD-10-CM

## 2014-04-24 ENCOUNTER — Encounter: Payer: Self-pay | Admitting: Family Medicine

## 2014-04-25 ENCOUNTER — Other Ambulatory Visit: Payer: Self-pay | Admitting: Family Medicine

## 2014-05-14 ENCOUNTER — Ambulatory Visit: Payer: 59 | Admitting: Family Medicine

## 2014-08-28 ENCOUNTER — Ambulatory Visit (INDEPENDENT_AMBULATORY_CARE_PROVIDER_SITE_OTHER): Payer: 59 | Admitting: Internal Medicine

## 2014-08-28 ENCOUNTER — Encounter: Payer: Self-pay | Admitting: Internal Medicine

## 2014-08-28 ENCOUNTER — Other Ambulatory Visit: Payer: Self-pay

## 2014-08-28 ENCOUNTER — Telehealth: Payer: Self-pay

## 2014-08-28 VITALS — BP 108/70 | HR 69 | Temp 98.5°F | Resp 16 | Ht 65.0 in | Wt 190.0 lb

## 2014-08-28 DIAGNOSIS — Z0000 Encounter for general adult medical examination without abnormal findings: Secondary | ICD-10-CM

## 2014-08-28 DIAGNOSIS — E669 Obesity, unspecified: Secondary | ICD-10-CM | POA: Diagnosis not present

## 2014-08-28 DIAGNOSIS — G43001 Migraine without aura, not intractable, with status migrainosus: Secondary | ICD-10-CM | POA: Diagnosis not present

## 2014-08-28 DIAGNOSIS — R739 Hyperglycemia, unspecified: Secondary | ICD-10-CM | POA: Insufficient documentation

## 2014-08-28 DIAGNOSIS — E663 Overweight: Secondary | ICD-10-CM | POA: Diagnosis not present

## 2014-08-28 DIAGNOSIS — Z8669 Personal history of other diseases of the nervous system and sense organs: Secondary | ICD-10-CM

## 2014-08-28 DIAGNOSIS — E66811 Obesity, class 1: Secondary | ICD-10-CM | POA: Insufficient documentation

## 2014-08-28 MED ORDER — PHENTERMINE HCL 30 MG PO CAPS: 30.0000 mg | ORAL_CAPSULE | ORAL | Status: DC

## 2014-08-28 MED ORDER — SUMATRIPTAN SUCCINATE 4 MG/0.5ML ~~LOC~~ SOAJ: SUBCUTANEOUS | Status: DC

## 2014-08-28 NOTE — Telephone Encounter (Signed)
Received pharmacy rejection stating that insurance will not cover phentermine 30  without a prior authorization. PA submitted to covermymeds, decision now pending their approval.

## 2014-08-28 NOTE — Patient Instructions (Signed)

## 2014-08-28 NOTE — Progress Notes (Signed)
Pre visit review using our clinic review tool, if applicable. No additional management support is needed unless otherwise documented below in the visit note. 

## 2014-08-29 ENCOUNTER — Encounter: Payer: Self-pay | Admitting: Internal Medicine

## 2014-08-29 ENCOUNTER — Other Ambulatory Visit (INDEPENDENT_AMBULATORY_CARE_PROVIDER_SITE_OTHER): Payer: 59

## 2014-08-29 DIAGNOSIS — Z0000 Encounter for general adult medical examination without abnormal findings: Secondary | ICD-10-CM | POA: Insufficient documentation

## 2014-08-29 DIAGNOSIS — R739 Hyperglycemia, unspecified: Secondary | ICD-10-CM

## 2014-08-29 LAB — HEMOGLOBIN A1C: HEMOGLOBIN A1C: 5.5 % (ref 4.6–6.5)

## 2014-08-29 LAB — BASIC METABOLIC PANEL
BUN: 10 mg/dL (ref 6–23)
CO2: 26 meq/L (ref 19–32)
Calcium: 9.3 mg/dL (ref 8.4–10.5)
Chloride: 107 mEq/L (ref 96–112)
Creatinine, Ser: 0.78 mg/dL (ref 0.40–1.20)
GFR: 90.07 mL/min (ref >60.00)
Glucose, Bld: 82 mg/dL (ref 70–99)
POTASSIUM: 3.7 meq/L (ref 3.5–5.1)
SODIUM: 139 meq/L (ref 135–145)

## 2014-08-29 NOTE — Progress Notes (Signed)
Subjective:  Patient ID: Christina [name], female    DOB: 1980-09-12  Age: 34 y.o. MRN: [id number]  CC: Annual Exam   HPI Christina [name] presents for a CPX and she wants to restart phentermine for obesity, she has gained weight recently. She also needs a f/up on a mildly elevated blood sugar.   Outpatient Prescriptions Prior to Visit  Medication Sig Dispense Refill  . etonogestrel (IMPLANON) 68 MG IMPL implant 1 each by Subdermal route once.    . SUMAtriptan Succinate 4 MG/0.5ML SOAJ As directed 2 cartridge 2  . fluconazole (DIFLUCAN) 150 MG tablet Take 1 tablet (150 mg total) by mouth once. May repeat in 3 days prn (Patient not taking: Reported on 08/28/2014) 2 tablet 0  . Multiple Vitamin (MULTIVITAMIN) tablet Take 1 tablet by mouth daily.    . phentermine 30 MG capsule Take 1 capsule (30 mg total) by mouth every morning. (Patient not taking: Reported on 08/28/2014) 30 capsule 0   No facility-administered medications prior to visit.    ROS Review of Systems  Constitutional: Positive for unexpected weight change. Negative for fever, chills, diaphoresis, appetite change and fatigue.  HENT: Negative.   Eyes: Negative.   Respiratory: Negative.  Negative for cough, choking, chest tightness, shortness of breath and stridor.   Cardiovascular: Negative.  Negative for chest pain and leg swelling.  Gastrointestinal: Negative.  Negative for abdominal pain.  Endocrine: Negative.  Negative for polydipsia, polyphagia and polyuria.  Genitourinary: Negative.   Musculoskeletal: Negative.   Skin: Negative.  Negative for rash.  Allergic/Immunologic: Negative.   Neurological: Negative.  Negative for dizziness, tremors, syncope, light-headedness and numbness.  Hematological: Negative.  Negative for adenopathy. Does not bruise/bleed easily.  Psychiatric/Behavioral: Negative.     Objective:  BP 108/70 mmHg  Pulse 69  Temp(Src) 98.5 F (36.9 C) (Oral)  Ht  (1.651 m)  Wt 190 lb  (86.183 kg)  BMI 31.62 kg/m2  SpO2 97%  LMP 08/22/2014 (Exact Date)  BP Readings from Last 3 Encounters:  08/28/14 108/70  04/09/14 112/76  02/22/14 104/74    Wt Readings from Last 3 Encounters:  08/28/14 190 lb (86.183 kg)  04/09/14 172 lb 12.8 oz (78.382 kg)  02/22/14 176 lb 6.4 oz (80.015 kg)    Physical Exam  Constitutional: She is oriented to person, place, and time. She appears well-developed and well-nourished. No distress.  HENT:  Head: Normocephalic and atraumatic.  Mouth/Throat: Oropharynx is clear and moist. No oropharyngeal exudate.  Eyes: Conjunctivae are normal. Right eye exhibits no discharge. Left eye exhibits no discharge. No scleral icterus.  Neck: Normal range of motion. Neck supple. No JVD present. No tracheal deviation present. No thyromegaly present.  Cardiovascular: Normal rate, regular rhythm, normal heart sounds and intact distal pulses.  Exam reveals no gallop and no friction rub.   No murmur heard. Pulmonary/Chest: Effort normal and breath sounds normal. No stridor. No respiratory distress. She has no wheezes. She has no rales. She exhibits no tenderness.  Abdominal: Soft. Bowel sounds are normal. She exhibits no distension and no mass. There is no tenderness. There is no rebound and no guarding.  Musculoskeletal: Normal range of motion. She exhibits no edema or tenderness.  Lymphadenopathy:    She has no cervical adenopathy.  Neurological: She is oriented to person, place, and time.  Skin: Skin is warm and dry. No rash noted. She is not diaphoretic. No erythema. No pallor.  Psychiatric: She has a normal mood and affect. Her behavior  is normal. Judgment and thought content normal.  Vitals reviewed.   Lab Results  Component Value Date   WBC 8.1 01/03/2014   HGB 14.9 01/03/2014   HCT 43.2 01/03/2014   PLT 189 01/03/2014   GLUCOSE 117* 01/03/2014   CHOL 157 01/02/2014   TRIG 50.0 01/02/2014   HDL 41.70 01/02/2014   LDLCALC 105* 01/02/2014   ALT  11 01/03/2014   AST 19 01/03/2014   NA 138 01/03/2014   K 4.0 01/03/2014   CL 101 01/03/2014   CREATININE 0.85 01/03/2014   BUN 9 01/03/2014   CO2 25 01/03/2014   TSH 0.89 01/01/2014    No results found.  Assessment & Plan:   [name] was seen today for annual exam.  Diagnoses and all orders for this visit:  Hx of migraines  Obesity (BMI 30.0-34.9) - she will work on her lifestyle modifications, will restart phentermine  Migraine without aura and with status migrainosus, not intractable Orders: -     Discontinue: SUMAtriptan Succinate 4 MG/0.5ML SOAJ; As directed  Overweight Orders: -     phentermine 30 MG capsule; Take 1 capsule (30 mg total) by mouth every morning.  Hyperglycemia Orders: -     Basic metabolic panel; Future -     Hemoglobin A1c; Future   I have discontinued Ms. Cairrikier's SUMAtriptan Succinate. I am also having her maintain her etonogestrel, multivitamin, fluconazole, and phentermine.  Meds ordered this encounter  Medications  . DISCONTD: SUMAtriptan Succinate 4 MG/0.5ML SOAJ    Sig: As directed    Dispense:  2 cartridge    Refill:  4  . phentermine 30 MG capsule    Sig: Take 1 capsule (30 mg total) by mouth every morning.    Dispense:  30 capsule    Refill:  3     Follow-up: Return in about 4 months (around 12/28/2014).  Sanda Linger, MD

## 2014-08-29 NOTE — Assessment & Plan Note (Signed)
Exam done Vaccines were reviewed PAP is UTD Labs reviewed

## 2014-09-06 ENCOUNTER — Telehealth: Payer: Self-pay

## 2014-09-06 MED ORDER — TRIAMCINOLONE ACETONIDE 0.1 % EX CREA: 1.0000 "application " | TOPICAL_CREAM | Freq: Two times a day (BID) | CUTANEOUS | Status: DC

## 2014-09-06 NOTE — Telephone Encounter (Signed)
Patient c/o facial and neck rash which is inflamed, and would like MD advisement. Per MD ok to send in triamcinolone 1% cream to apply to area TID.

## 2014-09-06 NOTE — Telephone Encounter (Signed)
Pt.notified

## 2014-10-17 ENCOUNTER — Encounter: Payer: Self-pay | Admitting: Internal Medicine

## 2014-10-17 MED ORDER — SUMATRIPTAN-NAPROXEN SODIUM 85-500 MG PO TABS: 1.0000 | ORAL_TABLET | ORAL | Status: DC | PRN

## 2014-10-28 ENCOUNTER — Ambulatory Visit: Payer: 59 | Admitting: Internal Medicine

## 2014-10-28 ENCOUNTER — Ambulatory Visit (INDEPENDENT_AMBULATORY_CARE_PROVIDER_SITE_OTHER)
Admission: RE | Admit: 2014-10-28 | Discharge: 2014-10-28 | Disposition: A | Discharge status: Home / Self Care | Payer: 59 | Source: Ambulatory Visit | Attending: Internal Medicine | Admitting: Internal Medicine

## 2014-10-28 ENCOUNTER — Other Ambulatory Visit: Payer: Self-pay | Admitting: Internal Medicine

## 2014-10-28 DIAGNOSIS — T798XXA Other early complications of trauma, initial encounter: Secondary | ICD-10-CM

## 2014-10-28 MED ORDER — AMOXICILLIN-POT CLAVULANATE 875-125 MG PO TABS: 1.0000 | ORAL_TABLET | Freq: Two times a day (BID) | ORAL | Status: DC

## 2014-10-29 ENCOUNTER — Encounter: Payer: Self-pay | Admitting: Internal Medicine

## 2014-11-22 ENCOUNTER — Encounter: Payer: Self-pay | Admitting: Internal Medicine

## 2014-11-22 ENCOUNTER — Ambulatory Visit (INDEPENDENT_AMBULATORY_CARE_PROVIDER_SITE_OTHER): Payer: 59 | Admitting: Internal Medicine

## 2014-11-22 VITALS — BP 118/72 | HR 99 | Temp 97.8°F | Resp 16 | Wt 183.0 lb

## 2014-11-22 DIAGNOSIS — F329 Major depressive disorder, single episode, unspecified: Secondary | ICD-10-CM | POA: Diagnosis not present

## 2014-11-22 DIAGNOSIS — F450 Somatization disorder: Secondary | ICD-10-CM | POA: Diagnosis not present

## 2014-11-22 DIAGNOSIS — F32A Depression, unspecified: Secondary | ICD-10-CM | POA: Insufficient documentation

## 2014-11-22 MED ORDER — VORTIOXETINE HBR 10 MG PO TABS: 1.0000 | ORAL_TABLET | Freq: Every day | ORAL | Status: DC

## 2014-11-22 NOTE — Progress Notes (Signed)
Pre visit review using our clinic review tool, if applicable. No additional management support is needed unless otherwise documented below in the visit note. 

## 2014-11-22 NOTE — Patient Instructions (Signed)

## 2014-11-25 ENCOUNTER — Encounter: Payer: Self-pay | Admitting: Internal Medicine

## 2014-11-25 NOTE — Progress Notes (Signed)
Subjective:  Patient ID: Christina [name], female    DOB: 05-24-1980  Age: 34 y.o. MRN: [id number]  CC: Depression   HPI ATIANNA HAIDAR presents for complaints of a 2 month history of crying spells, anhedonia, sleep disturbance, loss of appetite, feeling helpless but not feeling hopeless, suicidal or homicidal. She was previously treated with Zoloft over a decade ago and did not respond well to it.  Outpatient Prescriptions Prior to Visit  Medication Sig Dispense Refill  . etonogestrel (IMPLANON) 68 MG IMPL implant 1 each by Subdermal route once.    . Multiple Vitamin (MULTIVITAMIN) tablet Take 1 tablet by mouth daily.    . SUMAtriptan-naproxen (TREXIMET) 85-500 MG per tablet Take 1 tablet by mouth every 2 (two) hours as needed for migraine. 49 tablet 0  . phentermine 30 MG capsule Take 1 capsule (30 mg total) by mouth every morning. 30 capsule 3  . SUMAtriptan Succinate 4 MG/0.5ML SOAJ Use once daily prn migraines. 2 cartridge 4  . amoxicillin-clavulanate (AUGMENTIN) 875-125 MG per tablet Take 1 tablet by mouth 2 (two) times daily. (Patient not taking: Reported on 11/22/2014) 20 tablet 0  . fluconazole (DIFLUCAN) 150 MG tablet Take 1 tablet (150 mg total) by mouth once. May repeat in 3 days prn (Patient not taking: Reported on 08/28/2014) 2 tablet 0  . triamcinolone cream (KENALOG) 0.1 % Apply 1 application topically 2 (two) times daily. (Patient not taking: Reported on 11/22/2014) 80 g 0   No facility-administered medications prior to visit.    ROS Review of Systems  Constitutional: Negative.  Negative for fever, chills, diaphoresis, activity change, appetite change, fatigue and unexpected weight change.  HENT: Negative.   Eyes: Negative.   Respiratory: Negative.  Negative for cough, choking, chest tightness, shortness of breath and stridor.   Cardiovascular: Negative.  Negative for chest pain, palpitations and leg swelling.  Gastrointestinal: Negative.  Negative for  nausea, vomiting, abdominal pain, diarrhea, constipation and blood in stool.  Endocrine: Negative.   Genitourinary: Negative.   Musculoskeletal: Negative.   Skin: Negative.   Allergic/Immunologic: Negative.   Neurological: Negative.   Hematological: Negative.  Negative for adenopathy. Does not bruise/bleed easily.  Psychiatric/Behavioral: Positive for sleep disturbance and dysphoric mood. Negative for suicidal ideas, hallucinations, behavioral problems, confusion, self-injury, decreased concentration and agitation. The patient is not nervous/anxious and is not hyperactive.     Objective:  BP 118/72 mmHg  Pulse 99  Wt 183 lb (83.008 kg)  SpO2 97%  LMP 11/14/2014  Breastfeeding? No  BP Readings from Last 3 Encounters:  11/22/14 118/72  08/28/14 108/70  04/09/14 112/76    Wt Readings from Last 3 Encounters:  11/22/14 183 lb (83.008 kg)  08/28/14 190 lb (86.183 kg)  04/09/14 172 lb 12.8 oz (78.382 kg)    Physical Exam  Psychiatric: Her speech is normal and behavior is normal. Judgment and thought content normal. Her mood appears anxious. Her affect is not angry, not blunt, not labile and not inappropriate. Cognition and memory are normal. She exhibits a depressed mood.  She is tearful and slightly anxious    Lab Results  Component Value Date   WBC 8.1 01/03/2014   HGB 14.9 01/03/2014   HCT 43.2 01/03/2014   PLT 189 01/03/2014   GLUCOSE 82 08/29/2014   CHOL 157 01/02/2014   TRIG 50.0 01/02/2014   HDL 41.70 01/02/2014   LDLCALC 105* 01/02/2014   ALT 11 01/03/2014   AST 19 01/03/2014   NA 139 08/29/2014  K 3.7 08/29/2014   CL 107 08/29/2014   CREATININE 0.78 08/29/2014   BUN 10 08/29/2014   CO2 26 08/29/2014   TSH 0.89 01/01/2014   HGBA1C 5.5 08/29/2014    Dg Tibia/fibula Left  10/29/2014   CLINICAL DATA:  Injury.  Posttraumatic wound infection.  EXAM: LEFT TIBIA AND FIBULA - 2 VIEW  COMPARISON:  None.  FINDINGS: No acute soft tissue or bony abnormality  identified. No radiopaque foreign bodies.  IMPRESSION: No acute abnormality.   Electronically Signed   By: Maisie Fus  Register   On: 10/29/2014 08:11    Assessment & Plan:   Mesa was seen today for depression.  Diagnoses and all orders for this visit:  Depression with somatization- will discontinue phentermine, will start Trimtellix, she was given samples for 5 mg which she will take for a week, will then increase to 10 mg a day for 3-4 weeks and may consider going up to 20 mg a day.   -     Vortioxetine HBr (TRINTELLIX) 10 MG TABS; Take 1 tablet (10 mg total) by mouth daily.   I have discontinued Ms. Cairrikier's fluconazole, phentermine, SUMAtriptan Succinate, triamcinolone cream, and amoxicillin-clavulanate. I am also having her start on Vortioxetine HBr. Additionally, I am having her maintain her etonogestrel, multivitamin, and SUMAtriptan-naproxen.  Meds ordered this encounter  Medications  . Vortioxetine HBr (TRINTELLIX) 10 MG TABS    Sig: Take 1 tablet (10 mg total) by mouth daily.    Dispense:  30 tablet    Refill:  5     Follow-up: Return in about 4 weeks (around 12/20/2014).  Sanda Linger, MD

## 2014-12-19 ENCOUNTER — Encounter: Payer: Self-pay | Admitting: Internal Medicine

## 2014-12-19 ENCOUNTER — Ambulatory Visit (INDEPENDENT_AMBULATORY_CARE_PROVIDER_SITE_OTHER): Payer: 59 | Admitting: Internal Medicine

## 2014-12-19 VITALS — BP 110/72 | HR 70 | Temp 98.3°F | Resp 16 | Ht 65.0 in | Wt 187.0 lb

## 2014-12-19 DIAGNOSIS — F32A Depression, unspecified: Secondary | ICD-10-CM

## 2014-12-19 DIAGNOSIS — F450 Somatization disorder: Secondary | ICD-10-CM

## 2014-12-19 DIAGNOSIS — F329 Major depressive disorder, single episode, unspecified: Secondary | ICD-10-CM

## 2014-12-19 MED ORDER — VORTIOXETINE HBR 10 MG PO TABS: 1.0000 | ORAL_TABLET | Freq: Every day | ORAL | Status: DC

## 2014-12-19 NOTE — Progress Notes (Signed)
Subjective:  Patient ID: Christina [name], female    DOB: 1980-06-07  Age: 34 y.o. MRN: [id number]  CC: Depression   HPI Christina [name] presents for follow-up on depression. She has been on Trintellix for several weeks and tells me that she is feeling quite a bit better with decreased anger, decreased irritability and decreased anxiety. She is tolerating it well with no complications.  Outpatient Prescriptions Prior to Visit  Medication Sig Dispense Refill  . etonogestrel (IMPLANON) 68 MG IMPL implant 1 each by Subdermal route once.    . Multiple Vitamin (MULTIVITAMIN) tablet Take 1 tablet by mouth daily.    . Vortioxetine HBr (TRINTELLIX) 10 MG TABS Take 1 tablet (10 mg total) by mouth daily. 30 tablet 5  . SUMAtriptan-naproxen (TREXIMET) 85-500 MG per tablet Take 1 tablet by mouth every 2 (two) hours as needed for migraine. (Patient not taking: Reported on 12/19/2014) 49 tablet 0   No facility-administered medications prior to visit.    ROS Review of Systems  Constitutional: Negative.  Negative for fever, chills, diaphoresis, appetite change and fatigue.  HENT: Negative.   Eyes: Negative.   Respiratory: Negative.   Cardiovascular: Negative.   Gastrointestinal: Negative.  Negative for abdominal pain.  Endocrine: Negative.   Genitourinary: Negative.   Musculoskeletal: Negative.   Allergic/Immunologic: Negative.   Neurological: Negative.   Hematological: Negative.  Negative for adenopathy. Does not bruise/bleed easily.  Psychiatric/Behavioral: Positive for dysphoric mood. Negative for confusion, sleep disturbance and agitation. The patient is not nervous/anxious.     Objective:  BP 110/72 mmHg  Pulse 70  Temp(Src) 98.3 F (36.8 C) (Oral)  Resp 16  Ht  (1.651 m)  Wt 187 lb (84.823 kg)  BMI 31.12 kg/m2  SpO2 97%  LMP 11/14/2014  BP Readings from Last 3 Encounters:  12/19/14 110/72  11/22/14 118/72  08/28/14 108/70    Wt Readings from Last 3  Encounters:  12/19/14 187 lb (84.823 kg)  11/22/14 183 lb (83.008 kg)  08/28/14 190 lb (86.183 kg)    Physical Exam  Constitutional: No distress.  HENT:  Mouth/Throat: No oropharyngeal exudate.  Eyes: Conjunctivae are normal. Right eye exhibits no discharge. Left eye exhibits no discharge. No scleral icterus.  Neck: Normal range of motion. Neck supple. No JVD present. No tracheal deviation present. No thyromegaly present.  Cardiovascular: Normal rate, regular rhythm, normal heart sounds and intact distal pulses.  Exam reveals no gallop and no friction rub.   No murmur heard. Pulmonary/Chest: Effort normal and breath sounds normal. No stridor. No respiratory distress. She has no wheezes. She has no rales. She exhibits no tenderness.  Abdominal: Soft. Bowel sounds are normal. She exhibits no distension and no mass. There is no tenderness. There is no rebound and no guarding.  Lymphadenopathy:    She has no cervical adenopathy.  Skin: She is not diaphoretic.  Psychiatric: She has a normal mood and affect. Her speech is normal and behavior is normal. Judgment and thought content normal. Her mood appears not anxious. Her affect is not angry, not blunt, not labile and not inappropriate. She is not agitated, not slowed and not withdrawn. Cognition and memory are normal. She does not exhibit a depressed mood. She expresses no homicidal and no suicidal ideation.    Lab Results  Component Value Date   WBC 8.1 01/03/2014   HGB 14.9 01/03/2014   HCT 43.2 01/03/2014   PLT 189 01/03/2014   GLUCOSE 82 08/29/2014   CHOL 157 01/02/2014  TRIG 50.0 01/02/2014   HDL 41.70 01/02/2014   LDLCALC 105* 01/02/2014   ALT 11 01/03/2014   AST 19 01/03/2014   NA 139 08/29/2014   K 3.7 08/29/2014   CL 107 08/29/2014   CREATININE 0.78 08/29/2014   BUN 10 08/29/2014   CO2 26 08/29/2014   TSH 0.89 01/01/2014   HGBA1C 5.5 08/29/2014    Dg Tibia/fibula Left  10/29/2014   CLINICAL DATA:  Injury.   Posttraumatic wound infection.  EXAM: LEFT TIBIA AND FIBULA - 2 VIEW  COMPARISON:  None.  FINDINGS: No acute soft tissue or bony abnormality identified. No radiopaque foreign bodies.  IMPRESSION: No acute abnormality.   Electronically Signed   By: Maisie Fus  Register   On: 10/29/2014 08:11    Assessment & Plan:   Shraddha was seen today for depression.  Diagnoses and all orders for this visit:  Depression with somatization- she is doing well on trintellix, will cont at the 10 mg dose -     Vortioxetine HBr (TRINTELLIX) 10 MG TABS; Take 1 tablet (10 mg total) by mouth daily.  I am having Ms. Cairrikier maintain her etonogestrel, multivitamin, SUMAtriptan-naproxen, and Vortioxetine HBr.  Meds ordered this encounter  Medications  . Vortioxetine HBr (TRINTELLIX) 10 MG TABS    Sig: Take 1 tablet (10 mg total) by mouth daily.    Dispense:  30 tablet    Refill:  5     Follow-up: Return in about 3 months (around 03/20/2015).  Sanda Linger, MD

## 2014-12-19 NOTE — Progress Notes (Signed)
Pre visit review using our clinic review tool, if applicable. No additional management support is needed unless otherwise documented below in the visit note. 

## 2014-12-19 NOTE — Patient Instructions (Signed)

## 2014-12-31 ENCOUNTER — Other Ambulatory Visit: Payer: Self-pay | Admitting: Family

## 2014-12-31 MED ORDER — AMOXICILLIN-POT CLAVULANATE 875-125 MG PO TABS: 1.0000 | ORAL_TABLET | Freq: Two times a day (BID) | ORAL | Status: DC

## 2015-01-16 ENCOUNTER — Encounter: Payer: Self-pay | Admitting: Internal Medicine

## 2015-01-16 ENCOUNTER — Ambulatory Visit (INDEPENDENT_AMBULATORY_CARE_PROVIDER_SITE_OTHER): Payer: 59 | Admitting: Internal Medicine

## 2015-01-16 ENCOUNTER — Ambulatory Visit: Payer: 59 | Admitting: Internal Medicine

## 2015-01-16 VITALS — BP 108/78 | HR 69 | Temp 99.0°F | Resp 14 | Ht 65.0 in | Wt 188.0 lb

## 2015-01-16 DIAGNOSIS — H8113 Benign paroxysmal vertigo, bilateral: Secondary | ICD-10-CM

## 2015-01-16 NOTE — Progress Notes (Signed)
Subjective:  Patient ID: Christina [name], female    DOB: 1980/09/24  Age: 34 y.o. MRN: [id number]  CC: Dizziness   HPI Christina [name] presents for a 2 day hx of intermittent vertigo but no other symptoms.  Outpatient Prescriptions Prior to Visit  Medication Sig Dispense Refill  . etonogestrel (IMPLANON) 68 MG IMPL implant 1 each by Subdermal route once.    . Multiple Vitamin (MULTIVITAMIN) tablet Take 1 tablet by mouth daily.    . Vortioxetine HBr (TRINTELLIX) 10 MG TABS Take 1 tablet (10 mg total) by mouth daily. 30 tablet 5  . amoxicillin-clavulanate (AUGMENTIN) 875-125 MG tablet Take 1 tablet by mouth 2 (two) times daily. 20 tablet 0  . SUMAtriptan-naproxen (TREXIMET) 85-500 MG per tablet Take 1 tablet by mouth every 2 (two) hours as needed for migraine. (Patient not taking: Reported on 01/16/2015) 49 tablet 0   No facility-administered medications prior to visit.    ROS Review of Systems  Constitutional: Negative.  Negative for fever, chills, diaphoresis, appetite change and fatigue.  HENT: Negative.  Negative for congestion, dental problem, ear pain, facial swelling, postnasal drip, rhinorrhea, sinus pressure and trouble swallowing.   Eyes: Negative.  Negative for photophobia and visual disturbance.  Respiratory: Negative.  Negative for cough, choking, chest tightness, shortness of breath and stridor.   Cardiovascular: Negative.  Negative for chest pain, palpitations and leg swelling.  Gastrointestinal: Negative.  Negative for nausea, vomiting, abdominal pain, diarrhea, constipation and blood in stool.  Endocrine: Negative.   Genitourinary: Negative.   Musculoskeletal: Negative.  Negative for myalgias, back pain, joint swelling and arthralgias.  Skin: Negative.  Negative for rash.  Allergic/Immunologic: Negative.   Neurological: Positive for dizziness. Negative for tremors, syncope, facial asymmetry, weakness, light-headedness and numbness.  Hematological:  Negative.  Negative for adenopathy. Does not bruise/bleed easily.  Psychiatric/Behavioral: Negative.     Objective:  BP 108/78 mmHg  Pulse 69  Temp(Src) 99 F (37.2 C) (Oral)  Resp 14  Ht  (1.651 m)  Wt 188 lb (85.276 kg)  BMI 31.28 kg/m2  SpO2 95%  LMP 12/16/2014  BP Readings from Last 3 Encounters:  01/16/15 108/78  12/19/14 110/72  11/22/14 118/72    Wt Readings from Last 3 Encounters:  01/16/15 188 lb (85.276 kg)  12/19/14 187 lb (84.823 kg)  11/22/14 183 lb (83.008 kg)    Physical Exam  Constitutional: She is oriented to person, place, and time. No distress.  HENT:  Mouth/Throat: Oropharynx is clear and moist. No oropharyngeal exudate.  Eyes: Conjunctivae and EOM are normal. Pupils are equal, round, and reactive to light. Right eye exhibits no discharge. Left eye exhibits no discharge. No scleral icterus.  Neck: Normal range of motion. Neck supple. No JVD present. No tracheal deviation present. No thyromegaly present.  Cardiovascular: Normal rate, regular rhythm, normal heart sounds and intact distal pulses.  Exam reveals no gallop and no friction rub.   No murmur heard. Pulmonary/Chest: Effort normal and breath sounds normal. No stridor. No respiratory distress. She has no wheezes. She has no rales. She exhibits no tenderness.  Abdominal: Soft. Bowel sounds are normal. She exhibits no distension and no mass. There is no tenderness. There is no rebound and no guarding.  Musculoskeletal: Normal range of motion. She exhibits no edema or tenderness.  Lymphadenopathy:    She has no cervical adenopathy.  Neurological: She is alert and oriented to person, place, and time. She has normal reflexes. She displays normal reflexes. No cranial  nerve deficit. She exhibits normal muscle tone. Coordination normal.  Skin: Skin is warm and dry. No rash noted. She is not diaphoretic. No erythema. No pallor.  Psychiatric: She has a normal mood and affect. Her behavior is normal.  Judgment and thought content normal.  Vitals reviewed.   Lab Results  Component Value Date   WBC 8.1 01/03/2014   HGB 14.9 01/03/2014   HCT 43.2 01/03/2014   PLT 189 01/03/2014   GLUCOSE 82 08/29/2014   CHOL 157 01/02/2014   TRIG 50.0 01/02/2014   HDL 41.70 01/02/2014   LDLCALC 105* 01/02/2014   ALT 11 01/03/2014   AST 19 01/03/2014   NA 139 08/29/2014   K 3.7 08/29/2014   CL 107 08/29/2014   CREATININE 0.78 08/29/2014   BUN 10 08/29/2014   CO2 26 08/29/2014   TSH 0.89 01/01/2014   HGBA1C 5.5 08/29/2014    Dg Tibia/fibula Left  10/29/2014  CLINICAL DATA:  Injury.  Posttraumatic wound infection. EXAM: LEFT TIBIA AND FIBULA - 2 VIEW COMPARISON:  None. FINDINGS: No acute soft tissue or bony abnormality identified. No radiopaque foreign bodies. IMPRESSION: No acute abnormality. Electronically Signed   By: Maisie Fushomas  Register   On: 10/29/2014 08:11    Assessment & Plan:   [name] was seen today for dizziness.  Diagnoses and all orders for this visit:  Benign paroxysmal positional vertigo, bilateral- her hx and exam are consistent with a benign cause, maybe viral or related to the new antidepressant or her depression, she does not want anything to treat the symptoms, she was given pt ed material  I have discontinued Ms. Cairrikier's amoxicillin-clavulanate. I am also having her maintain her etonogestrel, multivitamin, SUMAtriptan-naproxen, and Vortioxetine HBr.  No orders of the defined types were placed in this encounter.     Follow-up: Return if symptoms worsen or fail to improve.  Sanda Lingerhomas Jerrel Tiberio, MD

## 2015-01-16 NOTE — Progress Notes (Signed)
Pre visit review using our clinic review tool, if applicable. No additional management support is needed unless otherwise documented below in the visit note. 

## 2015-01-16 NOTE — Patient Instructions (Signed)

## 2015-01-18 DIAGNOSIS — H8110 Benign paroxysmal vertigo, unspecified ear: Secondary | ICD-10-CM | POA: Insufficient documentation

## 2015-01-21 ENCOUNTER — Encounter: Payer: Self-pay | Admitting: Internal Medicine

## 2015-02-21 ENCOUNTER — Ambulatory Visit: Payer: 59 | Admitting: Internal Medicine

## 2015-02-26 ENCOUNTER — Other Ambulatory Visit: Payer: Self-pay | Admitting: Internal Medicine

## 2015-02-26 ENCOUNTER — Ambulatory Visit (INDEPENDENT_AMBULATORY_CARE_PROVIDER_SITE_OTHER): Payer: 59 | Admitting: Internal Medicine

## 2015-02-26 ENCOUNTER — Encounter: Payer: Self-pay | Admitting: Internal Medicine

## 2015-02-26 VITALS — BP 126/78 | HR 88 | Temp 98.1°F | Resp 16 | Ht 65.0 in | Wt 192.2 lb

## 2015-02-26 DIAGNOSIS — F450 Somatization disorder: Secondary | ICD-10-CM | POA: Diagnosis not present

## 2015-02-26 DIAGNOSIS — F329 Major depressive disorder, single episode, unspecified: Secondary | ICD-10-CM | POA: Diagnosis not present

## 2015-02-26 DIAGNOSIS — E669 Obesity, unspecified: Secondary | ICD-10-CM | POA: Diagnosis not present

## 2015-02-26 DIAGNOSIS — F32A Depression, unspecified: Secondary | ICD-10-CM

## 2015-02-26 NOTE — Patient Instructions (Signed)

## 2015-02-26 NOTE — Progress Notes (Signed)
Pre visit review using our clinic review tool, if applicable. No additional management support is needed unless otherwise documented below in the visit note. 

## 2015-03-02 ENCOUNTER — Encounter: Payer: Self-pay | Admitting: Internal Medicine

## 2015-03-02 NOTE — Assessment & Plan Note (Signed)
In addition to lifestyle modifications with diet and exercise she will restart phentermine.

## 2015-03-02 NOTE — Progress Notes (Signed)
Subjective:  Patient ID: Christina [name], female    DOB: [birth year]  Age: 34 y.o. MRN: [id number]  CC: Depression   HPI Christina [name] presents for follow-up on depression. She continues to struggle with multiple stressors in her personal life, family life and at work. Overall she feels like she is improved. She is making better decisions and is taking better care of herself. She feels more optimistic. Her main complaint today is weight gain and she wants to restart phentermine to help her lose weight.  Outpatient Prescriptions Prior to Visit  Medication Sig Dispense Refill  . etonogestrel (IMPLANON) 68 MG IMPL implant 1 each by Subdermal route once.    . Multiple Vitamin (MULTIVITAMIN) tablet Take 1 tablet by mouth daily.    . Vortioxetine HBr (TRINTELLIX) 10 MG TABS Take 1 tablet (10 mg total) by mouth daily. 30 tablet 5  . phentermine 30 MG capsule TAKE 1 CAPSULE BY MOUTH EVERY MORNING 30 capsule 2  . SUMAtriptan-naproxen (TREXIMET) 85-500 MG per tablet Take 1 tablet by mouth every 2 (two) hours as needed for migraine. (Patient not taking: Reported on 02/26/2015) 49 tablet 0   No facility-administered medications prior to visit.    ROS Review of Systems  Constitutional: Positive for unexpected weight change. Negative for fever, chills, diaphoresis, appetite change and fatigue.  HENT: Negative.   Eyes: Negative.   Respiratory: Negative.  Negative for cough, choking, chest tightness, shortness of breath and stridor.   Cardiovascular: Negative.  Negative for chest pain, palpitations and leg swelling.  Gastrointestinal: Negative.  Negative for abdominal pain.  Endocrine: Negative.   Genitourinary: Negative.   Musculoskeletal: Negative.  Negative for myalgias and back pain.  Skin: Negative.  Negative for color change and rash.  Allergic/Immunologic: Negative.   Neurological: Negative.  Negative for dizziness.  Hematological: Negative.  Negative for adenopathy. Does  not bruise/bleed easily.  Psychiatric/Behavioral: Positive for dysphoric mood. Negative for suicidal ideas, hallucinations, behavioral problems, confusion, sleep disturbance, self-injury, decreased concentration and agitation. The patient is not nervous/anxious and is not hyperactive.        She has mild anhedonia, irritability, and increased appetite.    Objective:  BP 126/78 mmHg  Pulse 88  Temp(Src) 98.1 F (36.7 C) (Oral)  Resp 16  Ht 5\' 5"  (1.651 m)  Wt 192 lb 4 oz (87.204 kg)  BMI 31.99 kg/m2  SpO2 97%  LMP 02/18/2015  BP Readings from Last 3 Encounters:  02/26/15 126/78  01/16/15 108/78  12/19/14 110/72    Wt Readings from Last 3 Encounters:  02/26/15 192 lb 4 oz (87.204 kg)  01/16/15 188 lb (85.276 kg)  12/19/14 187 lb (84.823 kg)    Physical Exam  Constitutional: She is oriented to person, place, and time. No distress.  HENT:  Mouth/Throat: Oropharynx is clear and moist. No oropharyngeal exudate.  Eyes: Conjunctivae are normal. Right eye exhibits no discharge. Left eye exhibits no discharge. No scleral icterus.  Neck: Normal range of motion. Neck supple. No JVD present. No tracheal deviation present. No thyromegaly present.  Cardiovascular: Normal rate, regular rhythm, normal heart sounds and intact distal pulses.  Exam reveals no gallop and no friction rub.   No murmur heard. Pulmonary/Chest: Effort normal and breath sounds normal. No stridor. No respiratory distress. She has no wheezes. She has no rales. She exhibits no tenderness.  Abdominal: Soft. Bowel sounds are normal. She exhibits no distension and no mass. There is no tenderness. There is no rebound and no guarding.  Musculoskeletal: Normal range of motion. She exhibits no edema or tenderness.  Lymphadenopathy:    She has no cervical adenopathy.  Neurological: She is oriented to person, place, and time.  Skin: Skin is warm and dry. No rash noted. She is not diaphoretic. No erythema. No pallor.    Lab  Results  Component Value Date   WBC 8.1 01/03/2014   HGB 14.9 01/03/2014   HCT 43.2 01/03/2014   PLT 189 01/03/2014   GLUCOSE 82 08/29/2014   CHOL 157 01/02/2014   TRIG 50.0 01/02/2014   HDL 41.70 01/02/2014   LDLCALC 105* 01/02/2014   ALT 11 01/03/2014   AST 19 01/03/2014   NA 139 08/29/2014   K 3.7 08/29/2014   CL 107 08/29/2014   CREATININE 0.78 08/29/2014   BUN 10 08/29/2014   CO2 26 08/29/2014   TSH 0.89 01/01/2014   HGBA1C 5.5 08/29/2014    Dg Tibia/fibula Left  10/29/2014  CLINICAL DATA:  Injury.  Posttraumatic wound infection. EXAM: LEFT TIBIA AND FIBULA - 2 VIEW COMPARISON:  None. FINDINGS: No acute soft tissue or bony abnormality identified. No radiopaque foreign bodies. IMPRESSION: No acute abnormality. Electronically Signed   By: Maisie Fus  Register   On: 10/29/2014 08:11    Assessment & Plan:   Jenise was seen today for depression.  Diagnoses and all orders for this visit:  Depression with somatization she has improved with Trintellix, she is willing to start psychotherapy  -     Ambulatory referral to Psychology  Obesity (BMI 30.0-34.9)- restart phentermine   I have discontinued Ms. [name]'s SUMAtriptan-naproxen and phentermine. I am also having her maintain her etonogestrel, multivitamin, and Vortioxetine HBr.  No orders of the defined types were placed in this encounter.     Follow-up: Return if symptoms worsen or fail to improve.  Sanda Linger, MD

## 2015-03-05 ENCOUNTER — Other Ambulatory Visit: Payer: 59

## 2015-03-05 ENCOUNTER — Other Ambulatory Visit (INDEPENDENT_AMBULATORY_CARE_PROVIDER_SITE_OTHER): Payer: 59 | Admitting: Internal Medicine

## 2015-03-05 ENCOUNTER — Telehealth: Payer: Self-pay

## 2015-03-05 ENCOUNTER — Other Ambulatory Visit: Payer: Self-pay

## 2015-03-05 DIAGNOSIS — R300 Dysuria: Secondary | ICD-10-CM

## 2015-03-05 LAB — POCT URINALYSIS DIPSTICK
BILIRUBIN UA: NEGATIVE
Glucose, UA: NEGATIVE
Ketones, UA: NEGATIVE
Leukocytes, UA: NEGATIVE
Nitrite, UA: POSITIVE
Protein, UA: NEGATIVE
RBC UA: 10
Spec Grav, UA: <=1.005
Urobilinogen, UA: 0.2
pH, UA: 6

## 2015-03-05 NOTE — Telephone Encounter (Signed)
Pt c/u dysuria and foul smelling urine. Wants Ua done. Order has been put in.

## 2015-03-07 ENCOUNTER — Other Ambulatory Visit: Payer: Self-pay | Admitting: Internal Medicine

## 2015-03-07 LAB — URINE CULTURE

## 2015-03-07 MED ORDER — FLUCONAZOLE 150 MG PO TABS: 150.0000 mg | ORAL_TABLET | Freq: Once | ORAL | Status: DC

## 2015-03-07 MED ORDER — SULFAMETHOXAZOLE-TRIMETHOPRIM 800-160 MG PO TABS: 1.0000 | ORAL_TABLET | Freq: Two times a day (BID) | ORAL | Status: AC

## 2015-04-02 DIAGNOSIS — Z1389 Encounter for screening for other disorder: Secondary | ICD-10-CM | POA: Diagnosis not present

## 2015-04-02 DIAGNOSIS — Z3046 Encounter for surveillance of implantable subdermal contraceptive: Secondary | ICD-10-CM | POA: Diagnosis not present

## 2015-04-02 DIAGNOSIS — Z01419 Encounter for gynecological examination (general) (routine) without abnormal findings: Secondary | ICD-10-CM | POA: Diagnosis not present

## 2015-04-02 DIAGNOSIS — Z130 Encounter for screening for diseases of the blood and blood-forming organs and certain disorders involving the immune mechanism: Secondary | ICD-10-CM | POA: Diagnosis not present

## 2015-04-02 DIAGNOSIS — Z309 Encounter for contraceptive management, unspecified: Secondary | ICD-10-CM | POA: Diagnosis not present

## 2015-04-02 DIAGNOSIS — Z6832 Body mass index (BMI) 32.0-32.9, adult: Secondary | ICD-10-CM | POA: Diagnosis not present

## 2015-04-02 DIAGNOSIS — Z9889 Other specified postprocedural states: Secondary | ICD-10-CM | POA: Diagnosis not present

## 2015-04-02 DIAGNOSIS — Z124 Encounter for screening for malignant neoplasm of cervix: Secondary | ICD-10-CM | POA: Diagnosis not present

## 2015-04-02 DIAGNOSIS — Z1151 Encounter for screening for human papillomavirus (HPV): Secondary | ICD-10-CM | POA: Diagnosis not present

## 2015-04-03 DIAGNOSIS — Z124 Encounter for screening for malignant neoplasm of cervix: Secondary | ICD-10-CM | POA: Diagnosis not present

## 2015-04-05 LAB — HM PAP SMEAR

## 2015-04-07 ENCOUNTER — Ambulatory Visit (INDEPENDENT_AMBULATORY_CARE_PROVIDER_SITE_OTHER): Payer: 59 | Admitting: Psychology

## 2015-04-07 DIAGNOSIS — F331 Major depressive disorder, recurrent, moderate: Secondary | ICD-10-CM

## 2015-04-17 ENCOUNTER — Ambulatory Visit (INDEPENDENT_AMBULATORY_CARE_PROVIDER_SITE_OTHER): Payer: 59 | Admitting: Psychology

## 2015-04-17 DIAGNOSIS — F331 Major depressive disorder, recurrent, moderate: Secondary | ICD-10-CM | POA: Diagnosis not present

## 2015-04-23 ENCOUNTER — Ambulatory Visit: Payer: Self-pay | Admitting: Internal Medicine

## 2015-04-24 ENCOUNTER — Ambulatory Visit (INDEPENDENT_AMBULATORY_CARE_PROVIDER_SITE_OTHER): Payer: 59 | Admitting: Internal Medicine

## 2015-04-24 ENCOUNTER — Ambulatory Visit (INDEPENDENT_AMBULATORY_CARE_PROVIDER_SITE_OTHER): Payer: 59 | Admitting: Psychology

## 2015-04-24 ENCOUNTER — Encounter: Payer: Self-pay | Admitting: Internal Medicine

## 2015-04-24 VITALS — BP 118/80 | HR 75 | Temp 98.0°F | Resp 16 | Ht 65.0 in | Wt 189.0 lb

## 2015-04-24 DIAGNOSIS — F4323 Adjustment disorder with mixed anxiety and depressed mood: Secondary | ICD-10-CM | POA: Diagnosis not present

## 2015-04-24 DIAGNOSIS — F450 Somatization disorder: Secondary | ICD-10-CM | POA: Diagnosis not present

## 2015-04-24 DIAGNOSIS — F329 Major depressive disorder, single episode, unspecified: Secondary | ICD-10-CM | POA: Diagnosis not present

## 2015-04-24 MED ORDER — VORTIOXETINE HBR 20 MG PO TABS: 20.0000 mg | ORAL_TABLET | Freq: Every day | ORAL | Status: DC

## 2015-04-24 NOTE — Progress Notes (Signed)
Pre visit review using our clinic review tool, if applicable. No additional management support is needed unless otherwise documented below in the visit note. 

## 2015-04-24 NOTE — Patient Instructions (Signed)
Major Depressive Disorder Major depressive disorder is a mental illness. It also may be called clinical depression or unipolar depression. Major depressive disorder usually causes feelings of sadness, hopelessness, or helplessness. Some people with this disorder do not feel particularly sad but lose interest in doing things they used to enjoy (anhedonia). Major depressive disorder also can cause physical symptoms. It can interfere with work, school, relationships, and other normal everyday activities. The disorder varies in severity but is longer lasting and more serious than the sadness we all feel from time to time in our lives. Major depressive disorder often is triggered by stressful life events or major life changes. Examples of these triggers include divorce, loss of your job or home, a move, and the death of a family member or close friend. Sometimes this disorder occurs for no obvious reason at all. People who have family members with major depressive disorder or bipolar disorder are at higher risk for developing this disorder, with or without life stressors. Major depressive disorder can occur at any age. It may occur just once in your life (single episode major depressive disorder). It may occur multiple times (recurrent major depressive disorder). SYMPTOMS People with major depressive disorder have either anhedonia or depressed mood on nearly a daily basis for at least 2 weeks or longer. Symptoms of depressed mood include:  Feelings of sadness (blue or down in the dumps) or emptiness.  Feelings of hopelessness or helplessness.  Tearfulness or episodes of crying (may be observed by others).  Irritability (children and adolescents). In addition to depressed mood or anhedonia or both, people with this disorder have at least four of the following symptoms:  Difficulty sleeping or sleeping too much.   Significant change (increase or decrease) in appetite or weight.   Lack of energy or  motivation.  Feelings of guilt and worthlessness.   Difficulty concentrating, remembering, or making decisions.  Unusually slow movement (psychomotor retardation) or restlessness (as observed by others).   Recurrent wishes for death, recurrent thoughts of self-harm (suicide), or a suicide attempt. People with major depressive disorder commonly have persistent negative thoughts about themselves, other people, and the world. People with severe major depressive disorder may experiencedistorted beliefs or perceptions about the world (psychotic delusions). They also may see or hear things that are not real (psychotic hallucinations). DIAGNOSIS Major depressive disorder is diagnosed through an assessment by your health care provider. Your health care provider will ask aboutaspects of your daily life, such as mood,sleep, and appetite, to see if you have the diagnostic symptoms of major depressive disorder. Your health care provider may ask about your medical history and use of alcohol or drugs, including prescription medicines. Your health care provider also may do a physical exam and blood work. This is because certain medical conditions and the use of certain substances can cause major depressive disorder-like symptoms (secondary depression). Your health care provider also may refer you to a mental health specialist for further evaluation and treatment. TREATMENT It is important to recognize the symptoms of major depressive disorder and seek treatment. The following treatments can be prescribed for this disorder:   Medicine. Antidepressant medicines usually are prescribed. Antidepressant medicines are thought to correct chemical imbalances in the brain that are commonly associated with major depressive disorder. Other types of medicine may be added if the symptoms do not respond to antidepressant medicines alone or if psychotic delusions or hallucinations occur.  Talk therapy. Talk therapy can be  helpful in treating major depressive disorder by providing   support, education, and guidance. Certain types of talk therapy also can help with negative thinking (cognitive behavioral therapy) and with relationship issues that trigger this disorder (interpersonal therapy). A mental health specialist can help determine which treatment is best for you. Most people with major depressive disorder do well with a combination of medicine and talk therapy. Treatments involving electrical stimulation of the brain can be used in situations with extremely severe symptoms or when medicine and talk therapy do not work over time. These treatments include electroconvulsive therapy, transcranial magnetic stimulation, and vagal nerve stimulation.   This information is not intended to replace advice given to you by your health care provider. Make sure you discuss any questions you have with your health care provider.   Document Released: 07/03/2012 Document Revised: 03/29/2014 Document Reviewed: 07/03/2012 [employer] Interactive Patient Education 2016 [employer]  

## 2015-04-25 ENCOUNTER — Encounter: Payer: Self-pay | Admitting: Internal Medicine

## 2015-04-25 NOTE — Progress Notes (Signed)
Subjective:  Patient ID: Christina [name], female    DOB: 1981/02/22  Age: 35 y.o. MRN: [id number]  CC: Depression   HPI SAKARI RAISANEN presents for follow-up on depression, she wants to increase the dose of Trintellix. She is seeing psychology and is making improvement with psychotherapy. She complains of continued stressors in her home, her mother is an unrecovered alcoholic/addict and is not doing well. She feels overwhelmed with her sense of work responsibilities, family responsibilities with managing her husband and children as well as her ill mother.  Outpatient Prescriptions Prior to Visit  Medication Sig Dispense Refill  . Multiple Vitamin (MULTIVITAMIN) tablet Take 1 tablet by mouth daily.    Marland Kitchen etonogestrel (IMPLANON) 68 MG IMPL implant 1 each by Subdermal route once.    . Vortioxetine HBr (TRINTELLIX) 10 MG TABS Take 1 tablet (10 mg total) by mouth daily. 30 tablet 5  . fluconazole (DIFLUCAN) 150 MG tablet Take 1 tablet (150 mg total) by mouth once. (Patient not taking: Reported on 04/24/2015) 1 tablet 3   No facility-administered medications prior to visit.    ROS Review of Systems  Constitutional: Negative for activity change, appetite change and unexpected weight change.  Psychiatric/Behavioral: Positive for sleep disturbance and dysphoric mood. Negative for suicidal ideas, behavioral problems, self-injury and decreased concentration. The patient is nervous/anxious.   All other systems reviewed and are negative.   Objective:  BP 118/80 mmHg  Pulse 75  Temp(Src) 98 F (36.7 C) (Oral)  Ht  (1.651 m)  Wt 189 lb (85.73 kg)  BMI 31.45 kg/m2  SpO2 98%  BP Readings from Last 3 Encounters:  04/24/15 118/80  02/26/15 126/78  01/16/15 108/78    Wt Readings from Last 3 Encounters:  04/24/15 189 lb (85.73 kg)  02/26/15 192 lb 4 oz (87.204 kg)  01/16/15 188 lb (85.276 kg)    Physical Exam  Psychiatric: Her behavior is normal. Judgment and thought  content normal. Her mood appears not anxious. Her affect is angry. Her affect is not blunt, not labile and not inappropriate. Her speech is not rapid and/or pressured, not delayed, not tangential and not slurred. Cognition and memory are normal. She exhibits a depressed mood. She expresses no homicidal and no suicidal ideation. She expresses no suicidal plans and no homicidal plans. She is communicative.  She is crying intermittently during the exam today    Lab Results  Component Value Date   WBC 8.1 01/03/2014   HGB 14.9 01/03/2014   HCT 43.2 01/03/2014   PLT 189 01/03/2014   GLUCOSE 82 08/29/2014   CHOL 157 01/02/2014   TRIG 50.0 01/02/2014   HDL 41.70 01/02/2014   LDLCALC 105* 01/02/2014   ALT 11 01/03/2014   AST 19 01/03/2014   NA 139 08/29/2014   K 3.7 08/29/2014   CL 107 08/29/2014   CREATININE 0.78 08/29/2014   BUN 10 08/29/2014   CO2 26 08/29/2014   TSH 0.89 01/01/2014   HGBA1C 5.5 08/29/2014    Dg Tibia/fibula Left  10/29/2014  CLINICAL DATA:  Injury.  Posttraumatic wound infection. EXAM: LEFT TIBIA AND FIBULA - 2 VIEW COMPARISON:  None. FINDINGS: No acute soft tissue or bony abnormality identified. No radiopaque foreign bodies. IMPRESSION: No acute abnormality. Electronically Signed   By: Maisie Fus  Register   On: 10/29/2014 08:11    Assessment & Plan:   Shena was seen today for depression.  Diagnoses and all orders for this visit:  Depression with somatization- I will increase  the Trintellix dose, she will continue psychotherapy, she is planning to buy her own home and move away from her mother, she has good social support with a sister that she is close to. Let me know if she develops any new or worsening symptoms. -     Vortioxetine HBr (TRINTELLIX) 20 MG TABS; Take 20 mg by mouth daily.   I have discontinued Ms. Davidson's etonogestrel and Vortioxetine HBr. I am also having her start on Vortioxetine HBr. Additionally, I am having her maintain her multivitamin,  fluconazole, phentermine, etonogestrel-ethinyl estradiol, vitamin B-12, and Omega-3 Fatty Acids (OMEGA 3 PO).  Meds ordered this encounter  Medications  . phentermine 30 MG capsule    Sig: Reported on 04/24/2015    Refill:  2  . etonogestrel-ethinyl estradiol (NUVARING) 0.12-0.015 MG/24HR vaginal ring    Sig: Place 1 each vaginally every 28 (twenty-eight) days. Insert vaginally and leave in place for 3 consecutive weeks, then remove for 1 week.  . vitamin B-12 (CYANOCOBALAMIN) 1000 MCG tablet    Sig: Take 1,000 mcg by mouth daily.  . Omega-3 Fatty Acids (OMEGA 3 PO)    Sig: Take by mouth.  . Vortioxetine HBr (TRINTELLIX) 20 MG TABS    Sig: Take 20 mg by mouth daily.    Dispense:  30 tablet    Refill:  11     Follow-up: Return in about 3 months (around 07/22/2015).  Sanda Linger, MD

## 2015-05-09 ENCOUNTER — Encounter: Payer: Self-pay | Admitting: Family Medicine

## 2015-05-09 ENCOUNTER — Ambulatory Visit (INDEPENDENT_AMBULATORY_CARE_PROVIDER_SITE_OTHER): Payer: 59 | Admitting: Family Medicine

## 2015-05-09 DIAGNOSIS — M549 Dorsalgia, unspecified: Secondary | ICD-10-CM | POA: Diagnosis not present

## 2015-05-09 DIAGNOSIS — M9903 Segmental and somatic dysfunction of lumbar region: Secondary | ICD-10-CM | POA: Diagnosis not present

## 2015-05-09 DIAGNOSIS — M9902 Segmental and somatic dysfunction of thoracic region: Secondary | ICD-10-CM

## 2015-05-09 DIAGNOSIS — S29019A Strain of muscle and tendon of unspecified wall of thorax, initial encounter: Secondary | ICD-10-CM | POA: Diagnosis not present

## 2015-05-09 DIAGNOSIS — M9901 Segmental and somatic dysfunction of cervical region: Secondary | ICD-10-CM

## 2015-05-09 DIAGNOSIS — M999 Biomechanical lesion, unspecified: Secondary | ICD-10-CM | POA: Insufficient documentation

## 2015-05-09 DIAGNOSIS — S29012A Strain of muscle and tendon of back wall of thorax, initial encounter: Secondary | ICD-10-CM

## 2015-05-09 NOTE — Progress Notes (Signed)
[name] [address] [place], [place] [address] Phone: [phone] Subjective:    I'm seeing this patient by the request  of:    CC: Back pain  [id number] Christina [name] is a 35 y.o. female coming in with complaint of  midback pain. Patient states that it seems to be severe. Doesn't remember trying to pick up her child and is having difficult he secondary to the pain. If she moves her neck as certain way she has pain 2. Very localized in the mid back. Denies any numbness or any radiation. Patient rates the severity pain though is 8 out of 10. Can catch her breath sometimes. Starting affects some of her daily activities. Does not remember any true injury.    Past Medical History  Diagnosis Date  . Ovarian cyst   . Postpartum hemorrhage   . Normal pregnancy 03/11/2012  . SVD (spontaneous vaginal delivery) 03/11/2012  . Migraine    No past surgical history on file. Social History   Social History  . Marital Status: Divorced    Spouse Name: N/A  . Number of Children: N/A  . Years of Education: N/A   Occupational History  . cma  [employer]   Social History Main Topics  . Smoking status: Current Every Day Smoker -- 0.50 packs/day for 20 years    Types: Cigarettes  . Smokeless tobacco: None  . Alcohol Use: No  . Drug Use: No  . Sexual Activity:    Partners: Male   Other Topics Concern  . None   Social History Narrative   Allergies  Allergen Reactions  . Hydrocodone Itching  . Prednisone Other (See Comments)    Hyper, agitated, aggressive   Family History  Problem Relation Age of Onset  . Arthritis Father   . Sudden death Father 33    Sinus Cancer  . Cancer Father     nasal cancer  . Arthritis Maternal Grandfather   . Hyperlipidemia Maternal Grandfather   . Heart disease Maternal Grandfather   . Breast cancer Maternal Grandmother   . Arthritis Maternal Grandmother   . Lung cancer Maternal Grandmother   . Heart  disease Maternal Grandmother   . Hyperlipidemia Paternal Grandmother   . Stroke Paternal Grandfather   . Bipolar disorder Mother   . Sudden death Brother     GSW    Past medical history, social, surgical and family history all reviewed in electronic medical record.  No pertanent information unless stated regarding to the chief complaint.   Review of Systems: No headache, visual changes, nausea, vomiting, diarrhea, constipation, dizziness, abdominal pain, skin rash, fevers, chills, night sweats, weight loss, swollen lymph nodes, body aches, joint swelling, muscle aches, chest pain, shortness of breath, mood changes.   Objective There were no vitals taken for this visit.  General: No apparent distress alert and oriented x3 mood and affect normal, dressed appropriately.  HEENT: Pupils equal, extraocular movements intact  Respiratory: Patient's speak in full sentences and does not appear short of breath  Cardiovascular: No lower extremity edema, non tender, no erythema  Skin: Warm dry intact with no signs of infection or rash on extremities or on axial skeleton.  Abdomen: Soft nontender  Neuro: Cranial nerves II through XII are intact, neurovascularly intact in all extremities with 2+ DTRs and 2+ pulses.  Lymph: No lymphadenopathy of posterior or anterior cervical chain or axillae bilaterally.  Gait normal with good balance and coordination.  MSK:  Non  tender with full range of motion and good stability and symmetric strength and tone of shoulders, elbows, wrist, hip, knee and ankles bilaterally.  Neck: Inspection unremarkable. No palpable stepoffs. Negative Spurling's maneuver. Full neck range of motion Grip strength and sensation normal in bilateral hands Strength good C4 to T1 distribution No sensory change to C4 to T1 Negative Hoffman sign bilaterally Reflexes normal  Back Exam:  Inspection: Unremarkable  Motion: Flexion 45 deg, Extension 45 deg, Side Bending to 45 deg  bilaterally,  Rotation to 45 deg bilaterally  SLR laying: Negative  XSLR laying: Negative  Palpable tenderness: Tender to palpation in the paraspinal musculature of the thoracic spine mostly over T5 T7 bilaterally. No spinous process tenderness. Trigger points noted in the right lower trapezius muscle. FABER: negative. Sensory change: Gross sensation intact to all lumbar and sacral dermatomes.  Reflexes: 2+ at both patellar tendons, 2+ at achilles tendons, Babinski's downgoing.  Strength at foot  Plantar-flexion: 5/5 Dorsi-flexion: 5/5 Eversion: 5/5 Inversion: 5/5  Leg strength  Quad: 5/5 Hamstring: 5/5 Hip flexor: 5/5 Hip abductors: 4/5  Gait unremarkable.  Osteopathic findings C2 flexed rotated and side bent right T3 extended rotated and side bent left T7 extended rotated and side bent right L2 flexed rotated and side bent right   After verbal consent patient was prepped with alcohol swabs and with a 25-gauge 1 inch needle and injected a total of 4 trigger points in the right trapezius muscle. Patient tolerated the procedure well.   Impression and Recommendations:     This case required medical decision making of moderate complexity.      Note: This dictation was prepared with Dragon dictation along with smaller phrase technology. Any transcriptional errors that result from this process are unintentional.

## 2015-05-09 NOTE — Assessment & Plan Note (Signed)
I do believe the patient did have a strain and did have a muscle spasm. Responded very well to osteopathic manipulation as well as trigger point injections. We discussed icing regimen and home exercises. She'll come back and see me again in 3-4 weeks.

## 2015-05-09 NOTE — Progress Notes (Signed)
Pre visit review using our clinic review tool, if applicable. No additional management support is needed unless otherwise documented below in the visit note. 

## 2015-05-09 NOTE — Patient Instructions (Signed)
Verbal instructions given

## 2015-05-09 NOTE — Assessment & Plan Note (Signed)
Decision today to treat with OMT was based on Physical Exam  After verbal consent patient was treated with HVLA, ME, FPR techniques in thoracic, cervical, and lumbar areas  Patient tolerated the procedure well with improvement in symptoms  Patient given exercises, stretches and lifestyle modifications  See medications in patient instructions if given  Patient will follow up in 3 weeks

## 2015-05-15 ENCOUNTER — Ambulatory Visit: Payer: 59 | Admitting: Psychology

## 2015-05-15 DIAGNOSIS — Z302 Encounter for sterilization: Secondary | ICD-10-CM | POA: Diagnosis not present

## 2015-05-15 DIAGNOSIS — Z3202 Encounter for pregnancy test, result negative: Secondary | ICD-10-CM | POA: Diagnosis not present

## 2015-06-18 ENCOUNTER — Ambulatory Visit (INDEPENDENT_AMBULATORY_CARE_PROVIDER_SITE_OTHER): Payer: 59 | Admitting: Family Medicine

## 2015-06-18 ENCOUNTER — Other Ambulatory Visit (INDEPENDENT_AMBULATORY_CARE_PROVIDER_SITE_OTHER): Payer: 59

## 2015-06-18 DIAGNOSIS — S29012D Strain of muscle and tendon of back wall of thorax, subsequent encounter: Secondary | ICD-10-CM

## 2015-06-18 DIAGNOSIS — M25511 Pain in right shoulder: Secondary | ICD-10-CM

## 2015-06-18 DIAGNOSIS — S29019D Strain of muscle and tendon of unspecified wall of thorax, subsequent encounter: Secondary | ICD-10-CM | POA: Diagnosis not present

## 2015-06-18 DIAGNOSIS — M9901 Segmental and somatic dysfunction of cervical region: Secondary | ICD-10-CM

## 2015-06-18 DIAGNOSIS — M9903 Segmental and somatic dysfunction of lumbar region: Secondary | ICD-10-CM | POA: Diagnosis not present

## 2015-06-18 DIAGNOSIS — M9902 Segmental and somatic dysfunction of thoracic region: Secondary | ICD-10-CM

## 2015-06-18 DIAGNOSIS — M999 Biomechanical lesion, unspecified: Secondary | ICD-10-CM

## 2015-06-18 MED ORDER — DICLOFENAC SODIUM 2 % TD SOLN: 2.0000 "application " | Freq: Two times a day (BID) | TRANSDERMAL | Status: DC

## 2015-06-18 MED ORDER — CYCLOBENZAPRINE HCL 10 MG PO TABS: 10.0000 mg | ORAL_TABLET | Freq: Every day | ORAL | Status: DC

## 2015-06-18 NOTE — Assessment & Plan Note (Signed)
Decision today to treat with OMT was based on Physical Exam  After verbal consent patient was treated with HVLA, ME, FPR techniques in thoracic, cervical, and lumbar areas  Patient tolerated the procedure well with improvement in symptoms  Patient given exercises, stretches and lifestyle modifications  See medications in patient instructions if given  Patient will follow up in 3-4 weeks      

## 2015-06-18 NOTE — Patient Instructions (Signed)
Good to see you  Ice 20 minutes 2 times daily. Usually after activity and before bed. Meloxicam daily for 5 days Flexeril at night if needed pennsaid pinkie amount topically 2 times daily as needed.  See me again in 3 weeks if need manipulation.

## 2015-06-18 NOTE — Progress Notes (Signed)
[name] [address] [place], [place] [address] Phone: [phone] Subjective:    I'm seeing this patient by the request  of:    CC: Back pain, Right shoulder pain  Christina [name] is a 35 y.o. female coming in with complaint of  midback pain. Seems to be more right-sided. Patient recently has moved houses. Was doing ladder repetitive lifting. States that she woke up this morning having severe pain on the right side. There was possibly her shoulder. Patient states that it seems to be localized in the back. Denies any radiation down the arm or any numbness. Denies any neck pain that's associated with it. Has not responded to over-the-counter medications. Has not tried any other home modalities such as icing. Rates the severity of pain a 7 out of 10.    Past Medical History  Diagnosis Date  . Ovarian cyst   . Postpartum hemorrhage   . Normal pregnancy 03/11/2012  . SVD (spontaneous vaginal delivery) 03/11/2012  . Migraine    No past surgical history on file. Social History   Social History  . Marital Status: Divorced    Spouse Name: N/A  . Number of Children: N/A  . Years of Education: N/A   Occupational History  . cma  [employer]   Social History Main Topics  . Smoking status: Current Every Day Smoker -- 0.50 packs/day for 20 years    Types: Cigarettes  . Smokeless tobacco: Not on file  . Alcohol Use: No  . Drug Use: No  . Sexual Activity:    Partners: Male   Other Topics Concern  . Not on file   Social History Narrative   Allergies  Allergen Reactions  . Hydrocodone Itching  . Prednisone Other (See Comments)    Hyper, agitated, aggressive   Family History  Problem Relation Age of Onset  . Arthritis Father   . Sudden death Father 97    Sinus Cancer  . Cancer Father     nasal cancer  . Arthritis Maternal Grandfather   . Hyperlipidemia Maternal Grandfather   . Heart disease Maternal Grandfather   .  Breast cancer Maternal Grandmother   . Arthritis Maternal Grandmother   . Lung cancer Maternal Grandmother   . Heart disease Maternal Grandmother   . Hyperlipidemia Paternal Grandmother   . Stroke Paternal Grandfather   . Bipolar disorder Mother   . Sudden death Brother     GSW    Past medical history, social, surgical and family history all reviewed in electronic medical record.  No pertanent information unless stated regarding to the chief complaint.   Review of Systems: No headache, visual changes, nausea, vomiting, diarrhea, constipation, dizziness, abdominal pain, skin rash, fevers, chills, night sweats, weight loss, swollen lymph nodes, body aches, joint swelling, muscle aches, chest pain, shortness of breath, mood changes.   Objective There were no vitals taken for this visit.  General: No apparent distress alert and oriented x3 mood and affect normal, dressed appropriately.  HEENT: Pupils equal, extraocular movements intact  Respiratory: Patient's speak in full sentences and does not appear short of breath  Cardiovascular: No lower extremity edema, non tender, no erythema  Skin: Warm dry intact with no signs of infection or rash on extremities or on axial skeleton.  Abdomen: Soft nontender  Neuro: Cranial nerves II through XII are intact, neurovascularly intact in all extremities with 2+ DTRs and 2+ pulses.  Lymph: No lymphadenopathy of posterior or anterior  cervical chain or axillae bilaterally.  Gait normal with good balance and coordination.  MSK:  Non tender with full range of motion and good stability and symmetric strength and tone of  elbows, wrist, hip, knee and ankles bilaterally.  Neck: Inspection unremarkable. No palpable stepoffs. Negative Spurling's maneuver. Full neck range of motion Grip strength and sensation normal in bilateral hands Strength good C4 to T1 distribution No sensory change to C4 to T1 Negative Hoffman sign bilaterally Reflexes normal  Back  Exam:  Inspection: Unremarkable  Motion: Flexion 45 deg, Extension 45 deg, Side Bending to 45 deg bilaterally,  Rotation to 45 deg bilaterally  SLR laying: Negative  XSLR laying: Negative  Palpable tenderness: mild tenderness to palpation around the medial aspect of the scapula on the right side. Mild spasm of the trapezius noted.Marland Kitchen. FABER: negative. Sensory change: Gross sensation intact to all lumbar and sacral dermatomes.  Reflexes: 2+ at both patellar tendons, 2+ at achilles tendons, Babinski's downgoing.  Strength at foot  Plantar-flexion: 5/5 Dorsi-flexion: 5/5 Eversion: 5/5 Inversion: 5/5  Leg strength  Quad: 5/5 Hamstring: 5/5 Hip flexor: 5/5 Hip abductors: 4/5  Gait unremarkable.  Shoulder: Right Inspection reveals no abnormalities, atrophy or asymmetry. Palpation is normal with no tenderness over AC joint or bicipital groove. ROM is full in all planes. Rotator cuff strength normal throughout. No signs of impingement with negative Neer and Hawkin's tests, empty can sign. Speeds and Yergason's tests normal. No labral pathology noted with negative Obrien's, negative clunk and good stability. Normal scapular function observed. No painful arc and no drop arm sign. No apprehension sign shoulder unremarkable   Osteopathic findings C2 flexed rotated and side bent right T3 extended rotated and side bent left  T5 extended rotated and side bent right with inhaled fifth rib T7 extended rotated and side bent right L2 flexed rotated and side bent right     Impression and Recommendations:     This case required medical decision making of moderate complexity.      Note: This dictation was prepared with Dragon dictation along with smaller phrase technology. Any transcriptional errors that result from this process are unintentional.

## 2015-06-18 NOTE — Assessment & Plan Note (Signed)
I believe the patient did have more of a strain in the back that possibly could've caused a slipped rib syndrome. Patient even home exercises, patient given topical anti-inflammatories and prescription female then. We discussed icing regimen. Patient will work on posture. Did respond well to osteopathic manipulation. Follow-up again in 3-4 weeks.

## 2015-06-26 ENCOUNTER — Encounter: Payer: Self-pay | Admitting: Family Medicine

## 2015-06-26 MED ORDER — MELOXICAM 15 MG PO TABS: 15.0000 mg | ORAL_TABLET | Freq: Every day | ORAL | Status: DC

## 2015-06-26 NOTE — Telephone Encounter (Signed)
done

## 2015-07-01 ENCOUNTER — Telehealth: Payer: Self-pay | Admitting: *Deleted

## 2015-07-01 ENCOUNTER — Ambulatory Visit: Payer: Self-pay

## 2015-07-01 ENCOUNTER — Ambulatory Visit (INDEPENDENT_AMBULATORY_CARE_PROVIDER_SITE_OTHER): Payer: 59

## 2015-07-01 DIAGNOSIS — L237 Allergic contact dermatitis due to plants, except food: Secondary | ICD-10-CM

## 2015-07-01 MED ORDER — CLOBETASOL PROPIONATE 0.05 % EX OINT: 1.0000 "application " | TOPICAL_OINTMENT | Freq: Two times a day (BID) | CUTANEOUS | Status: DC

## 2015-07-01 MED ORDER — METHYLPREDNISOLONE ACETATE 80 MG/ML IJ SUSP
120.0000 mg | Freq: Once | INTRAMUSCULAR | Status: AC
  Administered 2015-07-01: 120 mg via INTRAMUSCULAR

## 2015-07-01 NOTE — Telephone Encounter (Signed)
Pt informed

## 2015-07-01 NOTE — Telephone Encounter (Signed)
pls give her an inj of depo-medrol 180 mg IM I sent a steroid rx to her pharmacy

## 2015-07-01 NOTE — Telephone Encounter (Signed)
Pt c/o severe itching and rash after exposure to poison ivy on Saturday while working in her yard. She is requesting a rx and injection. Please advise.

## 2015-07-07 ENCOUNTER — Other Ambulatory Visit: Payer: Self-pay

## 2015-07-07 MED ORDER — PHENTERMINE HCL 30 MG PO CAPS: 30.0000 mg | ORAL_CAPSULE | ORAL | Status: DC

## 2015-07-07 NOTE — Telephone Encounter (Signed)
Ok to fill 

## 2015-08-14 ENCOUNTER — Encounter: Payer: Self-pay | Admitting: Internal Medicine

## 2015-08-14 ENCOUNTER — Other Ambulatory Visit (INDEPENDENT_AMBULATORY_CARE_PROVIDER_SITE_OTHER): Payer: 59

## 2015-08-14 ENCOUNTER — Ambulatory Visit (INDEPENDENT_AMBULATORY_CARE_PROVIDER_SITE_OTHER): Payer: 59 | Admitting: Internal Medicine

## 2015-08-14 VITALS — BP 122/84 | HR 77 | Temp 98.4°F | Resp 16 | Ht 65.0 in | Wt 183.0 lb

## 2015-08-14 DIAGNOSIS — Z0000 Encounter for general adult medical examination without abnormal findings: Secondary | ICD-10-CM

## 2015-08-14 LAB — CBC WITH DIFFERENTIAL/PLATELET
Basophils Absolute: 0.1 10*3/uL (ref 0.0–0.1)
Basophils Relative: 0.5 % (ref 0.0–3.0)
EOS PCT: 1.4 % (ref 0.0–5.0)
Eosinophils Absolute: 0.2 10*3/uL (ref 0.0–0.7)
HCT: 41.8 % (ref 36.0–46.0)
Hemoglobin: 14.1 g/dL (ref 12.0–15.0)
LYMPHS ABS: 2.8 10*3/uL (ref 0.7–4.0)
Lymphocytes Relative: 21.2 % (ref 12.0–46.0)
MCHC: 33.6 g/dL (ref 30.0–36.0)
MCV: 90.4 fl (ref 78.0–100.0)
MONOS PCT: 3.9 % (ref 3.0–12.0)
Monocytes Absolute: 0.5 10*3/uL (ref 0.1–1.0)
NEUTROS ABS: 9.8 10*3/uL — AB (ref 1.4–7.7)
Neutrophils Relative %: 73 % (ref 43.0–77.0)
Platelets: 272 10*3/uL (ref 150.0–400.0)
RBC: 4.63 Mil/uL (ref 3.87–5.11)
RDW: 13.1 % (ref 11.5–15.5)
WBC: 13.4 10*3/uL — ABNORMAL HIGH (ref 4.0–10.5)

## 2015-08-14 LAB — COMPREHENSIVE METABOLIC PANEL
ALT: 13 U/L (ref 0–35)
AST: 14 U/L (ref 0–37)
Albumin: 4.4 g/dL (ref 3.5–5.2)
Alkaline Phosphatase: 44 U/L (ref 39–117)
BUN: 12 mg/dL (ref 6–23)
CO2: 29 meq/L (ref 19–32)
Calcium: 9.4 mg/dL (ref 8.4–10.5)
Chloride: 108 mEq/L (ref 96–112)
Creatinine, Ser: 0.83 mg/dL (ref 0.40–1.20)
GFR: 83.36 mL/min (ref >60.00)
GLUCOSE: 100 mg/dL — AB (ref 70–99)
POTASSIUM: 5 meq/L (ref 3.5–5.1)
SODIUM: 140 meq/L (ref 135–145)
Total Bilirubin: 0.5 mg/dL (ref 0.2–1.2)
Total Protein: 7.2 g/dL (ref 6.0–8.3)

## 2015-08-14 LAB — LIPID PANEL
CHOL/HDL RATIO: 3
Cholesterol: 166 mg/dL (ref 0–200)
HDL: 54.9 mg/dL (ref >39.00)
LDL Cholesterol: 97 mg/dL (ref 0–99)
NONHDL: 111.26
Triglycerides: 69 mg/dL (ref 0.0–149.0)
VLDL: 13.8 mg/dL (ref 0.0–40.0)

## 2015-08-14 LAB — TSH: TSH: 1.3 u[IU]/mL (ref 0.35–4.50)

## 2015-08-14 NOTE — Progress Notes (Signed)
Subjective:  Patient ID: Christina [name], female    DOB: February 25, 1981  Age: 35 y.o. MRN: [id number]  CC: Annual Exam   HPI Christina [name] presents for a CPX.  She feels well and offers no complaints.    Past Medical History  Diagnosis Date  . Ovarian cyst   . Postpartum hemorrhage   . Normal pregnancy 03/11/2012  . SVD (spontaneous vaginal delivery) 03/11/2012  . Migraine    No past surgical history on file.  reports that she has been smoking Cigarettes.  She has a 10 pack-year smoking history. She does not have any smokeless tobacco history on file. She reports that she does not drink alcohol or use illicit drugs. family history includes Arthritis in her father, maternal grandfather, and maternal grandmother; Bipolar disorder in her mother; Breast cancer in her maternal grandmother; Cancer in her father; Heart disease in her maternal grandfather and maternal grandmother; Hyperlipidemia in her maternal grandfather and paternal grandmother; Lung cancer in her maternal grandmother; Stroke in her paternal grandfather; Sudden death in her brother; Sudden death (age of onset: 50) in her father. Allergies  Allergen Reactions  . Hydrocodone Itching  . Prednisone Other (See Comments)    Hyper, agitated, aggressive    Outpatient Prescriptions Prior to Visit  Medication Sig Dispense Refill  . Diclofenac Sodium (PENNSAID) 2 % SOLN Place 2 application onto the skin 2 (two) times daily. 112 g 3  . etonogestrel-ethinyl estradiol (NUVARING) 0.12-0.015 MG/24HR vaginal ring Place 1 each vaginally every 28 (twenty-eight) days. Insert vaginally and leave in place for 3 consecutive weeks, then remove for 1 week.    . meloxicam (MOBIC) 15 MG tablet Take 1 tablet (15 mg total) by mouth daily. 90 tablet 1  . Multiple Vitamin (MULTIVITAMIN) tablet Take 1 tablet by mouth daily.    . Omega-3 Fatty Acids (OMEGA 3 PO) Take by mouth.    . phentermine 30 MG capsule Take 1 capsule (30 mg total) by  mouth every morning. Reported on 04/24/2015 30 capsule 2  . vitamin B-12 (CYANOCOBALAMIN) 1000 MCG tablet Take 1,000 mcg by mouth daily.    . Vortioxetine HBr (TRINTELLIX) 20 MG TABS Take 20 mg by mouth daily. 30 tablet 11  . clobetasol ointment (TEMOVATE) 0.05 % Apply 1 application topically 2 (two) times daily. 60 g 1  . cyclobenzaprine (FLEXERIL) 10 MG tablet Take 1 tablet (10 mg total) by mouth at bedtime. 30 tablet 0   No facility-administered medications prior to visit.    ROS Review of Systems  Constitutional: Negative.  Negative for fever, chills, diaphoresis, appetite change and fatigue.  HENT: Negative.  Negative for congestion, sinus pressure, sore throat and trouble swallowing.   Eyes: Negative.  Negative for visual disturbance.  Respiratory: Negative.  Negative for cough, choking, chest tightness, shortness of breath and stridor.   Cardiovascular: Negative.  Negative for chest pain, palpitations and leg swelling.  Gastrointestinal: Negative.  Negative for nausea, vomiting, abdominal pain, diarrhea, constipation and blood in stool.  Endocrine: Negative.   Genitourinary: Negative.  Negative for dysuria, hematuria, flank pain, vaginal bleeding, vaginal discharge and difficulty urinating.  Musculoskeletal: Negative.  Negative for myalgias, back pain, joint swelling and arthralgias.  Skin: Negative.  Negative for color change and rash.  Allergic/Immunologic: Negative.   Neurological: Negative.  Negative for dizziness.  Hematological: Negative.  Negative for adenopathy. Does not bruise/bleed easily.  Psychiatric/Behavioral: Negative.     Objective:  BP 122/84 mmHg  Pulse 77  Temp(Src) 98.4 F (  36.9 C) (Oral)  Resp 16  Ht 5\' 5"  (1.651 m)  Wt 183 lb (83.008 kg)  BMI 30.45 kg/m2  SpO2 97%  LMP 07/14/2015  BP Readings from Last 3 Encounters:  08/14/15 122/84  04/24/15 118/80  02/26/15 126/78    Wt Readings from Last 3 Encounters:  08/14/15 183 lb (83.008 kg)  04/24/15  189 lb (85.73 kg)  02/26/15 192 lb 4 oz (87.204 kg)    Physical Exam  Constitutional: She is oriented to person, place, and time. She appears well-developed and well-nourished. No distress.  HENT:  Head: Normocephalic and atraumatic.  Mouth/Throat: Oropharynx is clear and moist. No oropharyngeal exudate.  Eyes: Conjunctivae are normal. Right eye exhibits no discharge. Left eye exhibits no discharge. No scleral icterus.  Neck: Normal range of motion. Neck supple. No JVD present. No tracheal deviation present. No thyromegaly present.  Cardiovascular: Normal rate, regular rhythm, normal heart sounds and intact distal pulses.  Exam reveals no gallop and no friction rub.   No murmur heard. Pulmonary/Chest: Effort normal and breath sounds normal. No stridor. No respiratory distress. She has no wheezes. She has no rales. She exhibits no tenderness.  Abdominal: Soft. Bowel sounds are normal. She exhibits no distension and no mass. There is no tenderness. There is no rebound and no guarding.  Musculoskeletal: Normal range of motion. She exhibits no edema or tenderness.  Lymphadenopathy:    She has no cervical adenopathy.  Neurological: She is oriented to person, place, and time.  Skin: Skin is warm and dry. No rash noted. She is not diaphoretic. No erythema. No pallor.  Vitals reviewed.   Lab Results  Component Value Date   WBC 13.4* 08/14/2015   HGB 14.1 08/14/2015   HCT 41.8 08/14/2015   PLT 272.0 08/14/2015   GLUCOSE 100* 08/14/2015   CHOL 166 08/14/2015   TRIG 69.0 08/14/2015   HDL 54.90 08/14/2015   LDLCALC 97 08/14/2015   ALT 13 08/14/2015   AST 14 08/14/2015   NA 140 08/14/2015   K 5.0 08/14/2015   CL 108 08/14/2015   CREATININE 0.83 08/14/2015   BUN 12 08/14/2015   CO2 29 08/14/2015   TSH 1.30 08/14/2015   HGBA1C 5.5 08/29/2014    Dg Tibia/fibula Left  10/29/2014  CLINICAL DATA:  Injury.  Posttraumatic wound infection. EXAM: LEFT TIBIA AND FIBULA - 2 VIEW COMPARISON:   None. FINDINGS: No acute soft tissue or bony abnormality identified. No radiopaque foreign bodies. IMPRESSION: No acute abnormality. Electronically Signed   By: Maisie Fushomas  Register   On: 10/29/2014 08:11    Assessment & Plan:   [name] was seen today for annual exam.  Diagnoses and all orders for this visit:  Routine general medical examination at a health care facility- Exam completed, labs ordered and reviewed, her Pap is up-to-date, vaccines were reviewed, she was given patient education material. -     Lipid panel; Future -     Comprehensive metabolic panel; Future -     CBC with Differential/Platelet; Future -     TSH; Future -     HIV antibody; Future   I have discontinued Ms. Davidson's cyclobenzaprine and clobetasol ointment. I am also having her maintain her multivitamin, etonogestrel-ethinyl estradiol, vitamin B-12, Omega-3 Fatty Acids (OMEGA 3 PO), Vortioxetine HBr, Diclofenac Sodium, meloxicam, and phentermine.  No orders of the defined types were placed in this encounter.     Follow-up: Return if symptoms worsen or fail to improve.  Sanda Lingerhomas Jones, MD

## 2015-08-14 NOTE — Progress Notes (Signed)
Pre visit review using our clinic review tool, if applicable. No additional management support is needed unless otherwise documented below in the visit note. 

## 2015-08-14 NOTE — Patient Instructions (Signed)
Preventive Care for Adults, Female A healthy lifestyle and preventive care can promote health and wellness. Preventive health guidelines for women include the following key practices.  A routine yearly physical is a good way to check with your health care provider about your health and preventive screening. It is a chance to share any concerns and updates on your health and to receive a thorough exam.  Visit your dentist for a routine exam and preventive care every 6 months. Brush your teeth twice a day and floss once a day. Good oral hygiene prevents tooth decay and gum disease.  The frequency of eye exams is based on your age, health, family medical history, use of contact lenses, and other factors. Follow your health care provider's recommendations for frequency of eye exams.  Eat a healthy diet. Foods like vegetables, fruits, whole grains, low-fat dairy products, and lean protein foods contain the nutrients you need without too many calories. Decrease your intake of foods high in solid fats, added sugars, and salt. Eat the right amount of calories for you.Get information about a proper diet from your health care provider, if necessary.  Regular physical exercise is one of the most important things you can do for your health. Most adults should get at least 150 minutes of moderate-intensity exercise (any activity that increases your heart rate and causes you to sweat) each week. In addition, most adults need muscle-strengthening exercises on 2 or more days a week.  Maintain a healthy weight. The body mass index (BMI) is a screening tool to identify possible weight problems. It provides an estimate of body fat based on height and weight. Your health care provider can find your BMI and can help you achieve or maintain a healthy weight.For adults 20 years and older:  A BMI below 18.5 is considered underweight.  A BMI of 18.5 to 24.9 is normal.  A BMI of 25 to 29.9 is considered overweight.  A  BMI of 30 and above is considered obese.  Maintain normal blood lipids and cholesterol levels by exercising and minimizing your intake of saturated fat. Eat a balanced diet with plenty of fruit and vegetables. Blood tests for lipids and cholesterol should begin at age 45 and be repeated every 5 years. If your lipid or cholesterol levels are high, you are over 50, or you are at high risk for heart disease, you may need your cholesterol levels checked more frequently.Ongoing high lipid and cholesterol levels should be treated with medicines if diet and exercise are not working.  If you smoke, find out from your health care provider how to quit. If you do not use tobacco, do not start.  Lung cancer screening is recommended for adults aged 45-80 years who are at high risk for developing lung cancer because of a history of smoking. A yearly low-dose CT scan of the lungs is recommended for people who have at least a 30-pack-year history of smoking and are a current smoker or have quit within the past 15 years. A pack year of smoking is smoking an average of 1 pack of cigarettes a day for 1 year (for example: 1 pack a day for 30 years or 2 packs a day for 15 years). Yearly screening should continue until the smoker has stopped smoking for at least 15 years. Yearly screening should be stopped for people who develop a health problem that would prevent them from having lung cancer treatment.  If you are pregnant, do not drink alcohol. If you are  breastfeeding, be very cautious about drinking alcohol. If you are not pregnant and choose to drink alcohol, do not have more than 1 drink per day. One drink is considered to be 12 ounces (355 mL) of beer, 5 ounces (148 mL) of wine, or 1.5 ounces (44 mL) of liquor.  Avoid use of street drugs. Do not share needles with anyone. Ask for help if you need support or instructions about stopping the use of drugs.  High blood pressure causes heart disease and increases the risk  of stroke. Your blood pressure should be checked at least every 1 to 2 years. Ongoing high blood pressure should be treated with medicines if weight loss and exercise do not work.  If you are 55-79 years old, ask your health care provider if you should take aspirin to prevent strokes.  Diabetes screening is done by taking a blood sample to check your blood glucose level after you have not eaten for a certain period of time (fasting). If you are not overweight and you do not have risk factors for diabetes, you should be screened once every 3 years starting at age 45. If you are overweight or obese and you are 40-70 years of age, you should be screened for diabetes every year as part of your cardiovascular risk assessment.  Breast cancer screening is essential preventive care for women. You should practice "breast self-awareness." This means understanding the normal appearance and feel of your breasts and may include breast self-examination. Any changes detected, no matter how small, should be reported to a health care provider. Women in their 20s and 30s should have a clinical breast exam (CBE) by a health care provider as part of a regular health exam every 1 to 3 years. After age 40, women should have a CBE every year. Starting at age 40, women should consider having a mammogram (breast X-ray test) every year. Women who have a family history of breast cancer should talk to their health care provider about genetic screening. Women at a high risk of breast cancer should talk to their health care providers about having an MRI and a mammogram every year.  Breast cancer gene (BRCA)-related cancer risk assessment is recommended for women who have family members with BRCA-related cancers. BRCA-related cancers include breast, ovarian, tubal, and peritoneal cancers. Having family members with these cancers may be associated with an increased risk for harmful changes (mutations) in the breast cancer genes BRCA1 and  BRCA2. Results of the assessment will determine the need for genetic counseling and BRCA1 and BRCA2 testing.  Your health care provider may recommend that you be screened regularly for cancer of the pelvic organs (ovaries, uterus, and vagina). This screening involves a pelvic examination, including checking for microscopic changes to the surface of your cervix (Pap test). You may be encouraged to have this screening done every 3 years, beginning at age 21.  For women ages 30-65, health care providers may recommend pelvic exams and Pap testing every 3 years, or they may recommend the Pap and pelvic exam, combined with testing for human papilloma virus (HPV), every 5 years. Some types of HPV increase your risk of cervical cancer. Testing for HPV may also be done on women of any age with unclear Pap test results.  Other health care providers may not recommend any screening for nonpregnant women who are considered low risk for pelvic cancer and who do not have symptoms. Ask your health care provider if a screening pelvic exam is right for   you.  If you have had past treatment for cervical cancer or a condition that could lead to cancer, you need Pap tests and screening for cancer for at least 20 years after your treatment. If Pap tests have been discontinued, your risk factors (such as having a new sexual partner) need to be reassessed to determine if screening should resume. Some women have medical problems that increase the chance of getting cervical cancer. In these cases, your health care provider may recommend more frequent screening and Pap tests.  Colorectal cancer can be detected and often prevented. Most routine colorectal cancer screening begins at the age of 50 years and continues through age 75 years. However, your health care provider may recommend screening at an earlier age if you have risk factors for colon cancer. On a yearly basis, your health care provider may provide home test kits to check  for hidden blood in the stool. Use of a small camera at the end of a tube, to directly examine the colon (sigmoidoscopy or colonoscopy), can detect the earliest forms of colorectal cancer. Talk to your health care provider about this at age 50, when routine screening begins. Direct exam of the colon should be repeated every 5-10 years through age 75 years, unless early forms of precancerous polyps or small growths are found.  People who are at an increased risk for hepatitis B should be screened for this virus. You are considered at high risk for hepatitis B if:  You were born in a country where hepatitis B occurs often. Talk with your health care provider about which countries are considered high risk.  Your parents were born in a high-risk country and you have not received a shot to protect against hepatitis B (hepatitis B vaccine).  You have HIV or AIDS.  You use needles to inject street drugs.  You live with, or have sex with, someone who has hepatitis B.  You get hemodialysis treatment.  You take certain medicines for conditions like cancer, organ transplantation, and autoimmune conditions.  Hepatitis C blood testing is recommended for all people born from 1945 through 1965 and any individual with known risks for hepatitis C.  Practice safe sex. Use condoms and avoid high-risk sexual practices to reduce the spread of sexually transmitted infections (STIs). STIs include gonorrhea, chlamydia, syphilis, trichomonas, herpes, HPV, and human immunodeficiency virus (HIV). Herpes, HIV, and HPV are viral illnesses that have no cure. They can result in disability, cancer, and death.  You should be screened for sexually transmitted illnesses (STIs) including gonorrhea and chlamydia if:  You are sexually active and are younger than 24 years.  You are older than 24 years and your health care provider tells you that you are at risk for this type of infection.  Your sexual activity has changed  since you were last screened and you are at an increased risk for chlamydia or gonorrhea. Ask your health care provider if you are at risk.  If you are at risk of being infected with HIV, it is recommended that you take a prescription medicine daily to prevent HIV infection. This is called preexposure prophylaxis (PrEP). You are considered at risk if:  You are sexually active and do not regularly use condoms or know the HIV status of your partner(s).  You take drugs by injection.  You are sexually active with a partner who has HIV.  Talk with your health care provider about whether you are at high risk of being infected with HIV. If   you choose to begin PrEP, you should first be tested for HIV. You should then be tested every 3 months for as long as you are taking PrEP.  Osteoporosis is a disease in which the bones lose minerals and strength with aging. This can result in serious bone fractures or breaks. The risk of osteoporosis can be identified using a bone density scan. Women ages 67 years and over and women at risk for fractures or osteoporosis should discuss screening with their health care providers. Ask your health care provider whether you should take a calcium supplement or vitamin D to reduce the rate of osteoporosis.  Menopause can be associated with physical symptoms and risks. Hormone replacement therapy is available to decrease symptoms and risks. You should talk to your health care provider about whether hormone replacement therapy is right for you.  Use sunscreen. Apply sunscreen liberally and repeatedly throughout the day. You should seek shade when your shadow is shorter than you. Protect yourself by wearing long sleeves, pants, a wide-brimmed hat, and sunglasses year round, whenever you are outdoors.  Once a month, do a whole body skin exam, using a mirror to look at the skin on your back. Tell your health care provider of new moles, moles that have irregular borders, moles that  are larger than a pencil eraser, or moles that have changed in shape or color.  Stay current with required vaccines (immunizations).  Influenza vaccine. All adults should be immunized every year.  Tetanus, diphtheria, and acellular pertussis (Td, Tdap) vaccine. Pregnant women should receive 1 dose of Tdap vaccine during each pregnancy. The dose should be obtained regardless of the length of time since the last dose. Immunization is preferred during the 27th-36th week of gestation. An adult who has not previously received Tdap or who does not know her vaccine status should receive 1 dose of Tdap. This initial dose should be followed by tetanus and diphtheria toxoids (Td) booster doses every 10 years. Adults with an unknown or incomplete history of completing a 3-dose immunization series with Td-containing vaccines should begin or complete a primary immunization series including a Tdap dose. Adults should receive a Td booster every 10 years.  Varicella vaccine. An adult without evidence of immunity to varicella should receive 2 doses or a second dose if she has previously received 1 dose. Pregnant females who do not have evidence of immunity should receive the first dose after pregnancy. This first dose should be obtained before leaving the health care facility. The second dose should be obtained 4-8 weeks after the first dose.  Human papillomavirus (HPV) vaccine. Females aged 13-26 years who have not received the vaccine previously should obtain the 3-dose series. The vaccine is not recommended for use in pregnant females. However, pregnancy testing is not needed before receiving a dose. If a female is found to be pregnant after receiving a dose, no treatment is needed. In that case, the remaining doses should be delayed until after the pregnancy. Immunization is recommended for any person with an immunocompromised condition through the age of 61 years if she did not get any or all doses earlier. During the  3-dose series, the second dose should be obtained 4-8 weeks after the first dose. The third dose should be obtained 24 weeks after the first dose and 16 weeks after the second dose.  Zoster vaccine. One dose is recommended for adults aged 30 years or older unless certain conditions are present.  Measles, mumps, and rubella (MMR) vaccine. Adults born  before 1957 generally are considered immune to measles and mumps. Adults born in 1957 or later should have 1 or more doses of MMR vaccine unless there is a contraindication to the vaccine or there is laboratory evidence of immunity to each of the three diseases. A routine second dose of MMR vaccine should be obtained at least 28 days after the first dose for students attending postsecondary schools, health care workers, or international travelers. People who received inactivated measles vaccine or an unknown type of measles vaccine during 1963-1967 should receive 2 doses of MMR vaccine. People who received inactivated mumps vaccine or an unknown type of mumps vaccine before 1979 and are at high risk for mumps infection should consider immunization with 2 doses of MMR vaccine. For females of childbearing age, rubella immunity should be determined. If there is no evidence of immunity, females who are not pregnant should be vaccinated. If there is no evidence of immunity, females who are pregnant should delay immunization until after pregnancy. Unvaccinated health care workers born before 1957 who lack laboratory evidence of measles, mumps, or rubella immunity or laboratory confirmation of disease should consider measles and mumps immunization with 2 doses of MMR vaccine or rubella immunization with 1 dose of MMR vaccine.  Pneumococcal 13-valent conjugate (PCV13) vaccine. When indicated, a person who is uncertain of his immunization history and has no record of immunization should receive the PCV13 vaccine. All adults 65 years of age and older should receive this  vaccine. An adult aged 19 years or older who has certain medical conditions and has not been previously immunized should receive 1 dose of PCV13 vaccine. This PCV13 should be followed with a dose of pneumococcal polysaccharide (PPSV23) vaccine. Adults who are at high risk for pneumococcal disease should obtain the PPSV23 vaccine at least 8 weeks after the dose of PCV13 vaccine. Adults older than 35 years of age who have normal immune system function should obtain the PPSV23 vaccine dose at least 1 year after the dose of PCV13 vaccine.  Pneumococcal polysaccharide (PPSV23) vaccine. When PCV13 is also indicated, PCV13 should be obtained first. All adults aged 65 years and older should be immunized. An adult younger than age 65 years who has certain medical conditions should be immunized. Any person who resides in a nursing home or long-term care facility should be immunized. An adult smoker should be immunized. People with an immunocompromised condition and certain other conditions should receive both PCV13 and PPSV23 vaccines. People with human immunodeficiency virus (HIV) infection should be immunized as soon as possible after diagnosis. Immunization during chemotherapy or radiation therapy should be avoided. Routine use of PPSV23 vaccine is not recommended for American Indians, [place] Natives, or people younger than 65 years unless there are medical conditions that require PPSV23 vaccine. When indicated, people who have unknown immunization and have no record of immunization should receive PPSV23 vaccine. One-time revaccination 5 years after the first dose of PPSV23 is recommended for people aged 19-64 years who have chronic kidney failure, nephrotic syndrome, asplenia, or immunocompromised conditions. People who received 1-2 doses of PPSV23 before age 65 years should receive another dose of PPSV23 vaccine at age 65 years or later if at least 5 years have passed since the previous dose. Doses of PPSV23 are not  needed for people immunized with PPSV23 at or after age 65 years.  Meningococcal vaccine. Adults with asplenia or persistent complement component deficiencies should receive 2 doses of quadrivalent meningococcal conjugate (MenACWY-D) vaccine. The doses should be obtained   at least 2 months apart. Microbiologists working with certain meningococcal bacteria, [place] recruits, people at risk during an outbreak, and people who travel to or live in countries with a high rate of meningitis should be immunized. A first-year college student up through age 34 years who is living in a residence hall should receive a dose if she did not receive a dose on or after her 16th birthday. Adults who have certain high-risk conditions should receive one or more doses of vaccine.  Hepatitis A vaccine. Adults who wish to be protected from this disease, have certain high-risk conditions, work with hepatitis A-infected animals, work in hepatitis A research labs, or travel to or work in countries with a high rate of hepatitis A should be immunized. Adults who were previously unvaccinated and who anticipate close contact with an international adoptee during the first 60 days after arrival in the [place] from a country with a high rate of hepatitis A should be immunized.  Hepatitis B vaccine. Adults who wish to be protected from this disease, have certain high-risk conditions, may be exposed to blood or other infectious body fluids, are household contacts or sex partners of hepatitis B positive people, are clients or workers in certain care facilities, or travel to or work in countries with a high rate of hepatitis B should be immunized.  Haemophilus influenzae type b (Hib) vaccine. A previously unvaccinated person with asplenia or sickle cell disease or having a scheduled splenectomy should receive 1 dose of Hib vaccine. Regardless of previous immunization, a recipient of a hematopoietic stem cell transplant should receive a  3-dose series 6-12 months after her successful transplant. Hib vaccine is not recommended for adults with HIV infection. Preventive Services / Frequency Ages 35 to 4 years  Blood pressure check.** / Every 3-5 years.  Lipid and cholesterol check.** / Every 5 years beginning at age 60.  Clinical breast exam.** / Every 3 years for women in their 71s and 10s.  BRCA-related cancer risk assessment.** / For women who have family members with a BRCA-related cancer (breast, ovarian, tubal, or peritoneal cancers).  Pap test.** / Every 2 years from ages 76 through 26. Every 3 years starting at age 61 through age 76 or 93 with a history of 3 consecutive normal Pap tests.  HPV screening.** / Every 3 years from ages 37 through ages 60 to 51 with a history of 3 consecutive normal Pap tests.  Hepatitis C blood test.** / For any individual with known risks for hepatitis C.  Skin self-exam. / Monthly.  Influenza vaccine. / Every year.  Tetanus, diphtheria, and acellular pertussis (Tdap, Td) vaccine.** / Consult your health care provider. Pregnant women should receive 1 dose of Tdap vaccine during each pregnancy. 1 dose of Td every 10 years.  Varicella vaccine.** / Consult your health care provider. Pregnant females who do not have evidence of immunity should receive the first dose after pregnancy.  HPV vaccine. / 3 doses over 6 months, if 93 and younger. The vaccine is not recommended for use in pregnant females. However, pregnancy testing is not needed before receiving a dose.  Measles, mumps, rubella (MMR) vaccine.** / You need at least 1 dose of MMR if you were born in 1957 or later. You may also need a 2nd dose. For females of childbearing age, rubella immunity should be determined. If there is no evidence of immunity, females who are not pregnant should be vaccinated. If there is no evidence of immunity, females who are  pregnant should delay immunization until after pregnancy.  Pneumococcal  13-valent conjugate (PCV13) vaccine.** / Consult your health care provider.  Pneumococcal polysaccharide (PPSV23) vaccine.** / 1 to 2 doses if you smoke cigarettes or if you have certain conditions.  Meningococcal vaccine.** / 1 dose if you are age 68 to 8 years and a Market researcher living in a residence hall, or have one of several medical conditions, you need to get vaccinated against meningococcal disease. You may also need additional booster doses.  Hepatitis A vaccine.** / Consult your health care provider.  Hepatitis B vaccine.** / Consult your health care provider.  Haemophilus influenzae type b (Hib) vaccine.** / Consult your health care provider. Ages 7 to 53 years  Blood pressure check.** / Every year.  Lipid and cholesterol check.** / Every 5 years beginning at age 25 years.  Lung cancer screening. / Every year if you are aged 11-80 years and have a 30-pack-year history of smoking and currently smoke or have quit within the past 15 years. Yearly screening is stopped once you have quit smoking for at least 15 years or develop a health problem that would prevent you from having lung cancer treatment.  Clinical breast exam.** / Every year after age 48 years.  BRCA-related cancer risk assessment.** / For women who have family members with a BRCA-related cancer (breast, ovarian, tubal, or peritoneal cancers).  Mammogram.** / Every year beginning at age 41 years and continuing for as long as you are in good health. Consult with your health care provider.  Pap test.** / Every 3 years starting at age 65 years through age 37 or 70 years with a history of 3 consecutive normal Pap tests.  HPV screening.** / Every 3 years from ages 72 years through ages 60 to 40 years with a history of 3 consecutive normal Pap tests.  Fecal occult blood test (FOBT) of stool. / Every year beginning at age 21 years and continuing until age 5 years. You may not need to do this test if you get  a colonoscopy every 10 years.  Flexible sigmoidoscopy or colonoscopy.** / Every 5 years for a flexible sigmoidoscopy or every 10 years for a colonoscopy beginning at age 35 years and continuing until age 48 years.  Hepatitis C blood test.** / For all people born from [birth year] through 1965 and any individual with known risks for hepatitis C.  Skin self-exam. / Monthly.  Influenza vaccine. / Every year.  Tetanus, diphtheria, and acellular pertussis (Tdap/Td) vaccine.** / Consult your health care provider. Pregnant women should receive 1 dose of Tdap vaccine during each pregnancy. 1 dose of Td every 10 years.  Varicella vaccine.** / Consult your health care provider. Pregnant females who do not have evidence of immunity should receive the first dose after pregnancy.  Zoster vaccine.** / 1 dose for adults aged 30 years or older.  Measles, mumps, rubella (MMR) vaccine.** / You need at least 1 dose of MMR if you were born in 1957 or later. You may also need a second dose. For females of childbearing age, rubella immunity should be determined. If there is no evidence of immunity, females who are not pregnant should be vaccinated. If there is no evidence of immunity, females who are pregnant should delay immunization until after pregnancy.  Pneumococcal 13-valent conjugate (PCV13) vaccine.** / Consult your health care provider.  Pneumococcal polysaccharide (PPSV23) vaccine.** / 1 to 2 doses if you smoke cigarettes or if you have certain conditions.  Meningococcal vaccine.** /  Consult your health care provider.  Hepatitis A vaccine.** / Consult your health care provider.  Hepatitis B vaccine.** / Consult your health care provider.  Haemophilus influenzae type b (Hib) vaccine.** / Consult your health care provider. Ages 64 years and over  Blood pressure check.** / Every year.  Lipid and cholesterol check.** / Every 5 years beginning at age 23 years.  Lung cancer screening. / Every year if you  are aged 16-80 years and have a 30-pack-year history of smoking and currently smoke or have quit within the past 15 years. Yearly screening is stopped once you have quit smoking for at least 15 years or develop a health problem that would prevent you from having lung cancer treatment.  Clinical breast exam.** / Every year after age 74 years.  BRCA-related cancer risk assessment.** / For women who have family members with a BRCA-related cancer (breast, ovarian, tubal, or peritoneal cancers).  Mammogram.** / Every year beginning at age 44 years and continuing for as long as you are in good health. Consult with your health care provider.  Pap test.** / Every 3 years starting at age 58 years through age 22 or 39 years with 3 consecutive normal Pap tests. Testing can be stopped between 65 and 70 years with 3 consecutive normal Pap tests and no abnormal Pap or HPV tests in the past 10 years.  HPV screening.** / Every 3 years from ages 64 years through ages 70 or 61 years with a history of 3 consecutive normal Pap tests. Testing can be stopped between 65 and 70 years with 3 consecutive normal Pap tests and no abnormal Pap or HPV tests in the past 10 years.  Fecal occult blood test (FOBT) of stool. / Every year beginning at age 40 years and continuing until age 27 years. You may not need to do this test if you get a colonoscopy every 10 years.  Flexible sigmoidoscopy or colonoscopy.** / Every 5 years for a flexible sigmoidoscopy or every 10 years for a colonoscopy beginning at age 7 years and continuing until age 32 years.  Hepatitis C blood test.** / For all people born from [birth year] through 1965 and any individual with known risks for hepatitis C.  Osteoporosis screening.** / A one-time screening for women ages 30 years and over and women at risk for fractures or osteoporosis.  Skin self-exam. / Monthly.  Influenza vaccine. / Every year.  Tetanus, diphtheria, and acellular pertussis (Tdap/Td)  vaccine.** / 1 dose of Td every 10 years.  Varicella vaccine.** / Consult your health care provider.  Zoster vaccine.** / 1 dose for adults aged 35 years or older.  Pneumococcal 13-valent conjugate (PCV13) vaccine.** / Consult your health care provider.  Pneumococcal polysaccharide (PPSV23) vaccine.** / 1 dose for all adults aged 46 years and older.  Meningococcal vaccine.** / Consult your health care provider.  Hepatitis A vaccine.** / Consult your health care provider.  Hepatitis B vaccine.** / Consult your health care provider.  Haemophilus influenzae type b (Hib) vaccine.** / Consult your health care provider. ** Family history and personal history of risk and conditions may change your health care provider's recommendations.   This information is not intended to replace advice given to you by your health care provider. Make sure you discuss any questions you have with your health care provider.   Document Released: 05/04/2001 Document Revised: 03/29/2014 Document Reviewed: 08/03/2010 Elsevier Interactive Patient Education [employer].

## 2015-08-15 ENCOUNTER — Encounter: Payer: Self-pay | Admitting: Internal Medicine

## 2015-08-15 LAB — HIV ANTIBODY (ROUTINE TESTING W REFLEX): HIV 1&2 Ab, 4th Generation: NONREACTIVE

## 2015-08-19 ENCOUNTER — Ambulatory Visit (INDEPENDENT_AMBULATORY_CARE_PROVIDER_SITE_OTHER): Payer: 59 | Admitting: Internal Medicine

## 2015-08-19 ENCOUNTER — Encounter: Payer: Self-pay | Admitting: Internal Medicine

## 2015-08-19 ENCOUNTER — Other Ambulatory Visit (INDEPENDENT_AMBULATORY_CARE_PROVIDER_SITE_OTHER): Payer: 59

## 2015-08-19 VITALS — BP 110/90 | HR 68 | Temp 97.8°F | Resp 16 | Ht 65.0 in

## 2015-08-19 DIAGNOSIS — M791 Myalgia: Secondary | ICD-10-CM | POA: Diagnosis not present

## 2015-08-19 DIAGNOSIS — M609 Myositis, unspecified: Secondary | ICD-10-CM

## 2015-08-19 DIAGNOSIS — IMO0001 Reserved for inherently not codable concepts without codable children: Secondary | ICD-10-CM

## 2015-08-19 LAB — URINALYSIS, ROUTINE W REFLEX MICROSCOPIC
Bilirubin Urine: NEGATIVE
Ketones, ur: NEGATIVE
Leukocytes, UA: NEGATIVE
NITRITE: NEGATIVE
RBC / HPF: NONE SEEN (ref 0–?)
Specific Gravity, Urine: 1.02 (ref 1.000–1.030)
Total Protein, Urine: NEGATIVE
Urine Glucose: NEGATIVE
Urobilinogen, UA: 0.2 (ref 0.0–1.0)
pH: 6 (ref 5.0–8.0)

## 2015-08-19 LAB — CBC WITH DIFFERENTIAL/PLATELET
Basophils Absolute: 0 10*3/uL (ref 0.0–0.1)
Basophils Relative: 0.3 % (ref 0.0–3.0)
EOS PCT: 2.1 % (ref 0.0–5.0)
Eosinophils Absolute: 0.3 10*3/uL (ref 0.0–0.7)
HCT: 40.2 % (ref 36.0–46.0)
Hemoglobin: 13.7 g/dL (ref 12.0–15.0)
LYMPHS ABS: 2.4 10*3/uL (ref 0.7–4.0)
Lymphocytes Relative: 18.4 % (ref 12.0–46.0)
MCHC: 34 g/dL (ref 30.0–36.0)
MCV: 90.4 fl (ref 78.0–100.0)
MONO ABS: 0.7 10*3/uL (ref 0.1–1.0)
MONOS PCT: 5.2 % (ref 3.0–12.0)
NEUTROS ABS: 9.6 10*3/uL — AB (ref 1.4–7.7)
NEUTROS PCT: 74 % (ref 43.0–77.0)
PLATELETS: 239 10*3/uL (ref 150.0–400.0)
RBC: 4.45 Mil/uL (ref 3.87–5.11)
RDW: 13.4 % (ref 11.5–15.5)
WBC: 13 10*3/uL — ABNORMAL HIGH (ref 4.0–10.5)

## 2015-08-19 LAB — BASIC METABOLIC PANEL
BUN: 13 mg/dL (ref 6–23)
CALCIUM: 9 mg/dL (ref 8.4–10.5)
CO2: 27 meq/L (ref 19–32)
CREATININE: 0.77 mg/dL (ref 0.40–1.20)
Chloride: 108 mEq/L (ref 96–112)
GFR: 90.89 mL/min (ref >60.00)
GLUCOSE: 96 mg/dL (ref 70–99)
Potassium: 4.5 mEq/L (ref 3.5–5.1)
SODIUM: 139 meq/L (ref 135–145)

## 2015-08-19 LAB — CK: Total CK: 109 U/L (ref 7–177)

## 2015-08-19 MED ORDER — KETOROLAC TROMETHAMINE 60 MG/2ML IM SOLN
60.0000 mg | Freq: Once | INTRAMUSCULAR | Status: AC
  Administered 2015-08-19: 60 mg via INTRAMUSCULAR

## 2015-08-19 NOTE — Progress Notes (Signed)
Subjective:  Patient ID: Christina [name], female    DOB: 19-Aug-1980  Age: 35 y.o. MRN: [id number]  CC: Leg Pain   HPI Christina [name] presents for a 3 day history of diffuse, symmetrical pain in both lower extremities from the knees down. The symptoms started 3 days ago after she did 6 hours of heavy yard work. She describes a worsening diffuse soreness that has not responded to oral anti-inflammatories or Flexeril. She tells me the discomfort is constant but also worsens with activity.  Outpatient Prescriptions Prior to Visit  Medication Sig Dispense Refill  . Diclofenac Sodium (PENNSAID) 2 % SOLN Place 2 application onto the skin 2 (two) times daily. 112 g 3  . etonogestrel-ethinyl estradiol (NUVARING) 0.12-0.015 MG/24HR vaginal ring Place 1 each vaginally every 28 (twenty-eight) days. Insert vaginally and leave in place for 3 consecutive weeks, then remove for 1 week.    . meloxicam (MOBIC) 15 MG tablet Take 1 tablet (15 mg total) by mouth daily. 90 tablet 1  . Multiple Vitamin (MULTIVITAMIN) tablet Take 1 tablet by mouth daily.    . Omega-3 Fatty Acids (OMEGA 3 PO) Take by mouth.    . phentermine 30 MG capsule Take 1 capsule (30 mg total) by mouth every morning. Reported on 04/24/2015 30 capsule 2  . vitamin B-12 (CYANOCOBALAMIN) 1000 MCG tablet Take 1,000 mcg by mouth daily.    . Vortioxetine HBr (TRINTELLIX) 20 MG TABS Take 20 mg by mouth daily. 30 tablet 11   No facility-administered medications prior to visit.    ROS Review of Systems  Constitutional: Negative.  Negative for chills, diaphoresis and fatigue.  HENT: Negative.   Eyes: Negative.   Respiratory: Negative.  Negative for cough, choking, chest tightness, shortness of breath and stridor.   Cardiovascular: Negative.  Negative for chest pain, palpitations and leg swelling.  Gastrointestinal: Negative.  Negative for nausea, vomiting, abdominal pain, diarrhea and constipation.  Endocrine: Negative.     Genitourinary: Negative.   Musculoskeletal: Positive for myalgias. Negative for back pain, joint swelling, arthralgias, gait problem and neck pain.  Skin: Negative.  Negative for color change and rash.  Allergic/Immunologic: Negative.   Neurological: Negative.  Negative for dizziness, tremors, syncope, facial asymmetry, light-headedness, numbness and headaches.  Hematological: Negative.  Negative for adenopathy. Does not bruise/bleed easily.  Psychiatric/Behavioral: Negative.     Objective:  BP 110/90 mmHg  Pulse 68  Temp(Src) 97.8 F (36.6 C) (Oral)  Resp 16  Ht  (1.651 m)  SpO2 98%  LMP 07/14/2015  BP Readings from Last 3 Encounters:  08/19/15 110/90  08/14/15 122/84  04/24/15 118/80    Wt Readings from Last 3 Encounters:  08/14/15 183 lb (83.008 kg)  04/24/15 189 lb (85.73 kg)  02/26/15 192 lb 4 oz (87.204 kg)    Physical Exam  Constitutional: She is oriented to person, place, and time. No distress.  HENT:  Head: Normocephalic and atraumatic.  Mouth/Throat: Oropharynx is clear and moist. No oropharyngeal exudate.  Eyes: Conjunctivae are normal. Right eye exhibits no discharge. Left eye exhibits no discharge. No scleral icterus.  Neck: Normal range of motion. Neck supple. No JVD present. No tracheal deviation present. No thyromegaly present.  Cardiovascular: Normal rate, regular rhythm, normal heart sounds and intact distal pulses.  Exam reveals no gallop and no friction rub.   No murmur heard. Pulses:      Carotid pulses are 1+ on the right side, and 1+ on the left side.  Radial pulses are 1+ on the right side, and 1+ on the left side.       Femoral pulses are 1+ on the right side, and 1+ on the left side.      Popliteal pulses are 0 on the right side, and 0 on the left side.       Dorsalis pedis pulses are 1+ on the right side, and 1+ on the left side.       Posterior tibial pulses are 1+ on the right side, and 1+ on the left side.  Pulmonary/Chest:  Effort normal and breath sounds normal. No stridor. No respiratory distress. She has no wheezes. She has no rales. She exhibits no tenderness.  Abdominal: Soft. Bowel sounds are normal. She exhibits no distension and no mass. There is no tenderness. There is no rebound and no guarding.  Musculoskeletal: Normal range of motion. She exhibits no edema or tenderness.  Lymphadenopathy:    She has no cervical adenopathy.  Neurological: She is alert and oriented to person, place, and time. She has normal strength. She displays no atrophy, no tremor and normal reflexes. No cranial nerve deficit or sensory deficit. She exhibits normal muscle tone. She displays a negative Romberg sign. She displays no seizure activity. Coordination and gait normal.  Skin: Skin is warm and dry. No rash noted. She is not diaphoretic. No erythema. No pallor.  Vitals reviewed.   Lab Results  Component Value Date   WBC 13.0* 08/19/2015   HGB 13.7 08/19/2015   HCT 40.2 08/19/2015   PLT 239.0 08/19/2015   GLUCOSE 96 08/19/2015   CHOL 166 08/14/2015   TRIG 69.0 08/14/2015   HDL 54.90 08/14/2015   LDLCALC 97 08/14/2015   ALT 13 08/14/2015   AST 14 08/14/2015   NA 139 08/19/2015   K 4.5 08/19/2015   CL 108 08/19/2015   CREATININE 0.77 08/19/2015   BUN 13 08/19/2015   CO2 27 08/19/2015   TSH 1.30 08/14/2015   HGBA1C 5.5 08/29/2014    Dg Tibia/fibula Left  10/29/2014  CLINICAL DATA:  Injury.  Posttraumatic wound infection. EXAM: LEFT TIBIA AND FIBULA - 2 VIEW COMPARISON:  None. FINDINGS: No acute soft tissue or bony abnormality identified. No radiopaque foreign bodies. IMPRESSION: No acute abnormality. Electronically Signed   By: Christina [name]  Register   On: 10/29/2014 08:11    Assessment & Plan:   Christina [name] was seen today for leg pain.  Diagnoses and all orders for this visit:  Myalgia and myositis- Her exam is within normal limits, her CK level is within the normal range, renal function/electrolytes/blood counts are  all within normal limits. I think she is experiencing overuse syndrome in her lower extremities and will treat with Toradol injection. She was advised to stay well hydrated and to rest as much as possible. -     Basic metabolic panel; Future -     CBC with Differential/Platelet; Future -     Urinalysis, Routine w reflex microscopic (not at [facility]); Future -     CK; Future -     ketorolac (TORADOL) injection 60 mg; Inject 2 mLs (60 mg total) into the muscle once.   I am having Ms. Davidson maintain her multivitamin, etonogestrel-ethinyl estradiol, vitamin B-12, Omega-3 Fatty Acids (OMEGA 3 PO), Vortioxetine HBr, Diclofenac Sodium, meloxicam, phentermine, and cyclobenzaprine. We administered ketorolac.  Meds ordered this encounter  Medications  . cyclobenzaprine (FLEXERIL) 10 MG tablet    Sig:     Refill:  0  .  ketorolac (TORADOL) injection 60 mg    Sig:      Follow-up: No Follow-up on file.  Sanda Linger, MD

## 2015-08-19 NOTE — Progress Notes (Signed)
Pre visit review using our clinic review tool, if applicable. No additional management support is needed unless otherwise documented below in the visit note. 

## 2015-08-19 NOTE — Patient Instructions (Signed)
Myofascial Pain Syndrome and Fibromyalgia  Myofascial pain syndrome and fibromyalgia are both pain disorders. This pain may be felt mainly in your muscles.   · Myofascial pain syndrome:    Always has trigger points or tender points in the muscle that will cause pain when pressed. The pain may come and go.    Usually affects your neck, upper back, and shoulder areas. The pain often radiates into your arms and hands.  · Fibromyalgia:    Has muscle pains and tenderness that come and go.    Is often associated with fatigue and sleep disturbances.    Has trigger points.    Tends to be long-lasting (chronic), but is not life-threatening.  Fibromyalgia and myofascial pain are not the same. However, they often occur together. If you have both conditions, each can make the other worse. Both are common and can cause enough pain and fatigue to make day-to-day activities difficult.   CAUSES   The exact causes of fibromyalgia and myofascial pain are not known. People with certain gene types may be more likely to develop fibromyalgia. Some factors can be triggers for both conditions, such as:   · Spine disorders.  · Arthritis.  · Severe injury (trauma) and other physical stressors.  · Being under a lot of stress.  · A medical illness.  SIGNS AND SYMPTOMS   Fibromyalgia  The main symptom of fibromyalgia is widespread pain and tenderness in your muscles. This can vary over time. Pain is sometimes described as stabbing, shooting, or burning. You may have tingling or numbness, too. You may also have sleep problems and fatigue. You may wake up feeling tired and groggy (fibro fog). Other symptoms may include:   · Bowel and bladder problems.  · Headaches.  · Visual problems.  · Problems with odors and noises.  · Depression or mood changes.  · Painful menstrual periods (dysmenorrhea).  · Dry skin or eyes.  Myofascial pain syndrome  Symptoms of myofascial pain syndrome include:   · Tight, ropy bands of muscle.    · Uncomfortable  sensations in muscular areas, such as:    Aching.    Cramping.    Burning.    Numbness.    Tingling.      Muscle weakness.  · Trouble moving certain muscles freely (range of motion).  DIAGNOSIS   There are no specific tests to diagnose fibromyalgia or myofascial pain syndrome. Both can be hard to diagnose because their symptoms are common in many other conditions. Your health care provider may suspect one or both of these conditions based on your symptoms and medical history. Your health care provider will also do a physical exam.   The key to diagnosing fibromyalgia is having pain, fatigue, and other symptoms for more than three months that cannot be explained by another condition.   The key to diagnosing myofascial pain syndrome is finding trigger points in muscles that are tender and cause pain elsewhere in your body (referred pain).  TREATMENT   Treating fibromyalgia and myofascial pain often requires a team of health care providers. This usually starts with your primary provider and a physical therapist. You may also find it helpful to work with alternative health care providers, such as massage therapists or acupuncturists.  Treatment for fibromyalgia may include medicines. This may include nonsteroidal anti-inflammatory drugs (NSAIDs), along with other medicines.   Treatment for myofascial pain may also include:  · NSAIDs.  · Cooling and stretching of muscles.  · Trigger point injections.  ·   Sound wave (ultrasound) treatments to stimulate muscles.  HOME CARE INSTRUCTIONS   · Take medicines only as directed by your health care provider.  · Exercise as directed by your health care provider or physical therapist.  · Try to avoid stressful situations.  · Practice relaxation techniques to control your stress. You may want to try:    Biofeedback.    Visual imagery.    Hypnosis.    Muscle relaxation.    Yoga.    Meditation.  · Talk to your health care provider about alternative treatments, such as acupuncture or  massage treatment.  · Maintain a healthy lifestyle. This includes eating a healthy diet and getting enough sleep.  · Consider joining a support group.  · Do not do activities that stress or strain your muscles. That includes repetitive motions and heavy lifting.  SEEK MEDICAL CARE IF:   · You have new symptoms.  · Your symptoms get worse.  · You have side effects from your medicines.  · You have trouble sleeping.  · Your condition is causing depression or anxiety.  FOR MORE INFORMATION   · National Fibromyalgia Association: http://www.fmaware.orgwww.fmaware.org  · Arthritis Foundation: http://www.arthritis.orgwww.arthritis.org  · American Chronic Pain Association: http://www.theacpa.org/condition/myofascial-painwww.theacpa.org/condition/myofascial-pain     This information is not intended to replace advice given to you by your health care provider. Make sure you discuss any questions you have with your health care provider.     Document Released: 03/08/2005 Document Revised: 03/29/2014 Document Reviewed: 12/12/2013  Elsevier Interactive Patient Education ©2016 Elsevier Inc[employer]

## 2015-08-29 ENCOUNTER — Other Ambulatory Visit (HOSPITAL_COMMUNITY): Payer: Self-pay | Admitting: Obstetrics and Gynecology

## 2015-08-29 DIAGNOSIS — N971 Female infertility of tubal origin: Secondary | ICD-10-CM

## 2015-09-05 ENCOUNTER — Ambulatory Visit (HOSPITAL_COMMUNITY)
Admission: RE | Admit: 2015-09-05 | Discharge: 2015-09-05 | Disposition: A | Discharge status: Home / Self Care | Payer: 59 | Source: Ambulatory Visit | Attending: Obstetrics and Gynecology | Admitting: Obstetrics and Gynecology

## 2015-09-05 DIAGNOSIS — N971 Female infertility of tubal origin: Secondary | ICD-10-CM

## 2015-09-05 DIAGNOSIS — Z3049 Encounter for surveillance of other contraceptives: Secondary | ICD-10-CM | POA: Insufficient documentation

## 2015-09-05 MED ORDER — IOPAMIDOL (ISOVUE-300) INJECTION 61%
30.0000 mL | Freq: Once | INTRAVENOUS | Status: AC | PRN
  Administered 2015-09-05: 5 mL

## 2015-09-26 ENCOUNTER — Ambulatory Visit (INDEPENDENT_AMBULATORY_CARE_PROVIDER_SITE_OTHER): Payer: 59 | Admitting: Family Medicine

## 2015-09-26 ENCOUNTER — Encounter: Payer: Self-pay | Admitting: Family Medicine

## 2015-09-26 DIAGNOSIS — M9903 Segmental and somatic dysfunction of lumbar region: Secondary | ICD-10-CM | POA: Diagnosis not present

## 2015-09-26 DIAGNOSIS — S29019D Strain of muscle and tendon of unspecified wall of thorax, subsequent encounter: Secondary | ICD-10-CM

## 2015-09-26 DIAGNOSIS — E539 Vitamin B deficiency, unspecified: Secondary | ICD-10-CM | POA: Diagnosis not present

## 2015-09-26 DIAGNOSIS — M999 Biomechanical lesion, unspecified: Secondary | ICD-10-CM

## 2015-09-26 DIAGNOSIS — S29012D Strain of muscle and tendon of back wall of thorax, subsequent encounter: Secondary | ICD-10-CM

## 2015-09-26 DIAGNOSIS — M9901 Segmental and somatic dysfunction of cervical region: Secondary | ICD-10-CM | POA: Diagnosis not present

## 2015-09-26 DIAGNOSIS — M9902 Segmental and somatic dysfunction of thoracic region: Secondary | ICD-10-CM

## 2015-09-26 MED ORDER — CYANOCOBALAMIN 1000 MCG/ML IJ SOLN
1000.0000 ug | Freq: Once | INTRAMUSCULAR | Status: AC
  Administered 2015-09-26: 1000 ug via INTRAMUSCULAR

## 2015-09-26 NOTE — Assessment & Plan Note (Signed)
Seems to be doing better overall. Do think there is still some muscle allergies that is contribute into the discomfort. We discussed icing regimen. Discussed posture as well as ergonomics are up-to-date. Patient did respond well to osteopathic manipulation and will follow-up as needed.

## 2015-09-26 NOTE — Assessment & Plan Note (Signed)
Decision today to treat with OMT was based on Physical Exam  After verbal consent patient was treated with HVLA, ME, FPR techniques in thoracic, cervical, and lumbar areas  Patient tolerated the procedure well with improvement in symptoms  Patient given exercises, stretches and lifestyle modifications  See medications in patient instructions if given  Patient will follow up in 3-4 weeks

## 2015-09-26 NOTE — Progress Notes (Signed)
Christina [name] D.O. [facility] [address] [place], [place] [address] Phone: [phone] Subjective:    CC: Back pain follow-up  BJY:NWGNFAOZHYHPI:Subjective Christina GarbeStefannie Bronson CurbM [name] is a 35 y.o. female coming in with complaint of  midback pain. Patient did move him. Patient since she has moved states that the stress level has been significantly better. Patient states that also not doing a boxing and unpacking has helped. Patient states that started having increasing discomfort though. Seems to be more on the lower and mid back again. More of a tightness than anything else. Nothing that seems severe enough that stops her from any activities. Patient not having to take even the anti-inflammatory regularly.    Past Medical History  Diagnosis Date  . Ovarian cyst   . Postpartum hemorrhage   . Normal pregnancy 03/11/2012  . SVD (spontaneous vaginal delivery) 03/11/2012  . Migraine    No past surgical history on file. Social History   Social History  . Marital Status: Divorced    Spouse Name: N/A  . Number of Children: N/A  . Years of Education: N/A   Occupational History  . cma  [employer]   Social History Main Topics  . Smoking status: Current Every Day Smoker -- 0.50 packs/day for 20 years    Types: Cigarettes  . Smokeless tobacco: Not on file  . Alcohol Use: No  . Drug Use: No  . Sexual Activity:    Partners: Male   Other Topics Concern  . Not on file   Social History Narrative   Allergies  Allergen Reactions  . Hydrocodone Itching  . Prednisone Other (See Comments)    Hyper, agitated, aggressive   Family History  Problem Relation Age of Onset  . Arthritis Father   . Sudden death Father [id number]    Sinus Cancer  . Cancer Father     nasal cancer  . Arthritis Maternal Grandfather   . Hyperlipidemia Maternal Grandfather   . Heart disease Maternal Grandfather   . Breast cancer Maternal Grandmother   . Arthritis Maternal Grandmother   . Lung cancer Maternal Grandmother    . Heart disease Maternal Grandmother   . Hyperlipidemia Paternal Grandmother   . Stroke Paternal Grandfather   . Bipolar disorder Mother   . Sudden death Brother     GSW    Past medical history, social, surgical and family history all reviewed in electronic medical record.  No pertanent information unless stated regarding to the chief complaint.   Review of Systems: No headache, visual changes, nausea, vomiting, diarrhea, constipation, dizziness, abdominal pain, skin rash, fevers, chills, night sweats, weight loss, swollen lymph nodes, body aches, joint swelling, muscle aches, chest pain, shortness of breath, mood changes.   Objective Last menstrual period 08/29/2015.  General: No apparent distress alert and oriented x3 mood and affect normal, dressed appropriately.  HEENT: Pupils equal, extraocular movements intact  Respiratory: Patient's speak in full sentences and does not appear short of breath  Cardiovascular: No lower extremity edema, non tender, no erythema  Skin: Warm dry intact with no signs of infection or rash on extremities or on axial skeleton.  Abdomen: Soft nontender  Neuro: Cranial nerves II through XII are intact, neurovascularly intact in all extremities with 2+ DTRs and 2+ pulses.  Lymph: No lymphadenopathy of posterior or anterior cervical chain or axillae bilaterally.  Gait normal with good balance and coordination.  MSK:  Non tender with full range of motion and good stability and symmetric strength and  tone of  elbows, wrist, hip, knee and ankles bilaterally.  Neck: Inspection unremarkable. No palpable stepoffs. Negative Spurling's maneuver. Full neck range of motionmild tightness noted that is more than previous exam Grip strength and sensation normal in bilateral hands Strength good C4 to T1 distribution No sensory change to C4 to T1 Negative Hoffman sign bilaterally Reflexes normal  Back Exam:  Inspection: Unremarkable  Motion: Flexion 45 deg,  Extension 45 deg, Side Bending to 45 deg bilaterally,  Rotation to 45 deg bilaterally  SLR laying: Negative  XSLR laying: Negative  Palpable tenderness: continue mild tightness of the thoracolumbar juncture bilaterally no trigger points noted in the trapezius. FABER: negative. Sensory change: Gross sensation intact to all lumbar and sacral dermatomes.  Reflexes: 2+ at both patellar tendons, 2+ at achilles tendons, Babinski's downgoing.  Strength at foot  Plantar-flexion: 5/5 Dorsi-flexion: 5/5 Eversion: 5/5 Inversion: 5/5  Leg strength  Quad: 5/5 Hamstring: 5/5 Hip flexor: 5/5 Hip abductors: 4/5  Gait unremarkable.  Shoulder: Right Inspection reveals no abnormalities, atrophy or asymmetry. Palpation is normal with no tenderness over AC joint or bicipital groove. ROM is full in all planes. Rotator cuff strength normal throughout. No signs of impingement with negative Neer and Hawkin's tests, empty can sign. Speeds and Yergason's tests normal. No labral pathology noted with negative Obrien's, negative clunk and good stability. Normal scapular function observed. No painful arc and no drop arm sign. No apprehension sign shoulder unremarkable   Osteopathic findings C2 flexed rotated and side bent right T3 extended rotated and side bent left  T5 extended rotated and side bent right with inhaled fifth rib L2 flexed rotated and side bent right     Impression and Recommendations:     This case required medical decision making of moderate complexity.      Note: This dictation was prepared with Dragon dictation along with smaller phrase technology. Any transcriptional errors that result from this process are unintentional.

## 2015-11-04 ENCOUNTER — Telehealth: Payer: Self-pay | Admitting: *Deleted

## 2015-11-04 NOTE — Telephone Encounter (Signed)
Pt c/o left hand bee sting by 2 yellow jackets last night while doing yard work. She is requesting depo medrol injection for swelling, pain and itching. Please advise.

## 2015-11-04 NOTE — Telephone Encounter (Signed)
Depo medrol 120 mg given.

## 2015-11-04 NOTE — Telephone Encounter (Signed)
Yes Depo-medrol 120 mg IM

## 2015-11-14 ENCOUNTER — Other Ambulatory Visit (INDEPENDENT_AMBULATORY_CARE_PROVIDER_SITE_OTHER): Payer: 59

## 2015-11-14 ENCOUNTER — Telehealth: Payer: Self-pay | Admitting: *Deleted

## 2015-11-14 DIAGNOSIS — R5383 Other fatigue: Secondary | ICD-10-CM

## 2015-11-14 LAB — CBC WITH DIFFERENTIAL/PLATELET
BASOS ABS: 0 10*3/uL (ref 0.0–0.1)
Basophils Relative: 0.4 % (ref 0.0–3.0)
EOS ABS: 0.2 10*3/uL (ref 0.0–0.7)
Eosinophils Relative: 1.5 % (ref 0.0–5.0)
HEMATOCRIT: 39.9 % (ref 36.0–46.0)
HEMOGLOBIN: 13.7 g/dL (ref 12.0–15.0)
LYMPHS PCT: 24.6 % (ref 12.0–46.0)
Lymphs Abs: 2.6 10*3/uL (ref 0.7–4.0)
MCHC: 34.3 g/dL (ref 30.0–36.0)
MCV: 89.1 fl (ref 78.0–100.0)
MONOS PCT: 4.6 % (ref 3.0–12.0)
Monocytes Absolute: 0.5 10*3/uL (ref 0.1–1.0)
Neutro Abs: 7.3 10*3/uL (ref 1.4–7.7)
Neutrophils Relative %: 68.9 % (ref 43.0–77.0)
Platelets: 243 10*3/uL (ref 150.0–400.0)
RBC: 4.47 Mil/uL (ref 3.87–5.11)
RDW: 13.3 % (ref 11.5–15.5)
WBC: 10.6 10*3/uL — AB (ref 4.0–10.5)

## 2015-11-14 NOTE — Telephone Encounter (Signed)
Pt c/o extreme fatigue x 1 day. PCP is out of office today. Per Dr. Posey ReaPlotnikov CBC w/diff ordered.

## 2015-12-03 NOTE — Progress Notes (Signed)
Tawana ScaleZach Smith D.O. [facility] [address] [place], [place] [address] Phone: [phone] Subjective:    CC: Back pain follow-up  OZH:[id number]:Subjective  Christina GarbeStefannie Bronson CurbM [name] is a 35 y.o. female coming in with complaint of  Lower back pain. Patient has a pain is on throbbing sensation. Does not remember any true injury. Rates the severity of pain a 7 out of 10. Patient states that seems to stay localized in the lower back. Feels very similar to when she was pregnant 3 years ago she states. Denies that she is pregnant at this time. Patient states that any type of flexion seems to give her more pain. No pain with extension.    Past Medical History:  Diagnosis Date  . Migraine   . Normal pregnancy 03/11/2012  . Ovarian cyst   . Postpartum hemorrhage   . SVD (spontaneous vaginal delivery) 03/11/2012   No past surgical history on file. Social History   Social History  . Marital status: Divorced    Spouse name: N/A  . Number of children: N/A  . Years of education: N/A   Occupational History  . cma     Social History Main Topics  . Smoking status: Current Every Day Smoker    Packs/day: 0.50    Years: 20.00    Types: Cigarettes  . Smokeless tobacco: Not on file  . Alcohol use No  . Drug use: No  . Sexual activity: Yes    Partners: Male   Other Topics Concern  . Not on file   Social History Narrative  . No narrative on file   Allergies  Allergen Reactions  . Hydrocodone Itching  . Prednisone Other (See Comments)    Hyper, agitated, aggressive   Family History  Problem Relation Age of Onset  . Arthritis Father   . Sudden death Father [id number]    Sinus Cancer  . Cancer Father     nasal cancer  . Arthritis Maternal Grandfather   . Hyperlipidemia Maternal Grandfather   . Heart disease Maternal Grandfather   . Breast cancer Maternal Grandmother   . Arthritis Maternal Grandmother   . Lung cancer Maternal Grandmother   . Heart disease Maternal Grandmother     . Hyperlipidemia Paternal Grandmother   . Stroke Paternal Grandfather   . Bipolar disorder Mother   . Sudden death Brother     GSW    Past medical history, social, surgical and family history all reviewed in electronic medical record.  No pertanent information unless stated regarding to the chief complaint.   Review of Systems: No headache, visual changes, nausea, vomiting, diarrhea, constipation, dizziness, abdominal pain, skin rash, fevers, chills, night sweats, weight loss, swollen lymph nodes, body aches, joint swelling, muscle aches, chest pain, shortness of breath, mood changes.   Objective  There were no vitals taken for this visit.  General: No apparent distress alert and oriented x3 mood and affect normal, dressed appropriately.  HEENT: Pupils equal, extraocular movements intact  Respiratory: Patient's speak in full sentences and does not appear short of breath  Cardiovascular: No lower extremity edema, non tender, no erythema  Skin: Warm dry intact with no signs of infection or rash on extremities or on axial skeleton.  Abdomen: Soft nontender  Neuro: Cranial nerves II through XII are intact, neurovascularly intact in all extremities with 2+ DTRs and 2+ pulses.  Lymph: No lymphadenopathy of posterior or anterior cervical chain or axillae bilaterally.  Gait normal with good balance and coordination.  MSK:  Non tender with full range of motion and good stability and symmetric strength and tone of  elbows, wrist, hip, knee and ankles bilaterally. gn bilaterally Reflexes normal  Back Exam:  Inspection: Unremarkable  Motion: Flexion 25 deg, Extension 15 deg, Side Bending to 35 deg bilaterally,  Rotation to 35 deg bilaterally  SLR laying: Negative  XSLR laying: Negative  Palpable tenderness: Increasing tenderness over the right sacroiliac joint FABER: Positive right. Sensory change: Gross sensation intact to all lumbar and sacral dermatomes.  Reflexes: 2+ at both patellar  tendons, 2+ at achilles tendons, Babinski's downgoing.  Strength at foot  Plantar-flexion: 5/5 Dorsi-flexion: 5/5 Eversion: 5/5 Inversion: 5/5  Leg strength  Quad: 5/5 Hamstring: 5/5 Hip flexor: 5/5 Hip abductors: 4/5  Gait unremarkable.   Osteopathic findings C2 flexed rotated and side bent right T3 extended rotated and side bent left  T5 extended rotated and side bent right with inhaled fifth rib T7 extended rotated and side bent right L2 flexed rotated and side bent right sacrum right on right   Impression and Recommendations:     This case required medical decision making of moderate complexity.      Note: This dictation was prepared with Dragon dictation along with smaller phrase technology. Any transcriptional errors that result from this process are unintentional.

## 2015-12-04 ENCOUNTER — Ambulatory Visit (INDEPENDENT_AMBULATORY_CARE_PROVIDER_SITE_OTHER): Payer: 59 | Admitting: Family Medicine

## 2015-12-04 DIAGNOSIS — M9902 Segmental and somatic dysfunction of thoracic region: Secondary | ICD-10-CM | POA: Diagnosis not present

## 2015-12-04 DIAGNOSIS — M9904 Segmental and somatic dysfunction of sacral region: Secondary | ICD-10-CM | POA: Diagnosis not present

## 2015-12-04 DIAGNOSIS — M9903 Segmental and somatic dysfunction of lumbar region: Secondary | ICD-10-CM | POA: Diagnosis not present

## 2015-12-04 DIAGNOSIS — M999 Biomechanical lesion, unspecified: Secondary | ICD-10-CM

## 2015-12-04 DIAGNOSIS — M533 Sacrococcygeal disorders, not elsewhere classified: Secondary | ICD-10-CM

## 2015-12-04 NOTE — Assessment & Plan Note (Signed)
Sacroiliac Joint Mobilization and Rehab 1. Work on pretzel stretching, shoulder back and leg draped in front. 3-5 sets, 30 sec.. 2. hip abductor rotations. standing, hip flexion and rotation outward then inward. 3 sets, 15 reps. when can do comfortably, add ankle weights starting at 2 pounds.  3. cross over stretching - shoulder back to ground, same side leg crossover. 3-5 sets for 30 min..  4. rolling up and back knees to chest and rocking. 5. sacral tilt - 5 sets, hold for 5-10 seconds Discussed HEP and icing, pennsaid given.  RTC in 2 weeks.

## 2015-12-04 NOTE — Patient Instructions (Signed)
Good to see you  Ice is your friend Meloxicam daily through the weekend.  Stay active but maybe not soccer today  See me again when you need me.

## 2015-12-04 NOTE — Assessment & Plan Note (Signed)
Decision today to treat with OMT was based on Physical Exam  After verbal consent patient was treated with HVLA, ME, FPR techniques in thoracic, cervical, and lumbar and sacral areas  Patient tolerated the procedure well with improvement in symptoms  Patient given exercises, stretches and lifestyle modifications  See medications in patient instructions if given  Patient will follow up in 3-4 weeks      

## 2015-12-22 ENCOUNTER — Other Ambulatory Visit: Payer: Self-pay | Admitting: *Deleted

## 2015-12-22 ENCOUNTER — Ambulatory Visit (INDEPENDENT_AMBULATORY_CARE_PROVIDER_SITE_OTHER)
Admission: RE | Admit: 2015-12-22 | Discharge: 2015-12-22 | Disposition: A | Discharge status: Home / Self Care | Payer: 59 | Source: Ambulatory Visit | Attending: Family Medicine | Admitting: Family Medicine

## 2015-12-22 DIAGNOSIS — M5440 Lumbago with sciatica, unspecified side: Secondary | ICD-10-CM | POA: Diagnosis not present

## 2015-12-22 DIAGNOSIS — M545 Low back pain: Secondary | ICD-10-CM | POA: Diagnosis not present

## 2015-12-23 NOTE — Progress Notes (Signed)
[name] [address] [place], [place] [address] Phone: [phone] Subjective:    CC: Back pain follow-up  [id number]  Christina [name] is a 35 y.o. female coming in with complaint of  Lower back pain. Patient had been doing very well but unfortunately is having worsening back pain again. An states that it seems to be over the sacroiliac joint. States that certain movements such as bending or squatting down seems to be severely painful. Can't find a comfortable position at night. Denies any radiation down the legs any numbness or tingling.     Past Medical History:  Diagnosis Date  . Migraine   . Normal pregnancy 03/11/2012  . Ovarian cyst   . Postpartum hemorrhage   . SVD (spontaneous vaginal delivery) 03/11/2012   No past surgical history on file. Social History   Social History  . Marital status: Divorced    Spouse name: N/A  . Number of children: N/A  . Years of education: N/A   Occupational History  . cma  [employer]   Social History Main Topics  . Smoking status: Current Every Day Smoker    Packs/day: 0.50    Years: 20.00    Types: Cigarettes  . Smokeless tobacco: None  . Alcohol use No  . Drug use: No  . Sexual activity: Yes    Partners: Male   Other Topics Concern  . None   Social History Narrative  . None   Allergies  Allergen Reactions  . Hydrocodone Itching  . Prednisone Other (See Comments)    Hyper, agitated, aggressive   Family History  Problem Relation Age of Onset  . Arthritis Father   . Sudden death Father 92    Sinus Cancer  . Cancer Father     nasal cancer  . Arthritis Maternal Grandfather   . Hyperlipidemia Maternal Grandfather   . Heart disease Maternal Grandfather   . Breast cancer Maternal Grandmother   . Arthritis Maternal Grandmother   . Lung cancer Maternal Grandmother   . Heart disease Maternal Grandmother   . Hyperlipidemia Paternal Grandmother   . Stroke Paternal Grandfather    . Bipolar disorder Mother   . Sudden death Brother     GSW    Past medical history, social, surgical and family history all reviewed in electronic medical record.  No pertanent information unless stated regarding to the chief complaint.   Review of Systems: No headache, visual changes, nausea, vomiting, diarrhea, constipation, dizziness, abdominal pain, skin rash, fevers, chills, night sweats, weight loss, swollen lymph nodes, body aches, joint swelling, muscle aches, chest pain, shortness of breath, mood changes.   Objective  Last menstrual period 11/30/2015.  General: No apparent distress alert and oriented x3 mood and affect normal, dressed appropriately.  HEENT: Pupils equal, extraocular movements intact  Respiratory: Patient's speak in full sentences and does not appear short of breath  Cardiovascular: No lower extremity edema, non tender, no erythema  Skin: Warm dry intact with no signs of infection or rash on extremities or on axial skeleton.  Abdomen: Soft nontender  Neuro: Cranial nerves II through XII are intact, neurovascularly intact in all extremities with 2+ DTRs and 2+ pulses.  Lymph: No lymphadenopathy of posterior or anterior cervical chain or axillae bilaterally.  Gait Mild antalgic gait.  MSK:  Non tender with full range of motion and good stability and symmetric strength and tone of  elbows, wrist, hip, knee and ankles bilaterally. gn bilaterally Reflexes normal  Back Exam:  Inspection: Unremarkable  Motion: Flexion 25 deg, Extension 15 deg, Side Bending to 35 deg bilaterally,  Rotation to 35 deg bilaterally  SLR laying: Negative  XSLR laying: Negative  Palpable tenderness:Significant increase in pain over the sacroiliac joint on the right side FABER: Positive right. Sensory change: Gross sensation intact to all lumbar and sacral dermatomes.  Reflexes: 2+ at both patellar tendons, 2+ at achilles tendons, Babinski's downgoing.  Strength at foot  Plantar-flexion:  5/5 Dorsi-flexion: 5/5 Eversion: 5/5 Inversion: 5/5  Leg strength  Quad: 5/5 Hamstring: 5/5 Hip flexor: 5/5 Hip abductors: 4+/5  Gait unremarkable.   Osteopathic findings C2 flexed rotated and side bent right T5 extended rotated and side bent left  T8 extended rotated and side bent right with inhaled fifth ribt L2 flexed rotated and side bent right sacrum right on right  sacrum right on right Impression and Recommendations:     This case required medical decision making of moderate complexity.      Note: This dictation was prepared with Dragon dictation along with smaller phrase technology. Any transcriptional errors that result from this process are unintentional.

## 2015-12-24 ENCOUNTER — Encounter: Payer: Self-pay | Admitting: Family Medicine

## 2015-12-24 ENCOUNTER — Ambulatory Visit (INDEPENDENT_AMBULATORY_CARE_PROVIDER_SITE_OTHER): Payer: 59 | Admitting: Family Medicine

## 2015-12-24 DIAGNOSIS — M999 Biomechanical lesion, unspecified: Secondary | ICD-10-CM

## 2015-12-24 DIAGNOSIS — M533 Sacrococcygeal disorders, not elsewhere classified: Secondary | ICD-10-CM

## 2015-12-24 NOTE — Patient Instructions (Addendum)
Good to see you  Sent in my chart

## 2015-12-24 NOTE — Assessment & Plan Note (Signed)
Decision today to treat with OMT was based on Physical Exam  After verbal consent patient was treated with HVLA, ME, FPR techniques in thoracic, cervical, and lumbar and sacral areas  Patient tolerated the procedure well with improvement in symptoms  Patient given exercises, stretches and lifestyle modifications  See medications in patient instructions if given  Patient will follow up in 4 weeks

## 2015-12-24 NOTE — Assessment & Plan Note (Signed)
Patient continues to have some dysfunction overall. We discussed icing regimen and home exercises. I do believe that there is some second somatic pathology that could be contribute in. Patient is on an antidepressant medication at this moment. We will see how patient continues to respond that we'll consider adding an adjunct therapy if necessary. Has meloxicam for breakthrough pain. Follow-up again in 4 weeks for further evaluation and treatment.

## 2015-12-29 ENCOUNTER — Ambulatory Visit (INDEPENDENT_AMBULATORY_CARE_PROVIDER_SITE_OTHER): Payer: 59 | Admitting: Internal Medicine

## 2015-12-29 ENCOUNTER — Encounter: Payer: Self-pay | Admitting: Internal Medicine

## 2015-12-29 VITALS — BP 112/78 | HR 83 | Temp 98.4°F | Resp 16 | Wt 181.0 lb

## 2015-12-29 DIAGNOSIS — F329 Major depressive disorder, single episode, unspecified: Secondary | ICD-10-CM | POA: Diagnosis not present

## 2015-12-29 DIAGNOSIS — F450 Somatization disorder: Secondary | ICD-10-CM | POA: Diagnosis not present

## 2015-12-29 DIAGNOSIS — F17200 Nicotine dependence, unspecified, uncomplicated: Secondary | ICD-10-CM | POA: Diagnosis not present

## 2015-12-29 DIAGNOSIS — F32A Depression, unspecified: Secondary | ICD-10-CM

## 2015-12-29 MED ORDER — VARENICLINE TARTRATE 0.5 MG X 11 & 1 MG X 42 PO MISC: ORAL | 0 refills | Status: DC

## 2015-12-29 MED ORDER — VARENICLINE TARTRATE 1 MG PO TABS: 1.0000 mg | ORAL_TABLET | Freq: Two times a day (BID) | ORAL | 3 refills | Status: DC

## 2015-12-29 MED ORDER — BUPROPION HCL ER (XL) 150 MG PO TB24: 150.0000 mg | ORAL_TABLET | Freq: Every day | ORAL | 1 refills | Status: DC

## 2015-12-29 NOTE — Patient Instructions (Signed)
Tobacco Use Disorder Tobacco use disorder (TUD) is a mental disorder. It is the long-term use of tobacco in spite of related health problems or difficulty with normal life activities. Tobacco is most commonly smoked as cigarettes and less commonly as cigars or pipes. Smokeless chewing tobacco and snuff are also popular. People with TUD get a feeling of extreme pleasure (euphoria) from using tobacco and have a desire to use it again and again. Repeated use of tobacco can cause problems. The addictive effects of tobacco are due mainly tothe ingredient nicotine. Nicotine also causes a rush of adrenaline (epinephrine) in the body. This leads to increased blood pressure, heart rate, and breathing rate. These changes may cause problems for people with high blood pressure, weak hearts, or lung disease. High doses of nicotine in children and pets can lead to seizures and death.  Tobacco contains a number of other unsafe chemicals. These chemicals are especially harmful when inhaled as smoke and can damage almost every organ in the body. Smokers live shorter lives than nonsmokers and are at risk of dying from a number of diseases and cancers. Tobacco smoke can also cause health problems for nonsmokers (due to inhaling secondhand smoke). Smoking is also a fire hazard.  TUD usually starts in the late teenage years and is most common in young adults between the ages of 18 and 25 years. People who start smoking earlier in life are more likely to continue smoking as adults. TUD is somewhat more common in men than women. People with TUD are at higher risk for using alcohol and other drugs of abuse. RISK FACTORS Risk factors for TUD include:   Having family members with the disorder.  Being around people who use tobacco.  Having an existing mental health issue such as schizophrenia, depression, bipolar disorder, ADHD, or posttraumatic stress disorder (PTSD). SIGNS AND SYMPTOMS  People with tobacco use disorder have  two or more of the following signs and symptoms within 12 months:   Use of more tobacco over a longer period than intended.   Not able to cut down or control tobacco use.   A lot of time spent obtaining or using tobacco.   Strong desire or urge to use tobacco (craving). Cravings may last for 6 months or longer after quitting.  Use of tobacco even when use leads to major problems at work, school, or home.   Use of tobacco even when use leads to relationship problems.   Giving up or cutting down on important life activities because of tobacco use.   Repeatedly using tobacco in situations where it puts you or others in physical danger, like smoking in bed.   Use of tobacco even when it is known that a physical or mental problem is likely related to tobacco use.   Physical problems are numerous and may include chronic bronchitis, emphysema, lung and other cancers, gum disease, high blood pressure, heart disease, and stroke.   Mental problems caused by tobacco may include difficulty sleeping and anxiety.  Need to use greater amounts of tobacco to get the same effect. This means you have developed a tolerance.   Withdrawal symptoms as a result of stopping or rapidly cutting back use. These symptoms may last a month or more after quitting and include the following:   Depressed, anxious, or irritable mood.   Difficulty concentrating.   Increased appetite.  Restlessness or trouble sleeping.   Use of tobacco to avoid withdrawal symptoms. DIAGNOSIS  Tobacco use disorder is diagnosed by   your health care provider. A diagnosis may be made by:  Your health care provider asking questions about your tobacco use and any problems it may be causing.  A physical exam.  Lab tests.  You may be referred to a mental health professional or addiction specialist. The severity of tobacco use disorder depends on the number of signs and symptoms you have:   Mild--Two or three  symptoms.  Moderate--Four or five symptoms.   Severe--Six or more symptoms.  TREATMENT  Many people with tobacco use disorder are unable to quit on their own and need help. Treatment options include the following:  Nicotine replacement therapy (NRT). NRT provides nicotine without the other harmful chemicals in tobacco. NRT gradually lowers the dosage of nicotine in the body and reduces withdrawal symptoms. NRT is available in over-the-counter forms (gum, lozenges, and skin patches) as well as prescription forms (mouth inhaler and nasal spray).  Medicines.This may include:  Antidepressant medicine that may reduce nicotine cravings.  A medicine that acts on nicotine receptors in the brain to reduce cravings and withdrawal symptoms. It may also block the effects of tobacco in people with TUD who relapse.  Counseling or talk therapy. A form of talk therapy called behavioral therapy is commonly used to treat people with TUD. Behavioral therapy looks at triggers for tobacco use, how to avoid them, and how to cope with cravings. It is most effective in person or by phone but is also available in self-help forms (books and Internet websites).  Support groups. These provide emotional support, advice, and guidance for quitting tobacco. The most effective treatment for TUD is usually a combination of medicine, talk therapy, and support groups. HOME CARE INSTRUCTIONS  Keep all follow-up visits as directed by your health care provider. This is important.  Take medicines only as directed by your health care provider.  Check with your health care provider before starting new prescription or over-the-counter medicines. SEEK MEDICAL CARE IF:  You are not able to take your medicines as prescribed.  Treatment is not helping your TUD and your symptoms get worse. SEEK IMMEDIATE MEDICAL CARE IF:  You have serious thoughts about hurting yourself or others.  You have trouble breathing, chest pain,  sudden weakness, or sudden numbness in part of your body.   This information is not intended to replace advice given to you by your health care provider. Make sure you discuss any questions you have with your health care provider.   Document Released: 11/12/2003 Document Revised: 03/29/2014 Document Reviewed: 05/04/2013 [employer] Interactive Patient Education 2016 [employer]  

## 2015-12-29 NOTE — Progress Notes (Signed)
Pre visit review using our clinic review tool, if applicable. No additional management support is needed unless otherwise documented below in the visit note. 

## 2015-12-29 NOTE — Progress Notes (Signed)
Subjective:  Patient ID: Christina [name], female    DOB: Dec 25, 1980  Age: 35 y.o. MRN: [id number]  CC: Depression   HPI Christina [name] presents for follow-up on depression. She complains of persistent episodes of fatigue, anhedonia, crying spells, irritability. She has noticed some improvement with Trintellix. She denies SI or HI.  She wants to try to quit smoking and is willing to start taking Chantix.  Outpatient Medications Prior to Visit  Medication Sig Dispense Refill  . cyclobenzaprine (FLEXERIL) 10 MG tablet   0  . Diclofenac Sodium (PENNSAID) 2 % SOLN Place 2 application onto the skin 2 (two) times daily. 112 g 3  . meloxicam (MOBIC) 15 MG tablet Take 1 tablet (15 mg total) by mouth daily. 90 tablet 1  . Multiple Vitamin (MULTIVITAMIN) tablet Take 1 tablet by mouth daily.    . phentermine 30 MG capsule Take 1 capsule (30 mg total) by mouth every morning. Reported on 04/24/2015 30 capsule 2  . Vortioxetine HBr (TRINTELLIX) 20 MG TABS Take 20 mg by mouth daily. 30 tablet 11  . etonogestrel-ethinyl estradiol (NUVARING) 0.12-0.015 MG/24HR vaginal ring Place 1 each vaginally every 28 (twenty-eight) days. Insert vaginally and leave in place for 3 consecutive weeks, then remove for 1 week.    . Omega-3 Fatty Acids (OMEGA 3 PO) Take by mouth.    . vitamin B-12 (CYANOCOBALAMIN) 1000 MCG tablet Take 1,000 mcg by mouth daily.     No facility-administered medications prior to visit.     ROS Review of Systems  Constitutional: Negative.  Negative for appetite change, chills, diaphoresis, fatigue and fever.  HENT: Negative.   Eyes: Negative.  Negative for visual disturbance.  Respiratory: Negative.  Negative for cough, choking, chest tightness, shortness of breath and stridor.   Cardiovascular: Negative for chest pain, palpitations and leg swelling.  Gastrointestinal: Negative.  Negative for abdominal pain, constipation, diarrhea, nausea and vomiting.  Endocrine: Negative.     Genitourinary: Negative.  Negative for difficulty urinating, dysuria, frequency and hematuria.  Musculoskeletal: Positive for back pain. Negative for joint swelling, myalgias and neck pain.  Skin: Negative.  Negative for color change, pallor and rash.  Allergic/Immunologic: Negative.   Neurological: Negative.  Negative for dizziness, weakness, light-headedness and headaches.  Hematological: Negative.  Negative for adenopathy. Does not bruise/bleed easily.  Psychiatric/Behavioral: Positive for dysphoric mood. Negative for confusion, decreased concentration, self-injury, sleep disturbance and suicidal ideas. The patient is not nervous/anxious.     Objective:  BP 112/78   Pulse 83   Temp 98.4 F (36.9 C) (Oral)   Resp 16   Wt 181 lb (82.1 kg)   LMP 11/30/2015   SpO2 97%   BMI 30.12 kg/m   BP Readings from Last 3 Encounters:  12/29/15 112/78  08/19/15 110/90  08/14/15 122/84    Wt Readings from Last 3 Encounters:  12/29/15 181 lb (82.1 kg)  08/14/15 183 lb (83 kg)  04/24/15 189 lb (85.7 kg)    Physical Exam  Constitutional: She is oriented to person, place, and time. No distress.  HENT:  Mouth/Throat: Oropharynx is clear and moist. No oropharyngeal exudate.  Eyes: Conjunctivae are normal. Right eye exhibits no discharge. Left eye exhibits no discharge. No scleral icterus.  Neck: Normal range of motion. Neck supple. No JVD present. No tracheal deviation present. No thyromegaly present.  Cardiovascular: Normal rate, regular rhythm, normal heart sounds and intact distal pulses.  Exam reveals no gallop and no friction rub.   No murmur heard.  Pulmonary/Chest: Effort normal and breath sounds normal. No stridor. No respiratory distress. She has no wheezes. She has no rales. She exhibits no tenderness.  Abdominal: Soft. Bowel sounds are normal. She exhibits no distension and no mass. There is no tenderness. There is no rebound and no guarding.  Musculoskeletal: Normal range of  motion. She exhibits no edema, tenderness or deformity.  Lymphadenopathy:    She has no cervical adenopathy.  Neurological: She is oriented to person, place, and time.  Skin: Skin is warm and dry. No rash noted. She is not diaphoretic. No erythema. No pallor.  Psychiatric: Her speech is normal and behavior is normal. Judgment and thought content normal. Her mood appears anxious. Her affect is not angry, not blunt, not labile and not inappropriate. She is not agitated, not aggressive, not slowed, not withdrawn and not combative. Cognition and memory are normal. She exhibits a depressed mood. She expresses no homicidal and no suicidal ideation. She expresses no suicidal plans and no homicidal plans.  She is tearful She is attentive.  Vitals reviewed.   Lab Results  Component Value Date   WBC 10.6 (H) 11/14/2015   HGB 13.7 11/14/2015   HCT 39.9 11/14/2015   PLT 243.0 11/14/2015   GLUCOSE 96 08/19/2015   CHOL 166 08/14/2015   TRIG 69.0 08/14/2015   HDL 54.90 08/14/2015   LDLCALC 97 08/14/2015   ALT 13 08/14/2015   AST 14 08/14/2015   NA 139 08/19/2015   K 4.5 08/19/2015   CL 108 08/19/2015   CREATININE 0.77 08/19/2015   BUN 13 08/19/2015   CO2 27 08/19/2015   TSH 1.30 08/14/2015   HGBA1C 5.5 08/29/2014    Dg Lumbar Spine Complete  Result Date: 12/22/2015 CLINICAL DATA:  One week of constant low back pain. No mention of injury. EXAM: LUMBAR SPINE - COMPLETE 4+ VIEW COMPARISON:  None in PACs FINDINGS: The lumbar vertebral bodies are preserved in height. The pedicles and transverse processes are intact. The disc space heights are well maintained with exception of L5-S1 where there is moderate narrowing. There is no spondylolisthesis. There is mild facet joint hypertrophy at L5-S1. The observed portions of the sacrum are normal. Essure contraceptive devices are present in the pelvis. IMPRESSION: Degenerative disc space narrowing and facet joint hypertrophy at L5-S1. No compression fracture  nor other acute lumbar spine abnormality. Electronically Signed   By: David  SwazilandJordan M.D.   On: 12/22/2015 15:11    Assessment & Plan:   Christina [name] was seen today for depression.  Diagnoses and all orders for this visit:  Depression with somatization- I will add bupropion toTrintellix to see if she will get better resolution of her depressive symptoms. -     buPROPion (WELLBUTRIN XL) 150 MG 24 hr tablet; Take 1 tablet (150 mg total) by mouth daily.  Tobacco use disorder- will try the combination of bupropion and Chantix to see if it will be successful in helping her quit smoking. -     buPROPion (WELLBUTRIN XL) 150 MG 24 hr tablet; Take 1 tablet (150 mg total) by mouth daily. -     varenicline (CHANTIX CONTINUING MONTH PAK) 1 MG tablet; Take 1 tablet (1 mg total) by mouth 2 (two) times daily. -     varenicline (CHANTIX STARTING MONTH PAK) 0.5 MG X 11 & 1 MG X 42 tablet; Take one 0.5 mg tablet by mouth once daily for 3 days, then increase to one 0.5 mg tablet twice daily for 4 days, then increase  to one 1 mg tablet twice daily.   I am having Ms. Davidson start on buPROPion, varenicline, and varenicline. I am also having her maintain her multivitamin, etonogestrel-ethinyl estradiol, vitamin B-12, Omega-3 Fatty Acids (OMEGA 3 PO), vortioxetine HBr, Diclofenac Sodium, meloxicam, phentermine, and cyclobenzaprine.  Meds ordered this encounter  Medications  . buPROPion (WELLBUTRIN XL) 150 MG 24 hr tablet    Sig: Take 1 tablet (150 mg total) by mouth daily.    Dispense:  90 tablet    Refill:  1  . varenicline (CHANTIX CONTINUING MONTH PAK) 1 MG tablet    Sig: Take 1 tablet (1 mg total) by mouth 2 (two) times daily.    Dispense:  60 tablet    Refill:  3  . varenicline (CHANTIX STARTING MONTH PAK) 0.5 MG X 11 & 1 MG X 42 tablet    Sig: Take one 0.5 mg tablet by mouth once daily for 3 days, then increase to one 0.5 mg tablet twice daily for 4 days, then increase to one 1 mg tablet twice daily.     Dispense:  53 tablet    Refill:  0     Follow-up: Return in about 3 months (around 03/30/2016).  Sanda Linger, MD

## 2016-01-04 ENCOUNTER — Encounter: Payer: Self-pay | Admitting: Internal Medicine

## 2016-01-27 ENCOUNTER — Encounter: Payer: Self-pay | Admitting: Family Medicine

## 2016-01-27 ENCOUNTER — Ambulatory Visit (INDEPENDENT_AMBULATORY_CARE_PROVIDER_SITE_OTHER): Payer: 59 | Admitting: Family Medicine

## 2016-01-27 DIAGNOSIS — M999 Biomechanical lesion, unspecified: Secondary | ICD-10-CM

## 2016-01-27 DIAGNOSIS — S29012D Strain of muscle and tendon of back wall of thorax, subsequent encounter: Secondary | ICD-10-CM

## 2016-01-27 NOTE — Progress Notes (Signed)
Tawana ScaleZach Kasey Hansell D.O. [facility] [address] [place], [place] [address] Phone: [phone] Subjective:    CC: Back pain follow-up  OZH:YQMVHQIONGHPI:Subjective  [name] Bronson CurbM Davidson is a 35 y.o. female coming in with complaint of  Lower back pain. Patient states though that the lower back pain seems to be doing relatively well. Increasing upper back pain. Feels like she slept on it wrong. States that the patient has helped in the past. Denies any radiation to the hands. Has worsening pain of her chronic other problems. Does not know what was the exacerbating factor. Has taken ibuprofen with mild benefit.     Past Medical History:  Diagnosis Date  . Migraine   . Normal pregnancy 03/11/2012  . Ovarian cyst   . Postpartum hemorrhage   . SVD (spontaneous vaginal delivery) 03/11/2012   No past surgical history on file. Social History   Social History  . Marital status: Divorced    Spouse name: N/A  . Number of children: N/A  . Years of education: N/A   Occupational History  . cma  [employer]   Social History Main Topics  . Smoking status: Current Every Day Smoker    Packs/day: 0.50    Years: 20.00    Types: Cigarettes  . Smokeless tobacco: Not on file  . Alcohol use No  . Drug use: No  . Sexual activity: Yes    Partners: Male   Other Topics Concern  . Not on file   Social History Narrative  . No narrative on file   Allergies  Allergen Reactions  . Hydrocodone Itching  . Prednisone Other (See Comments)    Hyper, agitated, aggressive   Family History  Problem Relation Age of Onset  . Arthritis Father   . Sudden death Father [id number]    Sinus Cancer  . Cancer Father     nasal cancer  . Arthritis Maternal Grandfather   . Hyperlipidemia Maternal Grandfather   . Heart disease Maternal Grandfather   . Breast cancer Maternal Grandmother   . Arthritis Maternal Grandmother   . Lung cancer Maternal Grandmother   . Heart disease Maternal Grandmother   . Hyperlipidemia  Paternal Grandmother   . Stroke Paternal Grandfather   . Bipolar disorder Mother   . Sudden death Brother     GSW    Past medical history, social, surgical and family history all reviewed in electronic medical record.  No pertanent information unless stated regarding to the chief complaint.   Review of Systems: No headache, visual changes, nausea, vomiting, diarrhea, constipation, dizziness, abdominal pain, skin rash, fevers, chills, night sweats, weight loss, swollen lymph nodes, body aches, joint swelling, muscle aches, chest pain, shortness of breath, mood changes.   Objective  There were no vitals taken for this visit.  Systems examined below as of 01/27/16 General: NAD A&O x3 mood, affect normal  HEENT: Pupils equal, extraocular movements intact no nystagmus Respiratory: not short of breath at rest or with speaking Cardiovascular: No lower extremity edema, non tender Skin: Warm dry intact with no signs of infection or rash on extremities or on axial skeleton. Abdomen: Soft nontender, no masses Neuro: Cranial nerves  intact, neurovascularly intact in all extremities with 2+ DTRs and 2+ pulses. Lymph: No lymphadenopathy appreciated today  Gait normal with good balance and coordination.  MSK: Non tender with full range of motion and good stability and symmetric strength and tone of shoulders, elbows, wrist,  knee hips and ankles bilaterally.  Back Exam:  Inspection: Unremarkable  Motion: Flexion 35 deg, Extension \\25  deg, Side Bending to 35 deg bilaterally,  Rotation to 35 deg bilaterally  SLR laying: Negative  XSLR laying: Negative  Palpable tenderness: More increased tenderness in the paraspinal musculature of the thoracic spine mostly around the inferior scapula FABER: Positive right still present but less pain in the sacroiliac joint Sensory change: Gross sensation intact to all lumbar and sacral dermatomes.  Reflexes: 2+ at both patellar tendons, 2+ at achilles tendons,  Babinski's downgoing.  Strength at foot  Plantar-flexion: 5/5 Dorsi-flexion: 5/5 Eversion: 5/5 Inversion: 5/5  Leg strength  Quad: 5/5 Hamstring: 5/5 Hip flexor: 5/5 Hip abductors: 4+/5  Gait unremarkable.   Osteopathic findings C2 flexed rotated and side bent right C4 flexed rotated and side bent left C6 flexed rotated and side bent right T5 extended rotated and side bent left with inhaled fifth rib T8 extended rotated and side bent right  L2 flexed rotated and side bent right sacrum right on right  sacrum right on right Impression and Recommendations:     This case required medical decision making of moderate complexity.      Note: This dictation was prepared with Dragon dictation along with smaller phrase technology. Any transcriptional errors that result from this process are unintentional.

## 2016-01-27 NOTE — Assessment & Plan Note (Signed)
Decision today to treat with OMT was based on Physical Exam  After verbal consent patient was treated with HVLA, ME, FPR techniques in thoracic, cervical, and lumbar and sacral areas  Patient tolerated the procedure well with improvement in symptoms  Patient given exercises, stretches and lifestyle modifications  See medications in patient instructions if given  Patient will follow up in 3-4 weeks

## 2016-01-27 NOTE — Patient Instructions (Signed)
Good to see you   

## 2016-01-27 NOTE — Assessment & Plan Note (Signed)
Exacerbation. Worsening symptoms. We discussed posture and ergonomics. We discussed proper bra fitting. Patient will continue to be active otherwise. Follow-up again in 3 weeks.

## 2016-02-10 ENCOUNTER — Other Ambulatory Visit: Payer: Self-pay | Admitting: *Deleted

## 2016-02-10 MED ORDER — PHENTERMINE HCL 30 MG PO CAPS: 30.0000 mg | ORAL_CAPSULE | ORAL | 2 refills | Status: DC

## 2016-02-10 NOTE — Telephone Encounter (Signed)
Printed Rx faxed to pharmacy on file.

## 2016-02-10 NOTE — Telephone Encounter (Signed)
Pt is requesting refill on phentermine 30 mg. Ok to Rf?

## 2016-03-16 ENCOUNTER — Ambulatory Visit (INDEPENDENT_AMBULATORY_CARE_PROVIDER_SITE_OTHER): Payer: 59 | Admitting: Family Medicine

## 2016-03-16 DIAGNOSIS — M999 Biomechanical lesion, unspecified: Secondary | ICD-10-CM | POA: Diagnosis not present

## 2016-03-16 DIAGNOSIS — M533 Sacrococcygeal disorders, not elsewhere classified: Secondary | ICD-10-CM

## 2016-03-16 NOTE — Assessment & Plan Note (Signed)
Worsening symptoms at this time. Discussed with patient at great length. We do home exercises, icing protocol, which activities doing which ones to avoid. Patient come back and see me again in 4-6 weeks for further evaluation and treatment.

## 2016-03-16 NOTE — Assessment & Plan Note (Signed)
Decision today to treat with OMT was based on Physical Exam  After verbal consent patient was treated with HVLA, ME, FPR techniques in thoracic, cervical, and lumbar and sacral areas  Patient tolerated the procedure well with improvement in symptoms  Patient given exercises, stretches and lifestyle modifications  See medications in patient instructions if given  Patient will follow up in 3-4 weeks      

## 2016-03-16 NOTE — Progress Notes (Signed)
Tawana ScaleZach Lalaine Overstreet D.O. [facility] [address] [place], [place] [address] Phone: [phone] Subjective:    CC: Back pain follow-up  OZH:YQMVHQIONGHPI:Subjective  Christina GarbeStefannie Bronson CurbM [name] is a 35 y.o. female coming in with complaint of  Lower back pain. Had an exacerbation. Has been 3 months since we seen patient. Patient states that unfortunate she is having worsening pain. Seems to be still secondary to her mattress. States that when she wakes up in the morning very difficult to get out of bed. Patient did sleep in a different that over the holidays and states that that was more beneficial. Now back in her own bed and is having worsening symptoms.     Past Medical History:  Diagnosis Date  . Migraine   . Normal pregnancy 03/11/2012  . Ovarian cyst   . Postpartum hemorrhage   . SVD (spontaneous vaginal delivery) 03/11/2012   No past surgical history on file. Social History   Social History  . Marital status: Divorced    Spouse name: N/A  . Number of children: N/A  . Years of education: N/A   Occupational History  . cma     Social History Main Topics  . Smoking status: Current Every Day Smoker    Packs/day: 0.50    Years: 20.00    Types: Cigarettes  . Smokeless tobacco: Not on file  . Alcohol use No  . Drug use: No  . Sexual activity: Yes    Partners: Male   Other Topics Concern  . Not on file   Social History Narrative  . No narrative on file   Allergies  Allergen Reactions  . Hydrocodone Itching  . Prednisone Other (See Comments)    Hyper, agitated, aggressive   Family History  Problem Relation Age of Onset  . Arthritis Father   . Sudden death Father [id number]    Sinus Cancer  . Cancer Father     nasal cancer  . Arthritis Maternal Grandfather   . Hyperlipidemia Maternal Grandfather   . Heart disease Maternal Grandfather   . Breast cancer Maternal Grandmother   . Arthritis Maternal Grandmother   . Lung cancer Maternal Grandmother   . Heart disease  Maternal Grandmother   . Hyperlipidemia Paternal Grandmother   . Stroke Paternal Grandfather   . Bipolar disorder Mother   . Sudden death Brother     GSW    Past medical history, social, surgical and family history all reviewed in electronic medical record.  No pertanent information unless stated regarding to the chief complaint.   Review of Systems: No headache, visual changes, nausea, vomiting, diarrhea, constipation, dizziness, abdominal pain, skin rash, fevers, chills, night sweats, weight loss, swollen lymph nodes, body aches, joint swelling, muscle aches, chest pain, shortness of breath, mood changes.    Objective  There were no vitals taken for this visit.  Systems examined below as of 03/16/16 General: NAD A&O x3 mood, affect normal  HEENT: Pupils equal, extraocular movements intact no nystagmus Respiratory: not short of breath at rest or with speaking Cardiovascular: No lower extremity edema, non tender Skin: Warm dry intact with no signs of infection or rash on extremities or on axial skeleton. Abdomen: Soft nontender, no masses Neuro: Cranial nerves  intact, neurovascularly intact in all extremities with 2+ DTRs and 2+ pulses. Lymph: No lymphadenopathy appreciated today  Gait normal with good balance and coordination.  MSK: Non tender with full range of motion and good stability and symmetric strength and tone of shoulders,  elbows, wrist,  knee hips and ankles bilaterally.     Back Exam:  Inspection: Unremarkable  Motion: Flexion 35 deg, Extension 25 deg, Side Bending to 35 deg bilaterally,  Rotation to 35 deg bilaterally  SLR laying: Negative  XSLR laying: Negative  Palpable tenderness: More increased tenderness in the paraspinal musculature of the thoracic spine mostly around the inferior scapula FABER: Positive right worse exam Sensory change: Gross sensation intact to all lumbar and sacral dermatomes.  Reflexes: 2+ at both patellar tendons, 2+ at achilles tendons,  Babinski's downgoing.  Strength at foot  Plantar-flexion: 5/5 Dorsi-flexion: 5/5 Eversion: 5/5 Inversion: 5/5  Leg strength  Quad: 5/5 Hamstring: 5/5 Hip flexor: 5/5 Hip abductors: 4+/5  Gait unremarkable.  Osteopathic findings Cervical C2 flexed rotated and side bent right C4 flexed rotated and side bent left C6 flexed rotated and side bent left T3 extended rotated and side bent right inhaled third rib T9 extended rotated and side bent left L2 flexed rotated and side bent right Sacrum right on right  Impression and Recommendations:     This case required medical decision making of moderate complexity.      Note: This dictation was prepared with Dragon dictation along with smaller phrase technology. Any transcriptional errors that result from this process are unintentional.

## 2016-04-20 ENCOUNTER — Ambulatory Visit (INDEPENDENT_AMBULATORY_CARE_PROVIDER_SITE_OTHER): Payer: 59 | Admitting: Internal Medicine

## 2016-04-20 ENCOUNTER — Encounter: Payer: Self-pay | Admitting: Internal Medicine

## 2016-04-20 ENCOUNTER — Ambulatory Visit (INDEPENDENT_AMBULATORY_CARE_PROVIDER_SITE_OTHER): Payer: 59 | Admitting: Family Medicine

## 2016-04-20 VITALS — BP 110/72 | HR 76 | Temp 97.9°F | Resp 16 | Ht 65.0 in | Wt 189.0 lb

## 2016-04-20 DIAGNOSIS — G4700 Insomnia, unspecified: Secondary | ICD-10-CM | POA: Diagnosis not present

## 2016-04-20 DIAGNOSIS — M533 Sacrococcygeal disorders, not elsewhere classified: Secondary | ICD-10-CM | POA: Diagnosis not present

## 2016-04-20 DIAGNOSIS — M999 Biomechanical lesion, unspecified: Secondary | ICD-10-CM | POA: Diagnosis not present

## 2016-04-20 DIAGNOSIS — F329 Major depressive disorder, single episode, unspecified: Secondary | ICD-10-CM | POA: Diagnosis not present

## 2016-04-20 DIAGNOSIS — F450 Somatization disorder: Secondary | ICD-10-CM | POA: Diagnosis not present

## 2016-04-20 DIAGNOSIS — H52223 Regular astigmatism, bilateral: Secondary | ICD-10-CM | POA: Diagnosis not present

## 2016-04-20 DIAGNOSIS — H5201 Hypermetropia, right eye: Secondary | ICD-10-CM | POA: Diagnosis not present

## 2016-04-20 MED ORDER — DOXEPIN HCL 6 MG PO TABS: 1.0000 | ORAL_TABLET | Freq: Every evening | ORAL | 11 refills | Status: DC | PRN

## 2016-04-20 MED ORDER — DULOXETINE HCL 30 MG PO CPEP: 30.0000 mg | ORAL_CAPSULE | Freq: Every day | ORAL | 0 refills | Status: DC

## 2016-04-20 NOTE — Progress Notes (Signed)
Subjective:  Patient ID: Christina [name], female    DOB: [birth year]  Age: 36 y.o. MRN: [id number]  CC: Depression   HPI [facility] [name] presents for follow-up on depression. She had to stop taking Trintellix about a month ago because it was too expensive. She was doing well on it. She complains of ongoing symptoms including frequent awakenings, weight gain, irritability, chronic low back pain, and moodiness.  Outpatient Medications Prior to Visit  Medication Sig Dispense Refill  . buPROPion (WELLBUTRIN XL) 150 MG 24 hr tablet Take 1 tablet (150 mg total) by mouth daily. 90 tablet 1  . phentermine 30 MG capsule Take 1 capsule (30 mg total) by mouth every morning. Reported on 04/24/2015 30 capsule 2  . varenicline (CHANTIX CONTINUING MONTH PAK) 1 MG tablet Take 1 tablet (1 mg total) by mouth 2 (two) times daily. 60 tablet 3  . vitamin B-12 (CYANOCOBALAMIN) 1000 MCG tablet Take 1,000 mcg by mouth daily.    . varenicline (CHANTIX STARTING MONTH PAK) 0.5 MG X 11 & 1 MG X 42 tablet Take one 0.5 mg tablet by mouth once daily for 3 days, then increase to one 0.5 mg tablet twice daily for 4 days, then increase to one 1 mg tablet twice daily. 53 tablet 0  . cyclobenzaprine (FLEXERIL) 10 MG tablet   0  . meloxicam (MOBIC) 15 MG tablet Take 1 tablet (15 mg total) by mouth daily. (Patient not taking: Reported on 04/20/2016) 90 tablet 1  . Omega-3 Fatty Acids (OMEGA 3 PO) Take by mouth.    . Diclofenac Sodium (PENNSAID) 2 % SOLN Place 2 application onto the skin 2 (two) times daily. (Patient not taking: Reported on 04/20/2016) 112 g 3  . etonogestrel-ethinyl estradiol (NUVARING) 0.12-0.015 MG/24HR vaginal ring Place 1 each vaginally every 28 (twenty-eight) days. Insert vaginally and leave in place for 3 consecutive weeks, then remove for 1 week.    . Multiple Vitamin (MULTIVITAMIN) tablet Take 1 tablet by mouth daily.    . Vortioxetine HBr (TRINTELLIX) 20 MG TABS Take 20 mg by  mouth daily. 30 tablet 11   No facility-administered medications prior to visit.     ROS Review of Systems  Constitutional: Positive for unexpected weight change. Negative for diaphoresis and fatigue.  HENT: Negative.   Eyes: Negative for visual disturbance.  Respiratory: Negative for chest tightness, shortness of breath and wheezing.   Cardiovascular: Negative.  Negative for chest pain, palpitations and leg swelling.  Gastrointestinal: Negative for abdominal pain and diarrhea.  Genitourinary: Negative.   Musculoskeletal: Positive for back pain. Negative for arthralgias and myalgias.  Neurological: Negative.   Hematological: Negative for adenopathy. Does not bruise/bleed easily.  Psychiatric/Behavioral: Positive for dysphoric mood and sleep disturbance. Negative for decreased concentration, self-injury and suicidal ideas. The patient is not nervous/anxious.     Objective:  BP 110/72   Pulse 76   Temp 97.9 F (36.6 C) (Oral)   Resp 16   Ht 5\' 5"  (1.651 m)   Wt 189 lb (85.7 kg)   SpO2 98%   BMI 31.45 kg/m   BP Readings from Last 3 Encounters:  04/20/16 110/72  12/29/15 112/78  08/19/15 110/90    Wt Readings from Last 3 Encounters:  04/20/16 189 lb (85.7 kg)  12/29/15 181 lb (82.1 kg)  08/14/15 183 lb (83 kg)    Physical Exam  Constitutional: She is oriented to person, place, and time. No distress.  HENT:  Mouth/Throat: Oropharynx is clear and  moist. No oropharyngeal exudate.  Eyes: Conjunctivae are normal. Right eye exhibits no discharge. Left eye exhibits no discharge. No scleral icterus.  Neck: Normal range of motion. Neck supple. No JVD present. No tracheal deviation present. No thyromegaly present.  Cardiovascular: Normal rate, regular rhythm, normal heart sounds and intact distal pulses.  Exam reveals no gallop.   No murmur heard. Pulmonary/Chest: Effort normal and breath sounds normal. No stridor. No respiratory distress. She has no wheezes. She has no rales.  She exhibits no tenderness.  Abdominal: Soft. Bowel sounds are normal. She exhibits no distension and no mass. There is no tenderness. There is no rebound and no guarding.  Musculoskeletal: Normal range of motion. She exhibits no edema, tenderness or deformity.  Lymphadenopathy:    She has no cervical adenopathy.  Neurological: She is oriented to person, place, and time.  Skin: Skin is warm and dry. No rash noted. She is not diaphoretic. No erythema. No pallor.  Psychiatric: Her behavior is normal. Judgment and thought content normal. Her mood appears anxious. Her speech is not rapid and/or pressured, not delayed and not tangential. She is not slowed and not withdrawn. She exhibits a depressed mood. She expresses no homicidal and no suicidal ideation. She expresses no suicidal plans and no homicidal plans.  tearful She is attentive.  Vitals reviewed.   Lab Results  Component Value Date   WBC 10.6 (H) 11/14/2015   HGB 13.7 11/14/2015   HCT 39.9 11/14/2015   PLT 243.0 11/14/2015   GLUCOSE 96 08/19/2015   CHOL 166 08/14/2015   TRIG 69.0 08/14/2015   HDL 54.90 08/14/2015   LDLCALC 97 08/14/2015   ALT 13 08/14/2015   AST 14 08/14/2015   NA 139 08/19/2015   K 4.5 08/19/2015   CL 108 08/19/2015   CREATININE 0.77 08/19/2015   BUN 13 08/19/2015   CO2 27 08/19/2015   TSH 1.30 08/14/2015   HGBA1C 5.5 08/29/2014    Dg Lumbar Spine Complete  Result Date: 12/22/2015 CLINICAL DATA:  One week of constant low back pain. No mention of injury. EXAM: LUMBAR SPINE - COMPLETE 4+ VIEW COMPARISON:  None in PACs FINDINGS: The lumbar vertebral bodies are preserved in height. The pedicles and transverse processes are intact. The disc space heights are well maintained with exception of L5-S1 where there is moderate narrowing. There is no spondylolisthesis. There is mild facet joint hypertrophy at L5-S1. The observed portions of the sacrum are normal. Essure contraceptive devices are present in the pelvis.  IMPRESSION: Degenerative disc space narrowing and facet joint hypertrophy at L5-S1. No compression fracture nor other acute lumbar spine abnormality. Electronically Signed   By: David  [place] M.D.   On: 12/22/2015 15:11    Assessment & Plan:   Kamrie was seen today for depression.  Diagnoses and all orders for this visit:  Depression with somatization -     DULoxetine (CYMBALTA) 30 MG capsule; Take 1 capsule (30 mg total) by mouth daily.  Middle insomnia -     Doxepin HCl (SILENOR) 6 MG TABS; Take 1 tablet (6 mg total) by mouth at bedtime as needed.   I have discontinued Ms. Cairrikier Davidson's multivitamin, etonogestrel-ethinyl estradiol, vortioxetine HBr, and Diclofenac Sodium. I am also having her start on DULoxetine and Doxepin HCl. Additionally, I am having her maintain her vitamin B-12, Omega-3 Fatty Acids (OMEGA 3 PO), meloxicam, cyclobenzaprine, buPROPion, varenicline, and phentermine.  Meds ordered this encounter  Medications  . DULoxetine (CYMBALTA) 30 MG capsule  Sig: Take 1 capsule (30 mg total) by mouth daily.    Dispense:  30 capsule    Refill:  0  . Doxepin HCl (SILENOR) 6 MG TABS    Sig: Take 1 tablet (6 mg total) by mouth at bedtime as needed.    Dispense:  30 tablet    Refill:  11     Follow-up: Return in about 3 months (around 07/19/2016).  Sanda Lingerhomas Percival Glasheen, MD

## 2016-04-20 NOTE — Patient Instructions (Signed)
Good to see you  Had been 4 weeks New hip flexor exercises Good luck with the dentist.  See me again in 4-6 weeks.

## 2016-04-20 NOTE — Patient Instructions (Signed)
Major Depressive Disorder, Adult Major depressive disorder (MDD) is a mental health condition. It may also be called clinical depression or unipolar depression. MDD usually causes feelings of sadness, hopelessness, or helplessness. MDD can also cause physical symptoms. It can interfere with work, school, relationships, and other everyday activities. MDD may be mild, moderate, or severe. It may occur once (single episode major depressive disorder) or it may occur multiple times (recurrent major depressive disorder). What are the causes? The exact cause of this condition is not known. MDD is most likely caused by a combination of things, which may include:  Genetic factors. These are traits that are passed along from parent to child.  Individual factors. Your personality, your behavior, and the way you handle your thoughts and feelings may contribute to MDD. This includes personality traits and behaviors learned from others.  Physical factors, such as: ? Differences in the part of your brain that controls emotion. This part of your brain may be different than it is in people who do not have MDD. ? Long-term (chronic) medical or psychiatric illnesses.  Social factors. Traumatic experiences or major life changes may play a role in the development of MDD.  What increases the risk? This condition is more likely to develop in women. The following factors may also make you more likely to develop MDD:  A family history of depression.  Troubled family relationships.  Abnormally low levels of certain brain chemicals.  Traumatic events in childhood, especially abuse or the loss of a parent.  Being under a lot of stress, or long-term stress, especially from upsetting life experiences or losses.  A history of: ? Chronic physical illness. ? Other mental health disorders. ? Substance abuse.  Poor living conditions.  Experiencing social exclusion or discrimination on a regular basis.  What are  the signs or symptoms? The main symptoms of MDD typically include:  Constant depressed or irritable mood.  Loss of interest in things and activities.  MDD symptoms may also include:  Sleeping or eating too much or too little.  Unexplained weight change.  Fatigue or low energy.  Feelings of worthlessness or guilt.  Difficulty thinking clearly or making decisions.  Thoughts of suicide or of harming others.  Physical agitation or weakness.  Isolation.  Severe cases of MDD may also occur with other symptoms, such as:  Delusions or hallucinations, in which you imagine things that are not real (psychotic depression).  Low-level depression that lasts at least a year (chronic depression or persistent depressive disorder).  Extreme sadness and hopelessness (melancholic depression).  Trouble speaking and moving (catatonic depression).  How is this diagnosed? This condition may be diagnosed based on:  Your symptoms.  Your medical history, including your mental health history. This may involve tests to evaluate your mental health. You may be asked questions about your lifestyle, including any drug and alcohol use, and how long you have had symptoms of MDD.  A physical exam.  Blood tests to rule out other conditions.  You must have a depressed mood and at least four other MDD symptoms most of the day, nearly every day in the same 2-week timeframe before your health care provider can confirm a diagnosis of MDD. How is this treated? This condition is usually treated by mental health professionals, such as psychologists, psychiatrists, and clinical social workers. You may need more than one type of treatment. Treatment may include:  Psychotherapy. This is also called talk therapy or counseling. Types of psychotherapy include: ? Cognitive behavioral   therapy (CBT). This type of therapy teaches you to recognize unhealthy feelings, thoughts, and behaviors, and replace them with  positive thoughts and actions. ? Interpersonal therapy (IPT). This helps you to improve the way you relate to and communicate with others. ? Family therapy. This treatment includes members of your family.  Medicine to treat anxiety and depression, or to help you control certain emotions and behaviors.  Lifestyle changes, such as: ? Limiting alcohol and drug use. ? Exercising regularly. ? Getting plenty of sleep. ? Making healthy eating choices. ? Spending more time outdoors.  Treatments involving stimulation of the brain can be used in situations with extremely severe symptoms, or when medicine or other therapies do not work over time. These treatments include electroconvulsive therapy, transcranial magnetic stimulation, and vagal nerve stimulation. Follow these instructions at home: Activity  Return to your normal activities as told by your health care provider.  Exercise regularly and spend time outdoors as told by your health care provider. General instructions  Take over-the-counter and prescription medicines only as told by your health care provider.  Do not drink alcohol. If you drink alcohol, limit your alcohol intake to no more than 1 drink a day for nonpregnant women and 2 drinks a day for men. One drink equals 12 oz of beer, 5 oz of wine, or 1 oz of hard liquor. Alcohol can affect any antidepressant medicines you are taking. Talk to your health care provider about your alcohol use.  Eat a healthy diet and get plenty of sleep.  Find activities that you enjoy doing, and make time to do them.  Consider joining a support group. Your health care provider may be able to recommend a support group.  Keep all follow-up visits as told by your health care provider. This is important. Where to find more information: National Alliance on Mental Illness  www.nami.org  U.S. National Institute of Mental Health  www.nimh.nih.gov  National Suicide Prevention  Lifeline  1-800-273-TALK (8255). This is free, 24-hour help.  Contact a health care provider if:  Your symptoms get worse.  You develop new symptoms. Get help right away if:  You self-harm.  You have serious thoughts about hurting yourself or others.  You see, hear, taste, smell, or feel things that are not present (hallucinate). This information is not intended to replace advice given to you by your health care provider. Make sure you discuss any questions you have with your health care provider. Document Released: 07/03/2012 Document Revised: 11/13/2015 Document Reviewed: 09/17/2015 Elsevier Interactive Patient Education  2017 Elsevier Inc.  

## 2016-04-20 NOTE — Progress Notes (Signed)
Christina [name] [address] [place], [place] [address] Phone: [phone] Subjective:    CC: Back pain follow-up  [id number]  Christina [name] is a 36 y.o. female coming in with complaint of  Lower back pain. Patient was seen 4 weeks ago. Was having some more discomfort. States though that overall seems to be doing relatively better but still has significant tightness. Can be worse at night. Doesn't know any specific activities though that seems to be making it worse. Has tried things such as losing weight as well as different shoes. Continues to to have a dull, throbbing aching pain at all times.     Past Medical History:  Diagnosis Date  . Migraine   . Normal pregnancy 03/11/2012  . Ovarian cyst   . Postpartum hemorrhage   . SVD (spontaneous vaginal delivery) 03/11/2012   No past surgical history on file. Social History   Social History  . Marital status: Divorced    Spouse name: N/A  . Number of children: N/A  . Years of education: N/A   Occupational History  . cma  [employer]   Social History Main Topics  . Smoking status: Current Every Day Smoker    Packs/day: 0.50    Years: 20.00    Types: Cigarettes  . Smokeless tobacco: Not on file  . Alcohol use No  . Drug use: No  . Sexual activity: Yes    Partners: Male   Other Topics Concern  . Not on file   Social History Narrative  . No narrative on file   Allergies  Allergen Reactions  . Hydrocodone Itching  . Prednisone Other (See Comments)    Hyper, agitated, aggressive   Family History  Problem Relation Age of Onset  . Arthritis Father   . Sudden death Father 1    Sinus Cancer  . Cancer Father     nasal cancer  . Arthritis Maternal Grandfather   . Hyperlipidemia Maternal Grandfather   . Heart disease Maternal Grandfather   . Breast cancer Maternal Grandmother   . Arthritis Maternal Grandmother   . Lung cancer Maternal Grandmother   . Heart disease  Maternal Grandmother   . Hyperlipidemia Paternal Grandmother   . Stroke Paternal Grandfather   . Bipolar disorder Mother   . Sudden death Brother     GSW    Past medical history, social, surgical and family history all reviewed in electronic medical record.  No pertanent information unless stated regarding to the chief complaint.   Review of Systems: No headache, visual changes, nausea, vomiting, diarrhea, constipation, dizziness, abdominal pain, skin rash, fevers, chills, night sweats, weight loss, swollen lymph nodes,chest pain, shortness of breath, mood changes.     Objective  There were no vitals taken for this visit.  Systems examined below as of 04/20/16 General: NAD A&O x3 mood, affect normal  HEENT: Pupils equal, extraocular movements intact no nystagmus Respiratory: not short of breath at rest or with speaking Cardiovascular: No lower extremity edema, non tender Skin: Warm dry intact with no signs of infection or rash on extremities or on axial skeleton. Abdomen: Soft nontender, no masses Neuro: Cranial nerves  intact, neurovascularly intact in all extremities with 2+ DTRs and 2+ pulses. Lymph: No lymphadenopathy appreciated today  Gait normal with good balance and coordination.  MSK: Non tender with full range of motion and good stability and symmetric strength and tone of shoulders, elbows, wrist,  knee hips and ankles bilaterally.  Back Exam:  Inspection: Unremarkable  Motion: Flexion 45 deg, Extension 25 deg, Side Bending to 35 deg bilaterally,  Rotation to 45 deg bilaterally  SLR laying: Negative  XSLR laying: Negative  Palpable tenderness: Continued tightness and tenderness of the paraspinal musculature especially tightness of the hip flexor on the right greater than left.Marland Kitchen. FABER: negative. Sensory change: Gross sensation intact to all lumbar and sacral dermatomes.  Reflexes: 2+ at both patellar tendons, 2+ at achilles tendons, Babinski's downgoing.  Strength at  foot  Plantar-flexion: 5/5 Dorsi-flexion: 5/5 Eversion: 5/5 Inversion: 5/5  Leg strength  Quad: 5/5 Hamstring: 5/5 Hip flexor: 5/5 Hip abductors: 4/5 but symmetric Gait unremarkable.   Osteopathic findings Cervical C2 flexed rotated and side bent right C4 flexed rotated and side bent left C6 flexed rotated and side bent left T3 extended rotated and side bent right inhaled third rib T9 extended rotated and side bent left T11 extended rotated and side bent right L2 flexed rotated and side bent right Sacrum right on right  Impression and Recommendations:     This case required medical decision making of moderate complexity.      Note: This dictation was prepared with Dragon dictation along with smaller phrase technology. Any transcriptional errors that result from this process are unintentional.

## 2016-04-20 NOTE — Progress Notes (Signed)
Pre visit review using our clinic review tool, if applicable. No additional management support is needed unless otherwise documented below in the visit note. 

## 2016-04-20 NOTE — Assessment & Plan Note (Signed)
Decision today to treat with OMT was based on Physical Exam  After verbal consent patient was treated with HVLA, ME, FPR techniques in cervical, thoracic, lumbar and sacral areas  Patient tolerated the procedure well with improvement in symptoms  Patient given exercises, stretches and lifestyle modifications  See medications in patient instructions if given  Patient will follow up in 4-6 weeks 

## 2016-04-20 NOTE — Assessment & Plan Note (Signed)
She continues to have sacroiliac dysfunction as well as tight hamstrings as well as tight hip flexors. Patient is going to work on home exercises on a greater patient is to try to increase activity as tolerated. Patient will see me every 46 weeks for further evaluation and treatment.

## 2016-04-27 ENCOUNTER — Telehealth: Payer: Self-pay | Admitting: Internal Medicine

## 2016-04-27 ENCOUNTER — Ambulatory Visit (INDEPENDENT_AMBULATORY_CARE_PROVIDER_SITE_OTHER): Payer: 59 | Admitting: *Deleted

## 2016-04-27 DIAGNOSIS — M545 Low back pain: Secondary | ICD-10-CM

## 2016-04-27 DIAGNOSIS — G8929 Other chronic pain: Secondary | ICD-10-CM

## 2016-04-27 MED ORDER — METHYLPREDNISOLONE ACETATE 80 MG/ML IJ SUSP
120.0000 mg | Freq: Once | INTRAMUSCULAR | Status: AC
  Administered 2016-04-27: 120 mg via INTRAMUSCULAR

## 2016-04-27 NOTE — Telephone Encounter (Signed)
Patient states she is having severe low back pain. Requesting 120 mg Depo. Please advise

## 2016-04-27 NOTE — Telephone Encounter (Signed)
yes

## 2016-05-10 ENCOUNTER — Ambulatory Visit (INDEPENDENT_AMBULATORY_CARE_PROVIDER_SITE_OTHER): Payer: 59 | Admitting: Family Medicine

## 2016-05-10 DIAGNOSIS — M533 Sacrococcygeal disorders, not elsewhere classified: Secondary | ICD-10-CM

## 2016-05-10 DIAGNOSIS — M999 Biomechanical lesion, unspecified: Secondary | ICD-10-CM | POA: Diagnosis not present

## 2016-05-10 MED ORDER — PREDNISONE 50 MG PO TABS: 50.0000 mg | ORAL_TABLET | Freq: Every day | ORAL | 0 refills | Status: DC

## 2016-05-10 MED ORDER — DIAZEPAM 5 MG PO TABS: 5.0000 mg | ORAL_TABLET | Freq: Two times a day (BID) | ORAL | 0 refills | Status: DC | PRN

## 2016-05-10 NOTE — Assessment & Plan Note (Signed)
Worsening symptoms. Has lower back as well as sacroiliac joint. Patient has had difficulty overall. We discussed possibly advance imaging with the amount of pain that she was in. Patient declined today. We discussed icing regimen and home exercises. Patient given prednisone as well as Valium for muscle relaxer. We will see how patient response. Follow-up again 4 weeks.

## 2016-05-10 NOTE — Progress Notes (Signed)
[facility] [address] [place], [place] [address] Phone: [phone] Subjective:    CC: Back pain follow-up  [id number]  Christina [name] is a 36 y.o. female coming in with complaint of  Lower back pain. Patient was seen 4 weeks ago. Worsening symptoms. Patient did attempt running. Having worsening pain of the lower back. No radiation down the leg. States though that is difficult even do daily activities. Being on her feet all day causes increasing pain as well.     Past Medical History:  Diagnosis Date  . Migraine   . Normal pregnancy 03/11/2012  . Ovarian cyst   . Postpartum hemorrhage   . SVD (spontaneous vaginal delivery) 03/11/2012   No past surgical history on file. Social History   Social History  . Marital status: Divorced    Spouse name: N/A  . Number of children: N/A  . Years of education: N/A   Occupational History  . cma  [employer]   Social History Main Topics  . Smoking status: Current Every Day Smoker    Packs/day: 0.50    Years: 20.00    Types: Cigarettes  . Smokeless tobacco: Never Used  . Alcohol use No  . Drug use: No  . Sexual activity: Yes    Partners: Male   Other Topics Concern  . Not on file   Social History Narrative  . No narrative on file   Allergies  Allergen Reactions  . Hydrocodone Itching  . Prednisone Other (See Comments)    Hyper, agitated, aggressive   Family History  Problem Relation Age of Onset  . Arthritis Father   . Sudden death Father 18    Sinus Cancer  . Cancer Father     nasal cancer  . Arthritis Maternal Grandfather   . Hyperlipidemia Maternal Grandfather   . Heart disease Maternal Grandfather   . Breast cancer Maternal Grandmother   . Arthritis Maternal Grandmother   . Lung cancer Maternal Grandmother   . Heart disease Maternal Grandmother   . Hyperlipidemia Paternal Grandmother   . Stroke Paternal Grandfather   . Bipolar disorder Mother   . Sudden  death Brother     GSW    Past medical history, social, surgical and family history all reviewed in electronic medical record.  No pertanent information unless stated regarding to the chief complaint.   Review of Systems: No headache, visual changes, nausea, vomiting, diarrhea, constipation, dizziness, abdominal pain, skin rash, fevers, chills, night sweats, weight loss, swollen lymph nodes, body aches, joint swelling, muscle aches, chest pain, shortness of breath, mood changes.     Objective  There were no vitals taken for this visit.  Review of Systems: No headache, visual changes, nausea, vomiting, diarrhea, constipation, dizziness, abdominal pain, skin rash, fevers, chills, night sweats, weight loss, swollen lymph nodes, body aches, joint swelling, muscle aches, chest pain, shortness of breath, mood changes.    Back Exam:  Inspection: Unremarkable  Motion: Flexion 45 deg, Extension 25 deg, Side Bending to 35 deg bilaterally,  Rotation to 45 deg bilaterally  SLR laying: Negative ightness bilaterally XSLR laying: Negative  Palpable tenderness: everely worsening  Pain to palpation in the paraspinal musculature of L5-S1 bilaterally. FABER: negative. Sensory change: Gross sensation intact to all lumbar and sacral dermatomes.  Reflexes: 2+ at both patellar tendons, 2+ at achilles tendons, Babinski's downgoing.  Strength at foot  Plantar-flexion: 5/5 Dorsi-flexion: 5/5 Eversion: 5/5 Inversion: 5/5  Leg strength  Quad: 5/5  Hamstring: 5/5 Hip flexor: 5/5 Hip abductors: 4/5 but symmetric Gait unremarkable.   Osteopathic findings Cervical C2 flexed rotated and side bent right C5 flexed rotated and side bent left C7 flexed rotated and side bent left T3 extended rotated and side bent right inhaled third rib T10 extended rotated and side bent left L2 flexed rotated and side bent left  l4 flexed rotated and side bent right Sacrum right on right   Impression and Recommendations:      This case required medical decision making of moderate complexity.      Note: This dictation was prepared with Dragon dictation along with smaller phrase technology. Any transcriptional errors that result from this process are unintentional.

## 2016-05-10 NOTE — Assessment & Plan Note (Signed)
Decision today to treat with OMT was based on Physical Exam  After verbal consent patient was treated with HVLA, ME, FPR techniques in cervical, thoracic, lumbar and sacral areas  Patient tolerated the procedure well with improvement in symptoms  Patient given exercises, stretches and lifestyle modifications  See medications in patient instructions if given  Patient will follow up in 3-4 weeks  

## 2016-05-10 NOTE — Patient Instructions (Signed)
Good to see you  Prednisone daily for 5 days Valium up to 2 times a day  Talk to me soon.

## 2016-05-24 ENCOUNTER — Ambulatory Visit (INDEPENDENT_AMBULATORY_CARE_PROVIDER_SITE_OTHER): Payer: 59 | Admitting: Internal Medicine

## 2016-05-24 ENCOUNTER — Encounter: Payer: Self-pay | Admitting: Internal Medicine

## 2016-05-24 VITALS — BP 112/72 | HR 88 | Temp 98.8°F | Resp 16 | Ht 65.0 in

## 2016-05-24 DIAGNOSIS — B309 Viral conjunctivitis, unspecified: Secondary | ICD-10-CM | POA: Diagnosis not present

## 2016-05-24 MED ORDER — TOBRAMYCIN-DEXAMETHASONE 0.3-0.1 % OP SUSP: 2.0000 [drp] | Freq: Four times a day (QID) | OPHTHALMIC | 0 refills | Status: DC

## 2016-05-24 NOTE — Progress Notes (Signed)
Subjective:  Patient ID: Christina [name], female    DOB: 06-23-1980  Age: 36 y.o. MRN: [id number]  CC: Conjunctivitis   HPI Christina [name] presents for a 3 day hx of red, itchy eyes with matting and drainage. She does not wear contacts and denies photophobia.  Outpatient Medications Prior to Visit  Medication Sig Dispense Refill  . buPROPion (WELLBUTRIN XL) 150 MG 24 hr tablet Take 1 tablet (150 mg total) by mouth daily. 90 tablet 1  . cyclobenzaprine (FLEXERIL) 10 MG tablet   0  . diazepam (VALIUM) 5 MG tablet Take 1 tablet (5 mg total) by mouth every 12 (twelve) hours as needed for anxiety (vertigo/dizziness). 10 tablet 0  . Doxepin HCl (SILENOR) 6 MG TABS Take 1 tablet (6 mg total) by mouth at bedtime as needed. 30 tablet 11  . DULoxetine (CYMBALTA) 30 MG capsule Take 1 capsule (30 mg total) by mouth daily. 30 capsule 0  . Omega-3 Fatty Acids (OMEGA 3 PO) Take by mouth.    . phentermine 30 MG capsule Take 1 capsule (30 mg total) by mouth every morning. Reported on 04/24/2015 30 capsule 2  . predniSONE (DELTASONE) 50 MG tablet Take 1 tablet (50 mg total) by mouth daily. 5 tablet 0  . varenicline (CHANTIX CONTINUING MONTH PAK) 1 MG tablet Take 1 tablet (1 mg total) by mouth 2 (two) times daily. 60 tablet 3  . vitamin B-12 (CYANOCOBALAMIN) 1000 MCG tablet Take 1,000 mcg by mouth daily.    . meloxicam (MOBIC) 15 MG tablet Take 1 tablet (15 mg total) by mouth daily. (Patient not taking: Reported on 05/24/2016) 90 tablet 1   No facility-administered medications prior to visit.     ROS Review of Systems  Constitutional: Negative for chills, fatigue and fever.  HENT: Positive for postnasal drip and rhinorrhea. Negative for congestion, facial swelling, sinus pressure, sore throat, trouble swallowing and voice change.   Eyes: Positive for discharge, redness and itching. Negative for photophobia, pain and visual disturbance.  Respiratory: Negative.  Negative for  cough, shortness of breath and wheezing.   Cardiovascular: Negative for chest pain, palpitations and leg swelling.  Gastrointestinal: Negative for abdominal pain, constipation, diarrhea, nausea and vomiting.  Endocrine: Negative.   Genitourinary: Negative.   Musculoskeletal: Negative.  Negative for neck pain.  Skin: Negative.  Negative for color change and rash.  Allergic/Immunologic: Negative.   Neurological: Negative.  Negative for dizziness, weakness and headaches.  Hematological: Negative.  Negative for adenopathy. Does not bruise/bleed easily.  Psychiatric/Behavioral: Negative.     Objective:  BP 112/72   Pulse 88   Temp 98.8 F (37.1 C) (Oral)   Resp 16   Ht 5\' 5"  (1.651 m)   LMP 05/04/2016   SpO2 98%   BP Readings from Last 3 Encounters:  05/24/16 112/72  04/20/16 110/72  12/29/15 112/78    Wt Readings from Last 3 Encounters:  04/20/16 189 lb (85.7 kg)  12/29/15 181 lb (82.1 kg)  08/14/15 183 lb (83 kg)    Physical Exam  Constitutional: She is oriented to person, place, and time.  Non-toxic appearance. She does not have a sickly appearance. She does not appear ill. No distress.  HENT:  Mouth/Throat: Oropharynx is clear and moist. No oropharyngeal exudate.  Eyes: EOM are normal. Right eye exhibits no chemosis and no discharge. Left eye exhibits no chemosis and no discharge. Right conjunctiva is injected. Right conjunctiva has no hemorrhage. Left conjunctiva is injected. Left conjunctiva has  no hemorrhage. No scleral icterus.    Neck: Normal range of motion. Neck supple. No JVD present. No tracheal deviation present. No thyromegaly present.  Cardiovascular: Normal rate, regular rhythm, normal heart sounds and intact distal pulses.  Exam reveals no gallop and no friction rub.   No murmur heard. Pulmonary/Chest: Effort normal and breath sounds normal. No stridor. No respiratory distress. She has no wheezes. She has no rales. She exhibits no tenderness.  Abdominal:  Soft. Bowel sounds are normal. She exhibits no distension and no mass. There is no tenderness. There is no rebound and no guarding.  Musculoskeletal: She exhibits no edema, tenderness or deformity.  Lymphadenopathy:    She has no cervical adenopathy.  Neurological: She is oriented to person, place, and time.  Skin: Skin is warm and dry. No rash noted. She is not diaphoretic. No erythema. No pallor.  Vitals reviewed.   Lab Results  Component Value Date   WBC 10.6 (H) 11/14/2015   HGB 13.7 11/14/2015   HCT 39.9 11/14/2015   PLT 243.0 11/14/2015   GLUCOSE 96 08/19/2015   CHOL 166 08/14/2015   TRIG 69.0 08/14/2015   HDL 54.90 08/14/2015   LDLCALC 97 08/14/2015   ALT 13 08/14/2015   AST 14 08/14/2015   NA 139 08/19/2015   K 4.5 08/19/2015   CL 108 08/19/2015   CREATININE 0.77 08/19/2015   BUN 13 08/19/2015   CO2 27 08/19/2015   TSH 1.30 08/14/2015   HGBA1C 5.5 08/29/2014    Dg Lumbar Spine Complete  Result Date: 12/22/2015 CLINICAL DATA:  One week of constant low back pain. No mention of injury. EXAM: LUMBAR SPINE - COMPLETE 4+ VIEW COMPARISON:  None in PACs FINDINGS: The lumbar vertebral bodies are preserved in height. The pedicles and transverse processes are intact. The disc space heights are well maintained with exception of L5-S1 where there is moderate narrowing. There is no spondylolisthesis. There is mild facet joint hypertrophy at L5-S1. The observed portions of the sacrum are normal. Essure contraceptive devices are present in the pelvis. IMPRESSION: Degenerative disc space narrowing and facet joint hypertrophy at L5-S1. No compression fracture nor other acute lumbar spine abnormality. Electronically Signed   By: David  [place] M.D.   On: 12/22/2015 15:11    Assessment & Plan:   [name] was seen today for conjunctivitis.  Diagnoses and all orders for this visit:  Acute viral conjunctivitis of both eyes -     tobramycin-dexamethasone (TOBRADEX) ophthalmic solution;  Place 2 drops into both eyes every 6 (six) hours.   I am having Christina [name] start on tobramycin-dexamethasone. I am also having her maintain her vitamin B-12, Omega-3 Fatty Acids (OMEGA 3 PO), meloxicam, cyclobenzaprine, buPROPion, varenicline, phentermine, DULoxetine, Doxepin HCl, predniSONE, and diazepam.  Meds ordered this encounter  Medications  . tobramycin-dexamethasone (TOBRADEX) ophthalmic solution    Sig: Place 2 drops into both eyes every 6 (six) hours.    Dispense:  5 mL    Refill:  0     Follow-up: Return if symptoms worsen or fail to improve.  Sanda Linger, MD

## 2016-05-24 NOTE — Patient Instructions (Signed)
Viral Conjunctivitis, Adult Viral conjunctivitis is an inflammation of the clear membrane that covers the white part of your eye and the inner surface of your eyelid (conjunctiva). The inflammation is caused by a viral infection. The blood vessels in the conjunctiva become inflamed, causing the eye to become red or pink, and often itchy. Viral conjunctivitis can be easily passed from one person to another (is contagious). This condition is often called pink eye. What are the causes? This condition is caused by a virus. A virus is a type of contagious germ. It can be spread by touching objects that have been contaminated with the virus, such as doorknobs or towels. It can also be passed through droplets, such as from coughing or sneezing. What are the signs or symptoms? Symptoms of this condition include:  Eye redness.  Tearing or watery eyes.  Itchy and irritated eyes.  Burning feeling in the eyes.  Clear drainage from the eye.  Swollen eyelids.  A gritty feeling in the eye.  Light sensitivity. This condition often occurs with other symptoms, such as a fever, nausea, or a rash. How is this diagnosed? This condition is diagnosed with a medical history and physical exam. If you have discharge from your eye, the discharge may be tested to rule out other causes of conjunctivitis. How is this treated? Viral conjunctivitis does not respond to medicines that kill bacteria (antibiotics). Treatment for viral conjunctivitis is directed at stopping a bacterial infection from developing in addition to the viral infection. Treatment also aims to relieve your symptoms, such as itching. This may be done with antihistamine drops or other eye medicines. Rarely, steroid eye drops or antiviral medicines may be prescribed. Follow these instructions at home: Medicines    Take or apply over-the-counter and prescription medicines only as told by your health care provider.  Be very careful to avoid touching  the edge of the eyelid with the eye drop bottle or ointment tube when applying medicines to the affected eye. Being careful this way will stop you from spreading the infection to the other eye or to other people. Eye care   Avoid touching or rubbing your eyes.  Apply a warm, wet, clean washcloth to your eye for 10-20 minutes, 3-4 times per day or as told by your health care provider.  If you wear contact lenses, do not wear them until the inflammation is gone and your health care provider says it is safe to wear them again. Ask your health care provider how to sterilize or replace your contact lenses before using them again. Wear glasses until you can resume wearing contacts.  Avoid wearing eye makeup until the inflammation is gone. Throw away any old eye cosmetics that may be contaminated.  Gently wipe away any drainage from your eye with a warm, wet washcloth or a cotton ball. General instructions   Change or wash your pillowcase every day or as told by your health care provider.  Do not share towels, pillowcases, washcloths, eye makeup, makeup brushes, contact lenses, or glasses. This may spread the infection.  Wash your hands often with soap and water. Use paper towels to dry your hands. If soap and water are not available, use hand sanitizer.  Try to avoid contact with other people for one week or as told by your health care provider. Contact a health care provider if:  Your symptoms do not improve with treatment or they get worse.  You have increased pain.  Your vision becomes blurry.  You   have a fever.  You have facial pain, redness, or swelling.  You have yellow or green drainage coming from your eye.  You have new symptoms. This information is not intended to replace advice given to you by your health care provider. Make sure you discuss any questions you have with your health care provider. Document Released: 05/29/2002 Document Revised: 10/04/2015 Document Reviewed:  09/23/2015 Elsevier Interactive Patient Education  2017 [employer]  

## 2016-05-24 NOTE — Progress Notes (Signed)
Pre-visit discussion using our clinic review tool. No additional management support is needed unless otherwise documented below in the visit note.  

## 2016-07-05 ENCOUNTER — Other Ambulatory Visit: Payer: Self-pay | Admitting: Internal Medicine

## 2016-07-05 ENCOUNTER — Telehealth: Payer: Self-pay

## 2016-07-05 DIAGNOSIS — F450 Somatization disorder: Principal | ICD-10-CM

## 2016-07-05 DIAGNOSIS — F329 Major depressive disorder, single episode, unspecified: Secondary | ICD-10-CM

## 2016-07-05 DIAGNOSIS — F32A Depression, unspecified: Secondary | ICD-10-CM

## 2016-07-05 MED ORDER — DULOXETINE HCL 60 MG PO CPEP: 60.0000 mg | ORAL_CAPSULE | Freq: Every day | ORAL | 3 refills | Status: DC

## 2016-07-05 NOTE — Telephone Encounter (Signed)
Patient requesting to move up to  Cymbalta as stated in LOV

## 2016-08-19 ENCOUNTER — Other Ambulatory Visit: Payer: Self-pay

## 2016-08-19 DIAGNOSIS — F329 Major depressive disorder, single episode, unspecified: Secondary | ICD-10-CM

## 2016-08-19 DIAGNOSIS — F450 Somatization disorder: Principal | ICD-10-CM

## 2016-08-19 DIAGNOSIS — F32A Depression, unspecified: Secondary | ICD-10-CM

## 2016-08-19 MED ORDER — DULOXETINE HCL 60 MG PO CPEP: 60.0000 mg | ORAL_CAPSULE | Freq: Every day | ORAL | 3 refills | Status: DC

## 2016-10-12 ENCOUNTER — Encounter: Payer: Self-pay | Admitting: Internal Medicine

## 2016-10-12 ENCOUNTER — Other Ambulatory Visit (INDEPENDENT_AMBULATORY_CARE_PROVIDER_SITE_OTHER): Payer: 59

## 2016-10-12 ENCOUNTER — Ambulatory Visit (INDEPENDENT_AMBULATORY_CARE_PROVIDER_SITE_OTHER): Payer: 59 | Admitting: Internal Medicine

## 2016-10-12 VITALS — BP 124/76 | HR 65 | Temp 98.3°F | Resp 12 | Ht 65.0 in | Wt 198.0 lb

## 2016-10-12 DIAGNOSIS — Z0000 Encounter for general adult medical examination without abnormal findings: Secondary | ICD-10-CM

## 2016-10-12 LAB — TSH: TSH: 1.27 u[IU]/mL (ref 0.35–4.50)

## 2016-10-12 LAB — CBC WITH DIFFERENTIAL/PLATELET
BASOS PCT: 0.5 % (ref 0.0–3.0)
Basophils Absolute: 0 10*3/uL (ref 0.0–0.1)
EOS ABS: 0.3 10*3/uL (ref 0.0–0.7)
Eosinophils Relative: 3.6 % (ref 0.0–5.0)
HCT: 42.3 % (ref 36.0–46.0)
HEMOGLOBIN: 13.9 g/dL (ref 12.0–15.0)
LYMPHS ABS: 1.7 10*3/uL (ref 0.7–4.0)
Lymphocytes Relative: 22.9 % (ref 12.0–46.0)
MCHC: 32.9 g/dL (ref 30.0–36.0)
MCV: 91.5 fl (ref 78.0–100.0)
MONO ABS: 0.5 10*3/uL (ref 0.1–1.0)
Monocytes Relative: 7.1 % (ref 3.0–12.0)
NEUTROS ABS: 4.7 10*3/uL (ref 1.4–7.7)
NEUTROS PCT: 65.9 % (ref 43.0–77.0)
PLATELETS: 221 10*3/uL (ref 150.0–400.0)
RBC: 4.62 Mil/uL (ref 3.87–5.11)
RDW: 13.4 % (ref 11.5–15.5)
WBC: 7.2 10*3/uL (ref 4.0–10.5)

## 2016-10-12 LAB — COMPREHENSIVE METABOLIC PANEL
ALBUMIN: 4.4 g/dL (ref 3.5–5.2)
ALT: 11 U/L (ref 0–35)
AST: 15 U/L (ref 0–37)
Alkaline Phosphatase: 43 U/L (ref 39–117)
BUN: 9 mg/dL (ref 6–23)
CHLORIDE: 106 meq/L (ref 96–112)
CO2: 28 meq/L (ref 19–32)
CREATININE: 0.83 mg/dL (ref 0.40–1.20)
Calcium: 9.3 mg/dL (ref 8.4–10.5)
GFR: 82.8 mL/min (ref >60.00)
Glucose, Bld: 101 mg/dL — ABNORMAL HIGH (ref 70–99)
Potassium: 4.9 mEq/L (ref 3.5–5.1)
Sodium: 141 mEq/L (ref 135–145)
Total Bilirubin: 0.4 mg/dL (ref 0.2–1.2)
Total Protein: 6.8 g/dL (ref 6.0–8.3)

## 2016-10-12 LAB — LIPID PANEL
CHOL/HDL RATIO: 3
CHOLESTEROL: 160 mg/dL (ref 0–200)
HDL: 51.5 mg/dL (ref >39.00)
LDL CALC: 97 mg/dL (ref 0–99)
NonHDL: 108.19
Triglycerides: 58 mg/dL (ref 0.0–149.0)
VLDL: 11.6 mg/dL (ref 0.0–40.0)

## 2016-10-12 NOTE — Progress Notes (Signed)
Subjective:  Patient ID: Christina [name], female    DOB: 08-05-1980  Age: [age] y.o. MRN: [id number]  CC: Annual Exam   HPI Christina [name] presents for a CPX - she feels well and offers no new complaints. She has chronic low back pain that has not recently worsened. She tells me the depression and insomnia are being well controlled with the combination of bupropion, doxepin, and duloxetine.  Outpatient Medications Prior to Visit  Medication Sig Dispense Refill  . buPROPion (WELLBUTRIN XL) 150 MG 24 hr tablet Take 1 tablet (150 mg total) by mouth daily. 90 tablet 1  . Doxepin HCl (SILENOR) 6 MG TABS Take 1 tablet (6 mg total) by mouth at bedtime as needed. 30 tablet 11  . DULoxetine (CYMBALTA) 60 MG capsule Take 1 capsule (60 mg total) by mouth daily. 90 capsule 3  . cyclobenzaprine (FLEXERIL) 10 MG tablet   0  . meloxicam (MOBIC) 15 MG tablet Take 1 tablet (15 mg total) by mouth daily. (Patient not taking: Reported on 10/12/2016) 90 tablet 1  . Omega-3 Fatty Acids (OMEGA 3 PO) Take by mouth.    . vitamin B-12 (CYANOCOBALAMIN) 1000 MCG tablet Take 1,000 mcg by mouth daily.     No facility-administered medications prior to visit.     ROS Review of Systems  Constitutional: Negative.  Negative for diaphoresis and fatigue.  HENT: Negative.   Eyes: Negative.   Respiratory: Negative.  Negative for cough, chest tightness and shortness of breath.   Cardiovascular: Negative.  Negative for chest pain, palpitations and leg swelling.  Gastrointestinal: Negative for abdominal pain, constipation, diarrhea, nausea and vomiting.  Endocrine: Negative.   Genitourinary: Negative.   Musculoskeletal: Positive for back pain.  Skin: Negative.   Hematological: Negative.   Psychiatric/Behavioral: Negative.  Negative for behavioral problems, decreased concentration, dysphoric mood and suicidal ideas. The patient is not nervous/anxious.     Objective:  BP 124/76 (BP  Location: Left Arm, Patient Position: Sitting, Cuff Size: Normal)   Pulse 65   Temp 98.3 F (36.8 C) (Oral)   Resp 12   Ht 5\' 5"  (1.651 m)   Wt 198 lb (89.8 kg)   LMP 10/09/2016 (Exact Date)   SpO2 99%   BMI 32.95 kg/m   BP Readings from Last 3 Encounters:  10/12/16 124/76  05/24/16 112/72  04/20/16 110/72    Wt Readings from Last 3 Encounters:  10/12/16 198 lb (89.8 kg)  04/20/16 189 lb (85.7 kg)  12/29/15 181 lb (82.1 kg)    Physical Exam  Constitutional: She is oriented to person, place, and time. No distress.  HENT:  Mouth/Throat: Oropharynx is clear and moist. No oropharyngeal exudate.  Eyes: Conjunctivae are normal. Right eye exhibits no discharge. Left eye exhibits no discharge. No scleral icterus.  Neck: Normal range of motion. Neck supple. No JVD present. No thyromegaly present.  Cardiovascular: Normal rate, regular rhythm and intact distal pulses.  Exam reveals no gallop and no friction rub.   No murmur heard. Pulmonary/Chest: Effort normal and breath sounds normal. No respiratory distress. She has no wheezes. She has no rales. She exhibits no tenderness.  Abdominal: Soft. Bowel sounds are normal. She exhibits no distension and no mass. There is no tenderness. There is no rebound and no guarding.  Musculoskeletal: Normal range of motion. She exhibits no edema, tenderness or deformity.  Lymphadenopathy:    She has no cervical adenopathy.  Neurological: She is alert and oriented to person, place, and  time.  Skin: Skin is warm and dry. No rash noted. She is not diaphoretic. No erythema. No pallor.  Psychiatric: She has a normal mood and affect. Her behavior is normal. Judgment and thought content normal.  Vitals reviewed.   Lab Results  Component Value Date   WBC 7.2 10/12/2016   HGB 13.9 10/12/2016   HCT 42.3 10/12/2016   PLT 221.0 10/12/2016   GLUCOSE 101 (H) 10/12/2016   CHOL 160 10/12/2016   TRIG 58.0 10/12/2016   HDL 51.50 10/12/2016   LDLCALC 97  10/12/2016   ALT 11 10/12/2016   AST 15 10/12/2016   NA 141 10/12/2016   K 4.9 10/12/2016   CL 106 10/12/2016   CREATININE 0.83 10/12/2016   BUN 9 10/12/2016   CO2 28 10/12/2016   TSH 1.27 10/12/2016   HGBA1C 5.5 08/29/2014    Dg Lumbar Spine Complete  Result Date: 12/22/2015 CLINICAL DATA:  One week of constant low back pain. No mention of injury. EXAM: LUMBAR SPINE - COMPLETE 4+ VIEW COMPARISON:  None in PACs FINDINGS: The lumbar vertebral bodies are preserved in height. The pedicles and transverse processes are intact. The disc space heights are well maintained with exception of L5-S1 where there is moderate narrowing. There is no spondylolisthesis. There is mild facet joint hypertrophy at L5-S1. The observed portions of the sacrum are normal. Essure contraceptive devices are present in the pelvis. IMPRESSION: Degenerative disc space narrowing and facet joint hypertrophy at L5-S1. No compression fracture nor other acute lumbar spine abnormality. Electronically Signed   By: David  [place] M.D.   On: 12/22/2015 15:11    Assessment & Plan:   Christina [name] was seen today for annual exam.  Diagnoses and all orders for this visit:  Routine general medical examination at a health care facility- Exam completed, labs reviewed, vaccines reviewed, her Pap is up-to-date, patient education materials were given. -     Lipid panel; Future -     Comprehensive metabolic panel; Future -     CBC with Differential/Platelet; Future -     TSH; Future   I am having Christina [name] maintain her vitamin B-12, Omega-3 Fatty Acids (OMEGA 3 PO), meloxicam, cyclobenzaprine, buPROPion, Doxepin HCl, DULoxetine, and varenicline.  Meds ordered this encounter  Medications  . varenicline (CHANTIX) 1 MG tablet    Sig: Take 1 mg by mouth 2 (two) times daily.     Follow-up: Return if symptoms worsen or fail to improve.  Sanda Linger, MD

## 2016-10-12 NOTE — Patient Instructions (Signed)
Preventive Care 18-39 Years, Female Preventive care refers to lifestyle choices and visits with your health care provider that can promote health and wellness. What does preventive care include?  A yearly physical exam. This is also called an annual well check.  Dental exams once or twice a year.  Routine eye exams. Ask your health care provider how often you should have your eyes checked.  Personal lifestyle choices, including: ? Daily care of your teeth and gums. ? Regular physical activity. ? Eating a healthy diet. ? Avoiding tobacco and drug use. ? Limiting alcohol use. ? Practicing safe sex. ? Taking vitamin and mineral supplements as recommended by your health care provider. What happens during an annual well check? The services and screenings done by your health care provider during your annual well check will depend on your age, overall health, lifestyle risk factors, and family history of disease. Counseling Your health care provider may ask you questions about your:  Alcohol use.  Tobacco use.  Drug use.  Emotional well-being.  Home and relationship well-being.  Sexual activity.  Eating habits.  Work and work Statistician.  Method of birth control.  Menstrual cycle.  Pregnancy history.  Screening You may have the following tests or measurements:  Height, weight, and BMI.  Diabetes screening. This is done by checking your blood sugar (glucose) after you have not eaten for a while (fasting).  Blood pressure.  Lipid and cholesterol levels. These may be checked every 5 years starting at age 66.  Skin check.  Hepatitis C blood test.  Hepatitis B blood test.  Sexually transmitted disease (STD) testing.  BRCA-related cancer screening. This may be done if you have a family history of breast, ovarian, tubal, or peritoneal cancers.  Pelvic exam and Pap test. This may be done every 3 years starting at age 40. Starting at age 59, this may be done every 5  years if you have a Pap test in combination with an HPV test.  Discuss your test results, treatment options, and if necessary, the need for more tests with your health care provider. Vaccines Your health care provider may recommend certain vaccines, such as:  Influenza vaccine. This is recommended every year.  Tetanus, diphtheria, and acellular pertussis (Tdap, Td) vaccine. You may need a Td booster every 10 years.  Varicella vaccine. You may need this if you have not been vaccinated.  HPV vaccine. If you are 69 or younger, you may need three doses over 6 months.  Measles, mumps, and rubella (MMR) vaccine. You may need at least one dose of MMR. You may also need a second dose.  Pneumococcal 13-valent conjugate (PCV13) vaccine. You may need this if you have certain conditions and were not previously vaccinated.  Pneumococcal polysaccharide (PPSV23) vaccine. You may need one or two doses if you smoke cigarettes or if you have certain conditions.  Meningococcal vaccine. One dose is recommended if you are age 27-21 years and a first-year college student living in a residence hall, or if you have one of several medical conditions. You may also need additional booster doses.  Hepatitis A vaccine. You may need this if you have certain conditions or if you travel or work in places where you may be exposed to hepatitis A.  Hepatitis B vaccine. You may need this if you have certain conditions or if you travel or work in places where you may be exposed to hepatitis B.  Haemophilus influenzae type b (Hib) vaccine. You may need this if  you have certain risk factors.  Talk to your health care provider about which screenings and vaccines you need and how often you need them. This information is not intended to replace advice given to you by your health care provider. Make sure you discuss any questions you have with your health care provider. Document Released: 05/04/2001 Document Revised: 11/26/2015  Document Reviewed: 01/07/2015 Elsevier Interactive Patient Education  2017 [employer].

## 2016-10-29 DIAGNOSIS — N943 Premenstrual tension syndrome: Secondary | ICD-10-CM | POA: Diagnosis not present

## 2016-10-29 DIAGNOSIS — Z1389 Encounter for screening for other disorder: Secondary | ICD-10-CM | POA: Diagnosis not present

## 2016-10-29 DIAGNOSIS — Z01419 Encounter for gynecological examination (general) (routine) without abnormal findings: Secondary | ICD-10-CM | POA: Diagnosis not present

## 2016-10-29 DIAGNOSIS — Z130 Encounter for screening for diseases of the blood and blood-forming organs and certain disorders involving the immune mechanism: Secondary | ICD-10-CM | POA: Diagnosis not present

## 2016-12-02 ENCOUNTER — Telehealth: Payer: Self-pay

## 2016-12-02 MED ORDER — CYCLOBENZAPRINE HCL 10 MG PO TABS: 10.0000 mg | ORAL_TABLET | Freq: Three times a day (TID) | ORAL | 1 refills | Status: DC | PRN

## 2016-12-02 NOTE — Telephone Encounter (Signed)
done

## 2016-12-02 NOTE — Telephone Encounter (Signed)
Pt rq rf flexeril

## 2017-03-17 ENCOUNTER — Telehealth: Payer: Self-pay | Admitting: *Deleted

## 2017-03-17 NOTE — Telephone Encounter (Signed)
pls give her depo-medrol 120 mg IM

## 2017-03-17 NOTE — Telephone Encounter (Signed)
Patient has a productive cough for four days. Admits night sweats, low grade fevers, and congestion.

## 2017-05-02 ENCOUNTER — Ambulatory Visit: Payer: Self-pay | Admitting: Family Medicine

## 2017-05-26 ENCOUNTER — Encounter: Payer: Self-pay | Admitting: Internal Medicine

## 2017-05-26 ENCOUNTER — Ambulatory Visit (INDEPENDENT_AMBULATORY_CARE_PROVIDER_SITE_OTHER): Payer: 59 | Admitting: Internal Medicine

## 2017-05-26 VITALS — BP 124/78 | HR 82 | Temp 98.0°F | Resp 16 | Ht 65.0 in | Wt 200.1 lb

## 2017-05-26 DIAGNOSIS — I83813 Varicose veins of bilateral lower extremities with pain: Secondary | ICD-10-CM

## 2017-05-26 MED ORDER — VASCULERA PO TABS: 1.0000 | ORAL_TABLET | Freq: Every day | ORAL | 11 refills | Status: DC

## 2017-05-26 NOTE — Progress Notes (Signed)
Subjective:  Patient ID: Christina [name], female    DOB: 09-11-1980  Age: 37 y.o. MRN: [id number]  CC: Varicose Veins   HPI [name] presents for concerns about worsening and symptomatic varicose veins over both posterior calves, more on the right than the left.  Outpatient Medications Prior to Visit  Medication Sig Dispense Refill  . buPROPion (WELLBUTRIN XL) 150 MG 24 hr tablet Take 1 tablet (150 mg total) by mouth daily. 90 tablet 1  . DULoxetine (CYMBALTA) 60 MG capsule Take 1 capsule (60 mg total) by mouth daily. 90 capsule 3  . cyclobenzaprine (FLEXERIL) 10 MG tablet Take 1 tablet (10 mg total) by mouth 3 (three) times daily as needed for muscle spasms. 90 tablet 1  . Doxepin HCl (SILENOR) 6 MG TABS Take 1 tablet (6 mg total) by mouth at bedtime as needed. 30 tablet 11  . meloxicam (MOBIC) 15 MG tablet Take 1 tablet (15 mg total) by mouth daily. (Patient not taking: Reported on 10/12/2016) 90 tablet 1  . Omega-3 Fatty Acids (OMEGA 3 PO) Take by mouth.    . varenicline (CHANTIX) 1 MG tablet Take 1 mg by mouth 2 (two) times daily.    . vitamin B-12 (CYANOCOBALAMIN) 1000 MCG tablet Take 1,000 mcg by mouth daily.     No facility-administered medications prior to visit.     ROS Review of Systems  Constitutional: Negative.   HENT: Negative.   Respiratory: Negative for cough, chest tightness and shortness of breath.   Cardiovascular: Negative for leg swelling.  Gastrointestinal: Negative for abdominal pain.  Endocrine: Negative.   Genitourinary: Negative.   Musculoskeletal: Negative.   Skin: Negative.  Negative for color change, pallor and rash.  Neurological: Negative.   Hematological: Negative for adenopathy. Does not bruise/bleed easily.  Psychiatric/Behavioral: Negative.     Objective:  BP 124/78 (BP Location: Left Arm, Patient Position: Sitting, Cuff Size: Normal)   Pulse 82   Temp 98 F (36.7 C) (Oral)   Ht 5\' 5"  (1.651 m)    Wt 200 lb 1.9 oz (90.8 kg)   SpO2 99%   BMI 33.30 kg/m   BP Readings from Last 3 Encounters:  05/26/17 124/78  10/12/16 124/76  05/24/16 112/72    Wt Readings from Last 3 Encounters:  05/26/17 200 lb 1.9 oz (90.8 kg)  10/12/16 198 lb (89.8 kg)  04/20/16 189 lb (85.7 kg)    Physical Exam  Musculoskeletal:       Legs:   Lab Results  Component Value Date   WBC 7.2 10/12/2016   HGB 13.9 10/12/2016   HCT 42.3 10/12/2016   PLT 221.0 10/12/2016   GLUCOSE 101 (H) 10/12/2016   CHOL 160 10/12/2016   TRIG 58.0 10/12/2016   HDL 51.50 10/12/2016   LDLCALC 97 10/12/2016   ALT 11 10/12/2016   AST 15 10/12/2016   NA 141 10/12/2016   K 4.9 10/12/2016   CL 106 10/12/2016   CREATININE 0.83 10/12/2016   BUN 9 10/12/2016   CO2 28 10/12/2016   TSH 1.27 10/12/2016   HGBA1C 5.5 08/29/2014    Dg Lumbar Spine Complete  Result Date: 12/22/2015 CLINICAL DATA:  One week of constant low back pain. No mention of injury. EXAM: LUMBAR SPINE - COMPLETE 4+ VIEW COMPARISON:  None in PACs FINDINGS: The lumbar vertebral bodies are preserved in height. The pedicles and transverse processes are intact. The disc space heights are well maintained with exception of L5-S1 where there is  moderate narrowing. There is no spondylolisthesis. There is mild facet joint hypertrophy at L5-S1. The observed portions of the sacrum are normal. Essure contraceptive devices are present in the pelvis. IMPRESSION: Degenerative disc space narrowing and facet joint hypertrophy at L5-S1. No compression fracture nor other acute lumbar spine abnormality. Electronically Signed   By: David  SwazilandJordan M.D.   On: 12/22/2015 15:11    Assessment & Plan:   [name] was seen today for varicose veins.  Diagnoses and all orders for this visit:  Varicose veins of both lower extremities with pain- she will start taking vasculera for sx relief and to reduce the risk of complications like ulceration, she will also see vascular surgery to  consider additional treatment options. -     Dietary Management Product (VASCULERA) TABS; Take 1 capsule by mouth daily. -     Ambulatory referral to Vascular Surgery   I have discontinued Kohana M. [facility] Davidson's vitamin B-12, Omega-3 Fatty Acids (OMEGA 3 PO), meloxicam, Doxepin HCl, varenicline, and cyclobenzaprine. I am also having her start on VASCULERA. Additionally, I am having her maintain her buPROPion and DULoxetine.  Meds ordered this encounter  Medications  . Dietary Management Product (VASCULERA) TABS    Sig: Take 1 capsule by mouth daily.    Dispense:  30 tablet    Refill:  11     Follow-up: Return if symptoms worsen or fail to improve.  Sanda Lingerhomas Dominyk Law, MD

## 2017-05-26 NOTE — Patient Instructions (Signed)
Varicose Veins Varicose veins are veins that have become enlarged and twisted. They are usually seen in the legs but can occur in other parts of the body as well. What are the causes? This condition is the result of valves in the veins not working properly. Valves in the veins help to return blood from the leg to the heart. If these valves are damaged, blood flows backward and backs up into the veins in the leg near the skin. This causes the veins to become larger. What increases the risk? People who are on their feet a lot, who are pregnant, or who are overweight are more likely to develop varicose veins. What are the signs or symptoms?  Bulging, twisted-appearing, bluish veins, most commonly found on the legs.  Leg pain or a feeling of heaviness. These symptoms may be worse at the end of the day.  Leg swelling.  Changes in skin color. How is this diagnosed? A health care provider can usually diagnose varicose veins by examining your legs. Your health care provider may also recommend an ultrasound of your leg veins. How is this treated? Most varicose veins can be treated at home.However, other treatments are available for people who have persistent symptoms or want to improve the cosmetic appearance of the varicose veins. These treatment options include:  Sclerotherapy. A solution is injected into the vein to close it off.  Laser treatment. A laser is used to heat the vein to close it off.  Radiofrequency vein ablation. An electrical current produced by radio waves is used to close off the vein.  Phlebectomy. The vein is surgically removed through small incisions made over the varicose vein.  Vein ligation and stripping. The vein is surgically removed through incisions made over the varicose vein after the vein has been tied (ligated). Follow these instructions at home:   Do not stand or sit in one position for long periods of time. Do not sit with your legs crossed. Rest with your  legs raised during the day.  Wear compression stockings as directed by your health care provider. These stockings help to prevent blood clots and reduce swelling in your legs.  Do not wear other tight, encircling garments around your legs, pelvis, or waist.  Walk as much as possible to increase blood flow.  Raise the foot of your bed at night with 2-inch blocks.  If you get a cut in the skin over the vein and the vein bleeds, lie down with your leg raised and press on it with a clean cloth until the bleeding stops. Then place a bandage (dressing) on the cut. See your health care provider if it continues to bleed. Contact a health care provider if:  The skin around your ankle starts to break down.  You have pain, redness, tenderness, or hard swelling in your leg over a vein.  You are uncomfortable because of leg pain. This information is not intended to replace advice given to you by your health care provider. Make sure you discuss any questions you have with your health care provider. Document Released: 12/16/2004 Document Revised: 08/14/2015 Document Reviewed: 09/09/2015 Elsevier Interactive Patient Education  2017 Elsevier Inc.  

## 2017-05-30 ENCOUNTER — Encounter: Payer: Self-pay | Admitting: Internal Medicine

## 2017-06-07 ENCOUNTER — Ambulatory Visit (INDEPENDENT_AMBULATORY_CARE_PROVIDER_SITE_OTHER): Payer: 59 | Admitting: Internal Medicine

## 2017-06-07 ENCOUNTER — Encounter: Payer: Self-pay | Admitting: Internal Medicine

## 2017-06-07 VITALS — BP 102/68 | HR 65 | Temp 98.0°F | Resp 16 | Wt 202.0 lb

## 2017-06-07 DIAGNOSIS — G43001 Migraine without aura, not intractable, with status migrainosus: Secondary | ICD-10-CM

## 2017-06-07 MED ORDER — SUMATRIPTAN SUCCINATE 100 MG PO TABS: 100.0000 mg | ORAL_TABLET | ORAL | 5 refills | Status: DC | PRN

## 2017-06-07 MED ORDER — KETOROLAC TROMETHAMINE 60 MG/2ML IM SOLN
60.0000 mg | Freq: Once | INTRAMUSCULAR | Status: AC
  Administered 2017-06-07: 60 mg via INTRAMUSCULAR

## 2017-06-07 MED FILL — SUMATRIPTAN SUCC 100 MG TAB: 100 | 10 days supply | Qty: 10 | Fill #0

## 2017-06-07 NOTE — Progress Notes (Signed)
Subjective:  Patient ID: Christina [name], female    DOB: 12/24/1980  Age: 37 y.o. MRN: [id number]  CC: Headache   HPI [facility] Christina [name] presents for a 2-day history of headache that she describes as a sharp/stabbing pain that shoots across her forehead.  She has had photophobia, phonophobia, dizziness and nausea but she denies vomiting.  This is not the worst headache she has ever had.  She has a history of migraines and says this feels like her usual migraine headache.  She previously responded well to sumatriptan and but has run out and needs a refill.  She has not taken anything for this headache in the last 2 days.  Outpatient Medications Prior to Visit  Medication Sig Dispense Refill  . buPROPion (WELLBUTRIN XL) 150 MG 24 hr tablet Take 1 tablet (150 mg total) by mouth daily. 90 tablet 1  . Dietary Management Product (VASCULERA) TABS Take 1 capsule by mouth daily. 30 tablet 11  . DULoxetine (CYMBALTA) 60 MG capsule Take 1 capsule (60 mg total) by mouth daily. 90 capsule 3   No facility-administered medications prior to visit.     ROS Review of Systems  Constitutional: Negative.  Negative for chills, diaphoresis, fatigue and fever.  HENT: Negative.  Negative for trouble swallowing.   Eyes: Positive for photophobia. Negative for pain and visual disturbance.  Respiratory: Negative for cough, chest tightness, shortness of breath and wheezing.   Cardiovascular: Negative for chest pain, palpitations and leg swelling.  Gastrointestinal: Positive for nausea. Negative for abdominal pain, constipation, diarrhea and vomiting.  Endocrine: Negative.   Genitourinary: Negative.   Musculoskeletal: Negative.  Negative for back pain, myalgias, neck pain and neck stiffness.  Skin: Negative for color change and rash.  Allergic/Immunologic: Negative.   Neurological: Positive for dizziness and headaches. Negative for syncope, facial asymmetry, weakness, light-headedness  and numbness.  Hematological: Negative for adenopathy. Does not bruise/bleed easily.  Psychiatric/Behavioral: Negative.     Objective:  BP 102/68   Pulse 65   Temp 98 F (36.7 C) (Oral)   Resp 16   Wt 202 lb (91.6 kg)   LMP 06/03/2017   SpO2 98%   BMI 33.61 kg/m   BP Readings from Last 3 Encounters:  06/07/17 102/68  05/26/17 124/78  10/12/16 124/76    Wt Readings from Last 3 Encounters:  06/07/17 202 lb (91.6 kg)  05/26/17 200 lb 1.9 oz (90.8 kg)  10/12/16 198 lb (89.8 kg)    Physical Exam  Constitutional: She is oriented to person, place, and time.  Non-toxic appearance. She does not have a sickly appearance. She does not appear ill. No distress.  HENT:  Head: Normocephalic and atraumatic.  Mouth/Throat: Oropharynx is clear and moist. No oropharyngeal exudate.  Eyes: Conjunctivae and EOM are normal. Pupils are equal, round, and reactive to light. Right eye exhibits no discharge. Left eye exhibits no discharge. No scleral icterus.  Neck: Normal range of motion. Neck supple. No JVD present. No thyromegaly present.  Cardiovascular: Normal rate, regular rhythm and normal heart sounds. Exam reveals no gallop and no friction rub.  No murmur heard. Pulmonary/Chest: Effort normal and breath sounds normal. No respiratory distress. She has no wheezes. She has no rales.  Abdominal: Soft. Bowel sounds are normal. She exhibits no distension and no mass. There is no tenderness. There is no guarding.  Musculoskeletal: Normal range of motion. She exhibits no edema, tenderness or deformity.  Lymphadenopathy:    She has no cervical adenopathy.  Neurological: She is alert and oriented to person, place, and time. She displays no atrophy, no tremor and normal reflexes. No cranial nerve deficit or sensory deficit. She exhibits normal muscle tone. Coordination and gait normal. She displays no Babinski's sign on the right side. She displays no Babinski's sign on the left side.  Reflex Scores:       Tricep reflexes are 1+ on the right side and 1+ on the left side.      Bicep reflexes are 1+ on the right side and 1+ on the left side.      Brachioradialis reflexes are 1+ on the right side and 1+ on the left side.      Patellar reflexes are 1+ on the right side and 1+ on the left side.      Achilles reflexes are 1+ on the right side and 1+ on the left side. Skin: Skin is warm and dry. No rash noted. She is not diaphoretic. No erythema. No pallor.  Psychiatric: She has a normal mood and affect. Her behavior is normal. Judgment and thought content normal.  Vitals reviewed.   Lab Results  Component Value Date   WBC 7.2 10/12/2016   HGB 13.9 10/12/2016   HCT 42.3 10/12/2016   PLT 221.0 10/12/2016   GLUCOSE 101 (H) 10/12/2016   CHOL 160 10/12/2016   TRIG 58.0 10/12/2016   HDL 51.50 10/12/2016   LDLCALC 97 10/12/2016   ALT 11 10/12/2016   AST 15 10/12/2016   NA 141 10/12/2016   K 4.9 10/12/2016   CL 106 10/12/2016   CREATININE 0.83 10/12/2016   BUN 9 10/12/2016   CO2 28 10/12/2016   TSH 1.27 10/12/2016   HGBA1C 5.5 08/29/2014    Dg Lumbar Spine Complete  Result Date: 12/22/2015 CLINICAL DATA:  One week of constant low back pain. No mention of injury. EXAM: LUMBAR SPINE - COMPLETE 4+ VIEW COMPARISON:  None in PACs FINDINGS: The lumbar vertebral bodies are preserved in height. The pedicles and transverse processes are intact. The disc space heights are well maintained with exception of L5-S1 where there is moderate narrowing. There is no spondylolisthesis. There is mild facet joint hypertrophy at L5-S1. The observed portions of the sacrum are normal. Essure contraceptive devices are present in the pelvis. IMPRESSION: Degenerative disc space narrowing and facet joint hypertrophy at L5-S1. No compression fracture nor other acute lumbar spine abnormality. Electronically Signed   By: David  [place] M.D.   On: 12/22/2015 15:11    Assessment & Plan:   Christina [name] was seen today for  headache.  Diagnoses and all orders for this visit:  Migraine without aura and with status migrainosus, not intractable- She describes her usual migraine headache and she is neurologically intact.  Will treat with an injection of Toradol in the office and will restart sumatriptan.  She will let me know if she develops any new or worsening symptoms. -     SUMAtriptan (IMITREX) 100 MG tablet; Take 1 tablet (100 mg total) by mouth every 2 (two) hours as needed for migraine. May repeat in 2 hours if headache persists or recurs. -     ketorolac (TORADOL) injection 60 mg   I am having Christina [name] Christina [name] start on SUMAtriptan. I am also having her maintain her buPROPion, DULoxetine, and VASCULERA. We administered ketorolac.  Meds ordered this encounter  Medications  . SUMAtriptan (IMITREX) 100 MG tablet    Sig: Take 1 tablet (100 mg total) by mouth every 2 (two)  hours as needed for migraine. May repeat in 2 hours if headache persists or recurs.    Dispense:  10 tablet    Refill:  5  . ketorolac (TORADOL) injection 60 mg     Follow-up: Return if symptoms worsen or fail to improve.  Sanda Lingerhomas Melville Engen, MD

## 2017-06-07 NOTE — Patient Instructions (Signed)
Migraine Headache A migraine headache is an intense, throbbing pain on one side or both sides of the head. Migraines may also cause other symptoms, such as nausea, vomiting, and sensitivity to light and noise. What are the causes? Doing or taking certain things may also trigger migraines, such as:  Alcohol.  Smoking.  Medicines, such as: ? Medicine used to treat chest pain (nitroglycerine). ? Birth control pills. ? Estrogen pills. ? Certain blood pressure medicines.  Aged cheeses, chocolate, or caffeine.  Foods or drinks that contain nitrates, glutamate, aspartame, or tyramine.  Physical activity.  Other things that may trigger a migraine include:  Menstruation.  Pregnancy.  Hunger.  Stress, lack of sleep, too much sleep, or fatigue.  Weather changes.  What increases the risk? The following factors may make you more likely to experience migraine headaches:  Age. Risk increases with age.  Family history of migraine headaches.  Being Caucasian.  Depression and anxiety.  Obesity.  Being a woman.  Having a hole in the heart (patent foramen ovale) or other heart problems.  What are the signs or symptoms? The main symptom of this condition is pulsating or throbbing pain. Pain may:  Happen in any area of the head, such as on one side or both sides.  Interfere with daily activities.  Get worse with physical activity.  Get worse with exposure to bright lights or loud noises.  Other symptoms may include:  Nausea.  Vomiting.  Dizziness.  General sensitivity to bright lights, loud noises, or smells.  Before you get a migraine, you may get warning signs that a migraine is developing (aura). An aura may include:  Seeing flashing lights or having blind spots.  Seeing bright spots, halos, or zigzag lines.  Having tunnel vision or blurred vision.  Having numbness or a tingling feeling.  Having trouble talking.  Having muscle weakness.  How is this  diagnosed? A migraine headache can be diagnosed based on:  Your symptoms.  A physical exam.  Tests, such as CT scan or MRI of the head. These imaging tests can help rule out other causes of headaches.  Taking fluid from the spine (lumbar puncture) and analyzing it (cerebrospinal fluid analysis, or CSF analysis).  How is this treated? A migraine headache is usually treated with medicines that:  Relieve pain.  Relieve nausea.  Prevent migraines from coming back.  Treatment may also include:  Acupuncture.  Lifestyle changes like avoiding foods that trigger migraines.  Follow these instructions at home: Medicines  Take over-the-counter and prescription medicines only as told by your health care provider.  Do not drive or use heavy machinery while taking prescription pain medicine.  To prevent or treat constipation while you are taking prescription pain medicine, your health care provider may recommend that you: ? Drink enough fluid to keep your urine clear or pale yellow. ? Take over-the-counter or prescription medicines. ? Eat foods that are high in fiber, such as fresh fruits and vegetables, whole grains, and beans. ? Limit foods that are high in fat and processed sugars, such as fried and sweet foods. Lifestyle  Avoid alcohol use.  Do not use any products that contain nicotine or tobacco, such as cigarettes and e-cigarettes. If you need help quitting, ask your health care provider.  Get at least 8 hours of sleep every night.  Limit your stress. General instructions   Keep a journal to find out what may trigger your migraine headaches. For example, write down: ? What you eat and   drink. ? How much sleep you get. ? Any change to your diet or medicines.  If you have a migraine: ? Avoid things that make your symptoms worse, such as bright lights. ? It may help to lie down in a dark, quiet room. ? Do not drive or use heavy machinery. ? Ask your health care provider  what activities are safe for you while you are experiencing symptoms.  Keep all follow-up visits as told by your health care provider. This is important. Contact a health care provider if:  You develop symptoms that are different or more severe than your usual migraine symptoms. Get help right away if:  Your migraine becomes severe.  You have a fever.  You have a stiff neck.  You have vision loss.  Your muscles feel weak or like you cannot control them.  You start to lose your balance often.  You develop trouble walking.  You faint. This information is not intended to replace advice given to you by your health care provider. Make sure you discuss any questions you have with your health care provider. Document Released: 03/08/2005 Document Revised: 09/26/2015 Document Reviewed: 08/25/2015 Elsevier Interactive Patient Education  2017 Elsevier Inc.   

## 2017-06-07 NOTE — Addendum Note (Signed)
Addended by: Etta GrandchildJONES, Julie Paolini L on: 06/07/2017 05:30 PM   Modules accepted: Orders

## 2017-08-08 ENCOUNTER — Other Ambulatory Visit: Payer: Self-pay

## 2017-08-09 ENCOUNTER — Encounter: Payer: 59 | Admitting: Vascular Surgery

## 2017-08-17 ENCOUNTER — Ambulatory Visit (INDEPENDENT_AMBULATORY_CARE_PROVIDER_SITE_OTHER): Payer: 59 | Admitting: Internal Medicine

## 2017-08-17 ENCOUNTER — Encounter: Payer: Self-pay | Admitting: Internal Medicine

## 2017-08-17 VITALS — BP 106/68 | HR 57 | Temp 97.8°F | Ht 65.0 in | Wt 207.0 lb

## 2017-08-17 DIAGNOSIS — L309 Dermatitis, unspecified: Secondary | ICD-10-CM | POA: Insufficient documentation

## 2017-08-17 MED ORDER — DOXEPIN HCL 10 MG PO CAPS: 10.0000 mg | ORAL_CAPSULE | Freq: Every day | ORAL | 1 refills | Status: DC

## 2017-08-17 MED ORDER — FLUOCINONIDE 0.05 % EX CREA: 1.0000 "application " | TOPICAL_CREAM | Freq: Two times a day (BID) | CUTANEOUS | 2 refills | Status: DC

## 2017-08-17 MED FILL — FLUOCINONIDE 0.05% CREAM: 0.05 | 30 days supply | Qty: 60 | Fill #0

## 2017-08-17 MED FILL — DOXEPIN 10 MG CAPSULE: 10 | 90 days supply | Qty: 90 | Fill #0

## 2017-08-17 NOTE — Patient Instructions (Signed)

## 2017-08-17 NOTE — Progress Notes (Signed)
Subjective:  Patient ID: Christina [name], female    DOB: 06-Jan-1981  Age: 37 y.o. MRN: [id number]  CC: Rash   HPI Christina [name] presents for a recurring, pruritic rash on the back of her neck over the last year.  She been trying an over-the-counter topical to control the symptoms without much relief.  She wears a stethoscope but is unaware of any other exposures to the area.  Outpatient Medications Prior to Visit  Medication Sig Dispense Refill  . buPROPion (WELLBUTRIN XL) 150 MG 24 hr tablet Take 1 tablet (150 mg total) by mouth daily. 90 tablet 1  . Dietary Management Product (VASCULERA) TABS Take 1 capsule by mouth daily. 30 tablet 11  . DULoxetine (CYMBALTA) 60 MG capsule Take 1 capsule (60 mg total) by mouth daily. 90 capsule 3  . FLUoxetine (PROZAC) 10 MG tablet Take 10 mg by mouth daily.    . SUMAtriptan (IMITREX) 100 MG tablet Take 1 tablet (100 mg total) by mouth every 2 (two) hours as needed for migraine. May repeat in 2 hours if headache persists or recurs. 10 tablet 5   No facility-administered medications prior to visit.     ROS Review of Systems  Skin: Positive for rash. Negative for color change.  All other systems reviewed and are negative.   Objective:  BP 106/68 (BP Location: Left Arm, Patient Position: Sitting, Cuff Size: Large)   Pulse (!) 57   Temp 97.8 F (36.6 C) (Oral)   Ht  (1.651 m)   Wt 207 lb (93.9 kg)   LMP 08/08/2017   SpO2 99%   BMI 34.45 kg/m   BP Readings from Last 3 Encounters:  08/17/17 106/68  06/07/17 102/68  05/26/17 124/78    Wt Readings from Last 3 Encounters:  08/17/17 207 lb (93.9 kg)  06/07/17 202 lb (91.6 kg)  05/26/17 200 lb 1.9 oz (90.8 kg)    Physical Exam  Skin:       Lab Results  Component Value Date   WBC 7.2 10/12/2016   HGB 13.9 10/12/2016   HCT 42.3 10/12/2016   PLT 221.0 10/12/2016   GLUCOSE 101 (H) 10/12/2016   CHOL 160 10/12/2016   TRIG 58.0 10/12/2016   HDL 51.50 10/12/2016   LDLCALC 97 10/12/2016   ALT 11 10/12/2016   AST 15 10/12/2016   NA 141 10/12/2016   K 4.9 10/12/2016   CL 106 10/12/2016   CREATININE 0.83 10/12/2016   BUN 9 10/12/2016   CO2 28 10/12/2016   TSH 1.27 10/12/2016   HGBA1C 5.5 08/29/2014    Dg Lumbar Spine Complete  Result Date: 12/22/2015 CLINICAL DATA:  One week of constant low back pain. No mention of injury. EXAM: LUMBAR SPINE - COMPLETE 4+ VIEW COMPARISON:  None in PACs FINDINGS: The lumbar vertebral bodies are preserved in height. The pedicles and transverse processes are intact. The disc space heights are well maintained with exception of L5-S1 where there is moderate narrowing. There is no spondylolisthesis. There is mild facet joint hypertrophy at L5-S1. The observed portions of the sacrum are normal. Essure contraceptive devices are present in the pelvis. IMPRESSION: Degenerative disc space narrowing and facet joint hypertrophy at L5-S1. No compression fracture nor other acute lumbar spine abnormality. Electronically Signed   By: David  [place] M.D.   On: 12/22/2015 15:11    Assessment & Plan:   Christina [name] was seen today for rash.  Diagnoses and all orders for this visit:  Eczema,  unspecified type- The symptoms and rash are either contact dermatitis or eczema.  I have asked her to stop using the over-the-counter agent she is currently using and will upgrade to a moderately potent topical steroid and antihistamine. -     fluocinonide cream (LIDEX) 0.05 %; Apply 1 application topically 2 (two) times daily. -     doxepin (SINEQUAN) 10 MG capsule; Take 1 capsule (10 mg total) by mouth at bedtime.   I am having Christina [name] Christina [name] start on fluocinonide cream and doxepin. I am also having her maintain her buPROPion, DULoxetine, VASCULERA, SUMAtriptan, and FLUoxetine.  Meds ordered this encounter  Medications  . fluocinonide cream (LIDEX) 0.05 %    Sig: Apply 1 application topically 2 (two) times  daily.    Dispense:  60 g    Refill:  2  . doxepin (SINEQUAN) 10 MG capsule    Sig: Take 1 capsule (10 mg total) by mouth at bedtime.    Dispense:  90 capsule    Refill:  1     Follow-up: Return in about 3 weeks (around 09/07/2017).  Sanda Linger, MD

## 2017-09-13 ENCOUNTER — Ambulatory Visit (INDEPENDENT_AMBULATORY_CARE_PROVIDER_SITE_OTHER): Payer: 59 | Admitting: Vascular Surgery

## 2017-09-13 ENCOUNTER — Encounter: Payer: Self-pay | Admitting: Vascular Surgery

## 2017-09-13 ENCOUNTER — Other Ambulatory Visit: Payer: Self-pay

## 2017-09-13 VITALS — BP 102/70 | HR 72 | Temp 97.7°F | Resp 18 | Ht 65.0 in | Wt 208.4 lb

## 2017-09-13 DIAGNOSIS — I83891 Varicose veins of right lower extremities with other complications: Secondary | ICD-10-CM | POA: Diagnosis not present

## 2017-09-13 NOTE — Progress Notes (Signed)
Subjective:     Patient ID: Christina [name], female   DOB: [birth year], 37 y.o.   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 Yetta Barre for evaluation of painful varicosities in the right leg. She has noted prominent varix the medial calf on the right over the past 6 months. She describes an aching discomfort below the knee both medially and laterally. She has no history of DVT thrombophlebitis stasis ulcers or bleeding. She has not developed swelling is a progresses and does not elastic compression stockings. She has no symptoms and contralateral left leg.  Past Medical History:  Diagnosis Date  . Migraine   . Normal pregnancy 03/11/2012  . Ovarian cyst   . Postpartum hemorrhage   . SVD (spontaneous vaginal delivery) 03/11/2012    Social History   Tobacco Use  . Smoking status: Current Every Day Smoker    Packs/day: 0.50    Years: 20.00    Pack years: 10.00    Types: Cigarettes  . Smokeless tobacco: Never Used  Substance Use Topics  . Alcohol use: Yes    Alcohol/week: 0.0 oz    Comment: occassionally for social functions    Family History  Problem Relation Age of Onset  . Arthritis Father   . Sudden death Father 30       Sinus Cancer  . Cancer Father        nasal cancer  . Bipolar disorder Mother   . Sudden death Brother        GSW  . Arthritis Maternal Grandfather   . Hyperlipidemia Maternal Grandfather   . Heart disease Maternal Grandfather   . Breast cancer Maternal Grandmother   . Arthritis Maternal Grandmother   . Lung cancer Maternal Grandmother   . Heart disease Maternal Grandmother   . Hyperlipidemia Paternal Grandmother   . Stroke Paternal Grandfather     Allergies  Allergen Reactions  . Hydrocodone Itching  . Prednisone Other (See Comments)    Hyper, agitated, aggressive     Current Outpatient Medications:  .  buPROPion (WELLBUTRIN XL) 150 MG 24 hr tablet, Take 1 tablet (150 mg total)  by mouth daily., Disp: 90 tablet, Rfl: 1 .  Dietary Management Product (VASCULERA) TABS, Take 1 capsule by mouth daily., Disp: 30 tablet, Rfl: 11 .  doxepin (SINEQUAN) 10 MG capsule, Take 1 capsule (10 mg total) by mouth at bedtime., Disp: 90 capsule, Rfl: 1 .  DULoxetine (CYMBALTA) 60 MG capsule, Take 1 capsule (60 mg total) by mouth daily., Disp: 90 capsule, Rfl: 3 .  fluocinonide cream (LIDEX) 0.05 %, Apply 1 application topically 2 (two) times daily., Disp: 60 g, Rfl: 2 .  FLUoxetine (PROZAC) 10 MG tablet, Take 10 mg by mouth daily. Takes 10 days per month starting 1-2 days prior to menses., Disp: , Rfl:  .  SUMAtriptan (IMITREX) 100 MG tablet, Take 1 tablet (100 mg total) by mouth every 2 (two) hours as needed for migraine. May repeat in 2 hours if headache persists or recurs., Disp: 10 tablet, Rfl: 5  Vitals:   09/13/17 1330  BP: 102/70  Pulse: 72  Resp: 18  Temp: 97.7 F (36.5 C)  TempSrc: Oral  SpO2: 99%  Weight: 208 lb 6.4 oz (94.5 kg)  Height: 5\' 5"  (1.651 m)    Body mass index is 34.68 kg/m.         Review of Systems  Denies chest pain, dyspnea on exertion, PND, orthopnea, hemoptysis,  claudication    Objective:   Physical Exam BP 102/70 (BP Location: Left Arm, Patient Position: Sitting, Cuff Size: Large)   Pulse 72   Temp 97.7 F (36.5 C) (Oral)   Resp 18   Ht 5\' 5"  (1.651 m)   Wt 208 lb 6.4 oz (94.5 kg)   SpO2 99%   BMI 34.68 kg/m     Gen.-alert and oriented x3 in no apparent distress HEENT normal for age Lungs no rhonchi or wheezing Cardiovascular regular rhythm no murmurs carotid pulses 3+ palpable no bruits audible Abdomen soft nontender no palpable masses Musculoskeletal free of  major deformities Skin clear -no rashes Neurologic normal Lower extremities 3+ femoral and dorsalis pedis pulses palpable bilaterally with no edema Small varicosities are noted over the great saphenous vein beginning in the very distal thigh extending into the medial  calf. These are less than 4 mm in size. There are no spider veins noted. She has no hyperpigmentation or ulceration noted distally. Both feet are well-perfused. No varicosities noted on the left side.  Today I performed a bedside SonoSite ultrasound exam which revealed normal-size great and small saphenous vein in the right leg with no evidence of reflux       Assessment:     Small varicose veins right medial calf and distal thigh with no evidence of gross reflux in right great or small saphenous veins Intermittent discomfort right lower leg likely unrelated to above-noted varicosities    Plan:     Discussed with patient the treatment would consist of foam sclerotherapy if she should decide to desired this. We did give her contact information for [employer]. Otherwise she will return. On when necessary Basis

## 2017-10-12 ENCOUNTER — Other Ambulatory Visit: Payer: Self-pay | Admitting: Internal Medicine

## 2017-10-12 DIAGNOSIS — B309 Viral conjunctivitis, unspecified: Secondary | ICD-10-CM

## 2017-10-12 MED ORDER — NEOMYCIN-POLYMYXIN-HC 3.5-10000-1 OP SUSP: 3.0000 [drp] | Freq: Four times a day (QID) | OPHTHALMIC | 0 refills | Status: AC

## 2017-10-19 ENCOUNTER — Encounter: Payer: Self-pay | Admitting: Internal Medicine

## 2017-10-31 ENCOUNTER — Other Ambulatory Visit (INDEPENDENT_AMBULATORY_CARE_PROVIDER_SITE_OTHER): Payer: 59

## 2017-10-31 ENCOUNTER — Ambulatory Visit (INDEPENDENT_AMBULATORY_CARE_PROVIDER_SITE_OTHER): Payer: 59 | Admitting: Internal Medicine

## 2017-10-31 ENCOUNTER — Encounter: Payer: Self-pay | Admitting: Internal Medicine

## 2017-10-31 VITALS — BP 108/72 | HR 78 | Temp 98.5°F | Resp 16 | Ht 65.0 in | Wt 206.1 lb

## 2017-10-31 DIAGNOSIS — M5136 Other intervertebral disc degeneration, lumbar region: Secondary | ICD-10-CM

## 2017-10-31 DIAGNOSIS — F450 Somatization disorder: Secondary | ICD-10-CM

## 2017-10-31 DIAGNOSIS — F329 Major depressive disorder, single episode, unspecified: Secondary | ICD-10-CM

## 2017-10-31 DIAGNOSIS — Z0000 Encounter for general adult medical examination without abnormal findings: Secondary | ICD-10-CM | POA: Diagnosis not present

## 2017-10-31 DIAGNOSIS — F32A Depression, unspecified: Secondary | ICD-10-CM

## 2017-10-31 LAB — CBC WITH DIFFERENTIAL/PLATELET
BASOS ABS: 0.1 10*3/uL (ref 0.0–0.1)
Basophils Relative: 0.6 % (ref 0.0–3.0)
EOS PCT: 2.7 % (ref 0.0–5.0)
Eosinophils Absolute: 0.2 10*3/uL (ref 0.0–0.7)
HEMATOCRIT: 41.2 % (ref 36.0–46.0)
HEMOGLOBIN: 14 g/dL (ref 12.0–15.0)
LYMPHS PCT: 29.9 % (ref 12.0–46.0)
Lymphs Abs: 2.6 10*3/uL (ref 0.7–4.0)
MCHC: 34.1 g/dL (ref 30.0–36.0)
MCV: 90.5 fl (ref 78.0–100.0)
MONOS PCT: 6.7 % (ref 3.0–12.0)
Monocytes Absolute: 0.6 10*3/uL (ref 0.1–1.0)
Neutro Abs: 5.2 10*3/uL (ref 1.4–7.7)
Neutrophils Relative %: 60.1 % (ref 43.0–77.0)
Platelets: 244 10*3/uL (ref 150.0–400.0)
RBC: 4.55 Mil/uL (ref 3.87–5.11)
RDW: 13.3 % (ref 11.5–15.5)
WBC: 8.7 10*3/uL (ref 4.0–10.5)

## 2017-10-31 LAB — COMPREHENSIVE METABOLIC PANEL
ALBUMIN: 4.5 g/dL (ref 3.5–5.2)
ALK PHOS: 53 U/L (ref 39–117)
ALT: 10 U/L (ref 0–35)
AST: 15 U/L (ref 0–37)
BUN: 11 mg/dL (ref 6–23)
CHLORIDE: 103 meq/L (ref 96–112)
CO2: 30 mEq/L (ref 19–32)
Calcium: 9.5 mg/dL (ref 8.4–10.5)
Creatinine, Ser: 0.87 mg/dL (ref 0.40–1.20)
GFR: 77.96 mL/min (ref >60.00)
Glucose, Bld: 76 mg/dL (ref 70–99)
POTASSIUM: 3.7 meq/L (ref 3.5–5.1)
SODIUM: 140 meq/L (ref 135–145)
TOTAL PROTEIN: 7.4 g/dL (ref 6.0–8.3)
Total Bilirubin: 0.7 mg/dL (ref 0.2–1.2)

## 2017-10-31 LAB — LIPID PANEL
CHOLESTEROL: 170 mg/dL (ref 0–200)
HDL: 50.7 mg/dL (ref >39.00)
LDL CALC: 104 mg/dL — AB (ref 0–99)
NONHDL: 118.91
Total CHOL/HDL Ratio: 3
Triglycerides: 75 mg/dL (ref 0.0–149.0)
VLDL: 15 mg/dL (ref 0.0–40.0)

## 2017-10-31 LAB — TSH: TSH: 1.32 u[IU]/mL (ref 0.35–4.50)

## 2017-10-31 MED ORDER — DICLOFENAC 35 MG PO CAPS: 1.0000 | ORAL_CAPSULE | Freq: Three times a day (TID) | ORAL | 3 refills | Status: DC | PRN

## 2017-10-31 MED ORDER — FLUOXETINE HCL 10 MG PO TABS: 20.0000 mg | ORAL_TABLET | Freq: Every day | ORAL | 3 refills | Status: DC

## 2017-10-31 NOTE — Patient Instructions (Signed)
Preventive Care 18-39 Years, Female Preventive care refers to lifestyle choices and visits with your health care provider that can promote health and wellness. What does preventive care include?  A yearly physical exam. This is also called an annual well check.  Dental exams once or twice a year.  Routine eye exams. Ask your health care provider how often you should have your eyes checked.  Personal lifestyle choices, including: ? Daily care of your teeth and gums. ? Regular physical activity. ? Eating a healthy diet. ? Avoiding tobacco and drug use. ? Limiting alcohol use. ? Practicing safe sex. ? Taking vitamin and mineral supplements as recommended by your health care provider. What happens during an annual well check? The services and screenings done by your health care provider during your annual well check will depend on your age, overall health, lifestyle risk factors, and family history of disease. Counseling Your health care provider may ask you questions about your:  Alcohol use.  Tobacco use.  Drug use.  Emotional well-being.  Home and relationship well-being.  Sexual activity.  Eating habits.  Work and work Statistician.  Method of birth control.  Menstrual cycle.  Pregnancy history.  Screening You may have the following tests or measurements:  Height, weight, and BMI.  Diabetes screening. This is done by checking your blood sugar (glucose) after you have not eaten for a while (fasting).  Blood pressure.  Lipid and cholesterol levels. These may be checked every 5 years starting at age 66.  Skin check.  Hepatitis C blood test.  Hepatitis B blood test.  Sexually transmitted disease (STD) testing.  BRCA-related cancer screening. This may be done if you have a family history of breast, ovarian, tubal, or peritoneal cancers.  Pelvic exam and Pap test. This may be done every 3 years starting at age 40. Starting at age 59, this may be done every 5  years if you have a Pap test in combination with an HPV test.  Discuss your test results, treatment options, and if necessary, the need for more tests with your health care provider. Vaccines Your health care provider may recommend certain vaccines, such as:  Influenza vaccine. This is recommended every year.  Tetanus, diphtheria, and acellular pertussis (Tdap, Td) vaccine. You may need a Td booster every 10 years.  Varicella vaccine. You may need this if you have not been vaccinated.  HPV vaccine. If you are 69 or younger, you may need three doses over 6 months.  Measles, mumps, and rubella (MMR) vaccine. You may need at least one dose of MMR. You may also need a second dose.  Pneumococcal 13-valent conjugate (PCV13) vaccine. You may need this if you have certain conditions and were not previously vaccinated.  Pneumococcal polysaccharide (PPSV23) vaccine. You may need one or two doses if you smoke cigarettes or if you have certain conditions.  Meningococcal vaccine. One dose is recommended if you are age 27-21 years and a first-year college student living in a residence hall, or if you have one of several medical conditions. You may also need additional booster doses.  Hepatitis A vaccine. You may need this if you have certain conditions or if you travel or work in places where you may be exposed to hepatitis A.  Hepatitis B vaccine. You may need this if you have certain conditions or if you travel or work in places where you may be exposed to hepatitis B.  Haemophilus influenzae type b (Hib) vaccine. You may need this if  you have certain risk factors.  Talk to your health care provider about which screenings and vaccines you need and how often you need them. This information is not intended to replace advice given to you by your health care provider. Make sure you discuss any questions you have with your health care provider. Document Released: 05/04/2001 Document Revised: 11/26/2015  Document Reviewed: 01/07/2015 Elsevier Interactive Patient Education  [employer].

## 2017-10-31 NOTE — Progress Notes (Signed)
Subjective:  Patient ID: Christina [name], female    DOB: 28-Dec-1980  Age: 37 y.o. MRN: [id number]  CC: Annual Exam; Back Pain; and Depression   HPI Mayah M Cairrikier [name] presents for a CPX.  She complains of a several year history of nonradiating low back pain.  She is not getting adequate symptom relief with Motrin.  She previously saw sports medicine and had plain films done that showed DDD.  She denies paresthesias in her lower extremities.  She tells me that her gynecologist is treating PMS with low-dose Prozac which she takes around the time of her cycle.  She tells me this has been added to the Cymbalta that she is taking for chronic pain and depression.  Outpatient Medications Prior to Visit  Medication Sig Dispense Refill  . buPROPion (WELLBUTRIN XL) 150 MG 24 hr tablet Take 1 tablet (150 mg total) by mouth daily. 90 tablet 1  . Dietary Management Product (VASCULERA) TABS Take 1 capsule by mouth daily. 30 tablet 11  . doxepin (SINEQUAN) 10 MG capsule Take 1 capsule (10 mg total) by mouth at bedtime. 90 capsule 1  . fluocinonide cream (LIDEX) 0.05 % Apply 1 application topically 2 (two) times daily. 60 g 2  . SUMAtriptan (IMITREX) 100 MG tablet Take 1 tablet (100 mg total) by mouth every 2 (two) hours as needed for migraine. May repeat in 2 hours if headache persists or recurs. 10 tablet 5  . DULoxetine (CYMBALTA) 60 MG capsule Take 1 capsule (60 mg total) by mouth daily. 90 capsule 3  . FLUoxetine (PROZAC) 10 MG tablet Take 10 mg by mouth daily. Takes 10 days per month starting 1-2 days prior to menses.     No facility-administered medications prior to visit.     ROS Review of Systems  Constitutional: Negative for diaphoresis and fatigue.  HENT: Negative.   Eyes: Negative.   Respiratory: Negative.  Negative for cough and shortness of breath.   Cardiovascular: Negative for chest pain and leg swelling.  Gastrointestinal: Negative for abdominal pain,  constipation, diarrhea and nausea.  Endocrine: Negative.   Genitourinary: Negative.  Negative for decreased urine volume, difficulty urinating, dysuria and urgency.  Musculoskeletal: Positive for back pain. Negative for arthralgias, joint swelling and myalgias.  Skin: Negative for color change and pallor.  Allergic/Immunologic: Negative.   Neurological: Negative.  Negative for dizziness and weakness.  Hematological: Negative for adenopathy. Does not bruise/bleed easily.  Psychiatric/Behavioral: Positive for dysphoric mood. Negative for decreased concentration, self-injury, sleep disturbance and suicidal ideas. The patient is not nervous/anxious.     Objective:  BP 108/72 (BP Location: Left Arm, Patient Position: Sitting, Cuff Size: Large)   Pulse 78   Temp 98.5 F (36.9 C) (Oral)   Resp 16   Ht 5\' 5"  (1.651 m)   Wt 206 lb 1.9 oz (93.5 kg)   LMP 10/28/2017 Comment: ESSURE is in place  SpO2 99%   BMI 34.30 kg/m   BP Readings from Last 3 Encounters:  10/31/17 108/72  09/13/17 102/70  08/17/17 106/68    Wt Readings from Last 3 Encounters:  10/31/17 206 lb 1.9 oz (93.5 kg)  09/13/17 208 lb 6.4 oz (94.5 kg)  08/17/17 207 lb (93.9 kg)    Physical Exam  Constitutional: No distress.  HENT:  Mouth/Throat: Oropharynx is clear and moist. No oropharyngeal exudate.  Eyes: Conjunctivae are normal. No scleral icterus.  Neck: Normal range of motion. Neck supple. No JVD present. No thyromegaly present.  Cardiovascular:  Normal rate, regular rhythm and normal heart sounds. Exam reveals no gallop and no friction rub.  No murmur heard. Pulmonary/Chest: Effort normal and breath sounds normal. She has no wheezes. She has no rhonchi. She has no rales.  Abdominal: Soft. Bowel sounds are normal. She exhibits no mass.  Musculoskeletal: Normal range of motion. She exhibits no edema, tenderness or deformity.       Lumbar back: Normal. She exhibits normal range of motion, no tenderness, no bony  tenderness, no swelling, no edema and no deformity.  Lymphadenopathy:    She has no cervical adenopathy.  Neurological: She has normal strength and normal reflexes. She displays no atrophy and no tremor. No cranial nerve deficit or sensory deficit. She exhibits normal muscle tone. She displays a negative Romberg sign. She displays no seizure activity. Coordination and gait normal.  Neg SLR in BLE  Skin: Skin is warm and dry. She is not diaphoretic. No pallor.  Psychiatric: She has a normal mood and affect. Her behavior is normal. Judgment and thought content normal.  Vitals reviewed.   Lab Results  Component Value Date   WBC 8.7 10/31/2017   HGB 14.0 10/31/2017   HCT 41.2 10/31/2017   PLT 244.0 10/31/2017   GLUCOSE 76 10/31/2017   CHOL 170 10/31/2017   TRIG 75.0 10/31/2017   HDL 50.70 10/31/2017   LDLCALC 104 (H) 10/31/2017   ALT 10 10/31/2017   AST 15 10/31/2017   NA 140 10/31/2017   K 3.7 10/31/2017   CL 103 10/31/2017   CREATININE 0.87 10/31/2017   BUN 11 10/31/2017   CO2 30 10/31/2017   TSH 1.32 10/31/2017   HGBA1C 5.5 08/29/2014    Dg Lumbar Spine Complete  Result Date: 12/22/2015 CLINICAL DATA:  One week of constant low back pain. No mention of injury. EXAM: LUMBAR SPINE - COMPLETE 4+ VIEW COMPARISON:  None in PACs FINDINGS: The lumbar vertebral bodies are preserved in height. The pedicles and transverse processes are intact. The disc space heights are well maintained with exception of L5-S1 where there is moderate narrowing. There is no spondylolisthesis. There is mild facet joint hypertrophy at L5-S1. The observed portions of the sacrum are normal. Essure contraceptive devices are present in the pelvis. IMPRESSION: Degenerative disc space narrowing and facet joint hypertrophy at L5-S1. No compression fracture nor other acute lumbar spine abnormality. Electronically Signed   By: David  SwazilandJordan M.D.   On: 12/22/2015 15:11    Assessment & Plan:   [name] was seen today  for annual exam, back pain and depression.  Diagnoses and all orders for this visit:  DDD (degenerative disc disease), lumbar- I will upgrade her pain relief to diclofenac 3 times a day. -     Diclofenac (ZORVOLEX) 35 MG CAPS; Take 1 capsule by mouth 3 (three) times daily with meals as needed. -     CBC with Differential/Platelet; Future -     TSH; Future -     Comprehensive metabolic panel; Future  Depression with somatization- I will consolidate her antidepressant regimen into a full dose of fluoxetine and will discontinue the Cymbalta.  Labs are negative for secondary or organic causes. -     FLUoxetine (PROZAC) 10 MG tablet; Take 2 tablets (20 mg total) by mouth daily. -     CBC with Differential/Platelet; Future -     TSH; Future -     Comprehensive metabolic panel; Future  Routine general medical examination at a health care facility- Exam completed, labs reviewed,  vaccines reviewed, her Pap smear is up-to-date, patient education material was given. -     Lipid panel; Future   I have discontinued Oluwatoyin M. Cairrikier [name]'s DULoxetine and FLUoxetine. I am also having her start on Diclofenac and FLUoxetine. Additionally, I am having her maintain her buPROPion, VASCULERA, SUMAtriptan, fluocinonide cream, and doxepin.  Meds ordered this encounter  Medications  . Diclofenac (ZORVOLEX) 35 MG CAPS    Sig: Take 1 capsule by mouth 3 (three) times daily with meals as needed.    Dispense:  90 capsule    Refill:  3  . FLUoxetine (PROZAC) 10 MG tablet    Sig: Take 2 tablets (20 mg total) by mouth daily.    Dispense:  180 tablet    Refill:  3     Follow-up: Return if symptoms worsen or fail to improve.  Sanda Linger, MD

## 2017-11-01 ENCOUNTER — Encounter: Payer: Self-pay | Admitting: Internal Medicine

## 2017-11-22 DIAGNOSIS — Z01419 Encounter for gynecological examination (general) (routine) without abnormal findings: Secondary | ICD-10-CM | POA: Diagnosis not present

## 2017-11-22 DIAGNOSIS — N943 Premenstrual tension syndrome: Secondary | ICD-10-CM | POA: Diagnosis not present

## 2017-11-22 DIAGNOSIS — Z1389 Encounter for screening for other disorder: Secondary | ICD-10-CM | POA: Diagnosis not present

## 2017-11-22 DIAGNOSIS — Z6834 Body mass index (BMI) 34.0-34.9, adult: Secondary | ICD-10-CM | POA: Diagnosis not present

## 2017-11-22 DIAGNOSIS — Z130 Encounter for screening for diseases of the blood and blood-forming organs and certain disorders involving the immune mechanism: Secondary | ICD-10-CM | POA: Diagnosis not present

## 2017-11-22 DIAGNOSIS — N393 Stress incontinence (female) (male): Secondary | ICD-10-CM | POA: Diagnosis not present

## 2017-12-02 ENCOUNTER — Other Ambulatory Visit: Payer: Self-pay | Admitting: Internal Medicine

## 2017-12-02 MED FILL — FLUOCINONIDE 0.05% CREAM: 0.05 | 30 days supply | Qty: 60 | Fill #1

## 2017-12-08 ENCOUNTER — Ambulatory Visit (INDEPENDENT_AMBULATORY_CARE_PROVIDER_SITE_OTHER): Payer: 59

## 2017-12-08 DIAGNOSIS — Z23 Encounter for immunization: Secondary | ICD-10-CM

## 2017-12-23 ENCOUNTER — Other Ambulatory Visit: Payer: Self-pay

## 2017-12-23 DIAGNOSIS — F329 Major depressive disorder, single episode, unspecified: Secondary | ICD-10-CM

## 2017-12-23 DIAGNOSIS — F450 Somatization disorder: Principal | ICD-10-CM

## 2017-12-23 DIAGNOSIS — F32A Depression, unspecified: Secondary | ICD-10-CM

## 2017-12-23 DIAGNOSIS — F17200 Nicotine dependence, unspecified, uncomplicated: Secondary | ICD-10-CM

## 2017-12-23 MED ORDER — BUPROPION HCL ER (XL) 150 MG PO TB24: 150.0000 mg | ORAL_TABLET | Freq: Every day | ORAL | 1 refills | Status: DC

## 2018-01-05 ENCOUNTER — Encounter: Payer: Self-pay | Admitting: Internal Medicine

## 2018-01-05 ENCOUNTER — Ambulatory Visit (INDEPENDENT_AMBULATORY_CARE_PROVIDER_SITE_OTHER): Payer: 59 | Admitting: Internal Medicine

## 2018-01-05 VITALS — BP 110/78 | HR 70 | Temp 98.5°F | Ht 65.0 in | Wt 202.1 lb

## 2018-01-05 DIAGNOSIS — E669 Obesity, unspecified: Secondary | ICD-10-CM

## 2018-01-05 MED FILL — buPROPion HCL ER (XL) 150 M: 150 | 90 days supply | Qty: 90 | Fill #0

## 2018-01-05 NOTE — Progress Notes (Signed)
Subjective:  Patient ID: Christina [name], female    DOB: Dec 21, 1980  Age: 37 y.o. MRN: [id number]  CC: Follow-up   HPI [facility] Christina [name] presents for a request that she be referred to a bariatric clinic to help her lose weight. She offers no complaints today.  Outpatient Medications Prior to Visit  Medication Sig Dispense Refill  . buPROPion (WELLBUTRIN XL) 150 MG 24 hr tablet Take 1 tablet (150 mg total) by mouth daily. 90 tablet 1  . Diclofenac (ZORVOLEX) 35 MG CAPS Take 1 capsule by mouth 3 (three) times daily with meals as needed. 90 capsule 3  . FLUoxetine (PROZAC) 10 MG tablet Take 2 tablets (20 mg total) by mouth daily. 180 tablet 3  . Dietary Management Product (VASCULERA) TABS Take 1 capsule by mouth daily. (Patient not taking: Reported on 01/05/2018) 30 tablet 11  . doxepin (SINEQUAN) 10 MG capsule Take 1 capsule (10 mg total) by mouth at bedtime. (Patient not taking: Reported on 01/05/2018) 90 capsule 1  . fluocinonide cream (LIDEX) 0.05 % Apply 1 application topically 2 (two) times daily. (Patient not taking: Reported on 01/05/2018) 60 g 2  . SUMAtriptan (IMITREX) 100 MG tablet Take 1 tablet (100 mg total) by mouth every 2 (two) hours as needed for migraine. May repeat in 2 hours if headache persists or recurs. (Patient not taking: Reported on 01/05/2018) 10 tablet 5   No facility-administered medications prior to visit.     ROS Review of Systems  All other systems reviewed and are negative.   Objective:  BP 110/78 (BP Location: Left Arm, Patient Position: Sitting, Cuff Size: Normal)   Pulse 70   Temp 98.5 F (36.9 C) (Oral)   Ht 5\' 5"  (1.651 m)   Wt 202 lb 1.3 oz (91.7 kg)   LMP 12/19/2017   SpO2 98%   BMI 33.63 kg/m   BP Readings from Last 3 Encounters:  01/05/18 110/78  10/31/17 108/72  09/13/17 102/70    Wt Readings from Last 3 Encounters:  01/05/18 202 lb 1.3 oz (91.7 kg)  10/31/17 206 lb 1.9 oz (93.5 kg)  09/13/17 208  lb 6.4 oz (94.5 kg)    Physical Exam  Constitutional: She is oriented to person, place, and time. No distress.  HENT:  Mouth/Throat: Oropharynx is clear and moist. No oropharyngeal exudate.  Eyes: Conjunctivae are normal. No scleral icterus.  Neck: Normal range of motion. Neck supple. No JVD present. No thyromegaly present.  Cardiovascular: Normal rate, regular rhythm and normal heart sounds. Exam reveals no gallop and no friction rub.  No murmur heard. Pulmonary/Chest: Effort normal and breath sounds normal. No respiratory distress. She has no wheezes. She has no rales.  Abdominal: Soft. Bowel sounds are normal. She exhibits no mass. There is no tenderness.  Musculoskeletal: Normal range of motion. She exhibits no edema, tenderness or deformity.  Lymphadenopathy:    She has no cervical adenopathy.  Neurological: She is alert and oriented to person, place, and time.  Skin: Skin is warm and dry. She is not diaphoretic. No pallor.    Lab Results  Component Value Date   WBC 8.7 10/31/2017   HGB 14.0 10/31/2017   HCT 41.2 10/31/2017   PLT 244.0 10/31/2017   GLUCOSE 76 10/31/2017   CHOL 170 10/31/2017   TRIG 75.0 10/31/2017   HDL 50.70 10/31/2017   LDLCALC 104 (H) 10/31/2017   ALT 10 10/31/2017   AST 15 10/31/2017   NA 140 10/31/2017  K 3.7 10/31/2017   CL 103 10/31/2017   CREATININE 0.87 10/31/2017   BUN 11 10/31/2017   CO2 30 10/31/2017   TSH 1.32 10/31/2017   HGBA1C 5.5 08/29/2014    Dg Lumbar Spine Complete  Result Date: 12/22/2015 CLINICAL DATA:  One week of constant low back pain. No mention of injury. EXAM: LUMBAR SPINE - COMPLETE 4+ VIEW COMPARISON:  None in PACs FINDINGS: The lumbar vertebral bodies are preserved in height. The pedicles and transverse processes are intact. The disc space heights are well maintained with exception of L5-S1 where there is moderate narrowing. There is no spondylolisthesis. There is mild facet joint hypertrophy at L5-S1. The observed  portions of the sacrum are normal. Essure contraceptive devices are present in the pelvis. IMPRESSION: Degenerative disc space narrowing and facet joint hypertrophy at L5-S1. No compression fracture nor other acute lumbar spine abnormality. Electronically Signed   By: David  [place] M.D.   On: 12/22/2015 15:11    Assessment & Plan:   Derya was seen today for follow-up.  Diagnoses and all orders for this visit:  Obesity (BMI 30.0-34.9) -     Amb Ref to Medical Weight Management   I am having Shandy M. Cairrikier Christina [name] maintain her VASCULERA, SUMAtriptan, fluocinonide cream, doxepin, Diclofenac, FLUoxetine, and buPROPion.  No orders of the defined types were placed in this encounter.    Follow-up: No follow-ups on file.  Sanda Linger, MD

## 2018-01-09 ENCOUNTER — Encounter: Payer: Self-pay | Admitting: Internal Medicine

## 2018-01-09 NOTE — Patient Instructions (Signed)
Obesity, Adult  Obesity is the condition of having too much total body fat. Being overweight or obese means that your weight is greater than what is considered healthy for your body size. Obesity is determined by a measurement called BMI. BMI is an estimate of body fat and is calculated from height and weight. For adults, a BMI of 30 or higher is considered obese.  Obesity can eventually lead to other health concerns and major illnesses, including:  · Stroke.  · Coronary artery disease (CAD).  · Type 2 diabetes.  · Some types of cancer, including cancers of the colon, breast, uterus, and gallbladder.  · Osteoarthritis.  · High blood pressure (hypertension).  · High cholesterol.  · Sleep apnea.  · Gallbladder stones.  · Infertility problems.    What are the causes?  The main cause of obesity is taking in (consuming) more calories than your body uses for energy. Other factors that contribute to this condition may include:  · Being born with genes that make you more likely to become obese.  · Having a medical condition that causes obesity. These conditions include:  ? Hypothyroidism.  ? Polycystic ovarian syndrome (PCOS).  ? Binge-eating disorder.  ? Cushing syndrome.  · Taking certain medicines, such as steroids, antidepressants, and seizure medicines.  · Not being physically active (sedentary lifestyle).  · Living where there are limited places to exercise safely or buy healthy foods.  · Not getting enough sleep.    What increases the risk?  The following factors may increase your risk of this condition:  · Having a family history of obesity.  · Being a woman of African-American descent.  · Being a man of Hispanic descent.    What are the signs or symptoms?  Having excessive body fat is the main symptom of this condition.  How is this diagnosed?  This condition may be diagnosed based on:  · Your symptoms.  · Your medical history.  · A physical exam. Your health care provider may measure:  ? Your BMI. If you are an  adult with a BMI between 25 and less than 30, you are considered overweight. If you are an adult with a BMI of 30 or higher, you are considered obese.  ? The distances around your hips and your waist (circumferences). These may be compared to each other to help diagnose your condition.  ? Your skinfold thickness. Your health care provider may gently pinch a fold of your skin and measure it.    How is this treated?  Treatment for this condition often includes changing your lifestyle. Treatment may include some or all of the following:  · Dietary changes. Work with your health care provider and a dietitian to set a weight-loss goal that is healthy and reasonable for you. Dietary changes may include eating:  ? Smaller portions. A portion size is the amount of a particular food that is healthy for you to eat at one time. This varies from person to person.  ? Low-calorie or low-fat options.  ? More whole grains, fruits, and vegetables.  · Regular physical activity. This may include aerobic activity (cardio) and strength training.  · Medicine to help you lose weight. Your health care provider may prescribe medicine if you are unable to lose 1 pound a week after 6 weeks of eating more healthily and doing more physical activity.  · Surgery. Surgical options may include gastric banding and gastric bypass. Surgery may be done if:  ? Other   treatments have not helped to improve your condition.  ? You have a BMI of 40 or higher.  ? You have life-threatening health problems related to obesity.    Follow these instructions at home:    Eating and drinking    · Follow recommendations from your health care provider about what you eat and drink. Your health care provider may advise you to:  ? Limit fast foods, sweets, and processed snack foods.  ? Choose low-fat options, such as low-fat milk instead of whole milk.  ? Eat 5 or more servings of fruits or vegetables every day.  ? Eat at home more often. This gives you more control over  what you eat.  ? Choose healthy foods when you eat out.  ? Learn what a healthy portion size is.  ? Keep low-fat snacks on hand.  ? Avoid sugary drinks, such as soda, fruit juice, iced tea sweetened with sugar, and flavored milk.  ? Eat a healthy breakfast.  · Drink enough water to keep your urine clear or pale yellow.  · Do not go without eating for long periods of time (do not fast) or follow a fad diet. Fasting and fad diets can be unhealthy and even dangerous.  Physical Activity  · Exercise regularly, as told by your health care provider. Ask your health care provider what types of exercise are safe for you and how often you should exercise.  · Warm up and stretch before being active.  · Cool down and stretch after being active.  · Rest between periods of activity.  Lifestyle  · Limit the time that you spend in front of your TV, computer, or video game system.  · Find ways to reward yourself that do not involve food.  · Limit alcohol intake to no more than 1 drink a day for nonpregnant women and 2 drinks a day for men. One drink equals 12 oz of beer, 5 oz of wine, or 1½ oz of hard liquor.  General instructions  · Keep a weight loss journal to keep track of the food you eat and how much you exercise you get.  · Take over-the-counter and prescription medicines only as told by your health care provider.  · Take vitamins and supplements only as told by your health care provider.  · Consider joining a support group. Your health care provider may be able to recommend a support group.  · Keep all follow-up visits as told by your health care provider. This is important.  Contact a health care provider if:  · You are unable to meet your weight loss goal after 6 weeks of dietary and lifestyle changes.  This information is not intended to replace advice given to you by your health care provider. Make sure you discuss any questions you have with your health care provider.  Document Released: 04/15/2004 Document Revised:  08/11/2015 Document Reviewed: 12/25/2014  Elsevier Interactive Patient Education © 2018 [employer]

## 2018-01-22 ENCOUNTER — Other Ambulatory Visit: Payer: Self-pay

## 2018-01-22 ENCOUNTER — Encounter (HOSPITAL_COMMUNITY): Payer: Self-pay

## 2018-01-22 ENCOUNTER — Ambulatory Visit (INDEPENDENT_AMBULATORY_CARE_PROVIDER_SITE_OTHER): Payer: 59

## 2018-01-22 ENCOUNTER — Ambulatory Visit (HOSPITAL_COMMUNITY)
Admission: EM | Admit: 2018-01-22 | Discharge: 2018-01-22 | Disposition: A | Discharge status: Home / Self Care | Payer: 59 | Attending: Family Medicine | Admitting: Family Medicine

## 2018-01-22 DIAGNOSIS — J181 Lobar pneumonia, unspecified organism: Secondary | ICD-10-CM | POA: Diagnosis not present

## 2018-01-22 DIAGNOSIS — J189 Pneumonia, unspecified organism: Secondary | ICD-10-CM

## 2018-01-22 DIAGNOSIS — R05 Cough: Secondary | ICD-10-CM | POA: Diagnosis not present

## 2018-01-22 MED ORDER — AZITHROMYCIN 250 MG PO TABS: ORAL_TABLET | ORAL | 0 refills | Status: DC

## 2018-01-22 MED ORDER — CEFTRIAXONE SODIUM 1 G IJ SOLR
INTRAMUSCULAR | Status: AC
  Filled 2018-01-22: qty 10

## 2018-01-22 MED ORDER — CEFTRIAXONE SODIUM 1 G IJ SOLR
1.0000 g | Freq: Once | INTRAMUSCULAR | Status: AC
  Administered 2018-01-22: 1 g via INTRAMUSCULAR

## 2018-01-22 NOTE — ED Triage Notes (Signed)
Pt thinks she has the flu  This started yesterday. C/o cough , congestion, fever and haedache

## 2018-01-22 NOTE — ED Provider Notes (Signed)
[facility]    CSN: [id number] Arrival date & time: 01/22/18  1007     History   Chief Complaint Chief Complaint  Patient presents with  . Influenza    HPI Christina [name] is a 37 y.o. female.   She is presenting with cough, muscle aches, and malaise.  Her symptoms started on Friday.  She denies any fevers.  She works at a [employer] trade [employer] and is exposed to sick individuals.  She feels like her symptoms are getting worse.  She did receive the flu vaccine.  Has had some production with the cough.  Denies any blood tinged sputum.  She is a active smoker.  HPI  Past Medical History:  Diagnosis Date  . Migraine   . Normal pregnancy 03/11/2012  . Ovarian cyst   . Postpartum hemorrhage   . SVD (spontaneous vaginal delivery) 03/11/2012    Patient Active Problem List   Diagnosis Date Noted  . DDD (degenerative disc disease), lumbar 10/31/2017  . Varicose veins of right lower extremity with complications 09/13/2017  . Eczema 08/17/2017  . Middle insomnia 04/20/2016  . SI (sacroiliac) joint dysfunction 12/04/2015  . Depression with somatization 11/22/2014  . Routine general medical examination at a health care facility 08/29/2014  . Obesity (BMI 30.0-34.9) 08/28/2014  . Hyperglycemia 08/28/2014  . Tobacco use disorder 02/23/2014  . Migraines 02/23/2014    History reviewed. No pertinent surgical history.  OB History    Gravida  4   Para  2   Term  1   Preterm      AB  2   Living  2     SAB  2   TAB  0   Ectopic      Multiple      Live Births  1            Home Medications    Prior to Admission medications   Medication Sig Start Date End Date Taking? Authorizing Provider  azithromycin (ZITHROMAX) 250 MG tablet Take first 2 tablets together, then 1 every day until finished. 01/22/18   Myra Rude, MD  buPROPion (WELLBUTRIN XL) 150 MG 24 hr tablet Take 1 tablet (150 mg total) by mouth daily. 12/23/17   Etta Grandchild,  MD  Diclofenac (ZORVOLEX) 35 MG CAPS Take 1 capsule by mouth 3 (three) times daily with meals as needed. 10/31/17   Etta Grandchild, MD  Dietary Management Product (VASCULERA) TABS Take 1 capsule by mouth daily. Patient not taking: Reported on 01/05/2018 05/26/17   Etta Grandchild, MD  doxepin (SINEQUAN) 10 MG capsule Take 1 capsule (10 mg total) by mouth at bedtime. Patient not taking: Reported on 01/05/2018 08/17/17   Etta Grandchild, MD  fluocinonide cream (LIDEX) 0.05 % Apply 1 application topically 2 (two) times daily. Patient not taking: Reported on 01/05/2018 08/17/17   Etta Grandchild, MD  FLUoxetine (PROZAC) 10 MG tablet Take 2 tablets (20 mg total) by mouth daily. 10/31/17   Etta Grandchild, MD  SUMAtriptan (IMITREX) 100 MG tablet Take 1 tablet (100 mg total) by mouth every 2 (two) hours as needed for migraine. May repeat in 2 hours if headache persists or recurs. Patient not taking: Reported on 01/05/2018 06/07/17   Etta Grandchild, MD    Family History Family History  Problem Relation Age of Onset  . Arthritis Father   . Sudden death Father 60       Sinus Cancer  .  Cancer Father        nasal cancer  . Bipolar disorder Mother   . Sudden death Brother        GSW  . Arthritis Maternal Grandfather   . Hyperlipidemia Maternal Grandfather   . Heart disease Maternal Grandfather   . Breast cancer Maternal Grandmother   . Arthritis Maternal Grandmother   . Lung cancer Maternal Grandmother   . Heart disease Maternal Grandmother   . Hyperlipidemia Paternal Grandmother   . Stroke Paternal Grandfather     Social History Social History   Tobacco Use  . Smoking status: Current Every Day Smoker    Packs/day: 0.50    Years: 20.00    Pack years: 10.00    Types: Cigarettes  . Smokeless tobacco: Never Used  Substance Use Topics  . Alcohol use: Yes    Alcohol/week: 0.0 standard drinks    Comment: occassionally for social functions  . Drug use: No     Allergies   Hydrocodone and  Prednisone   Review of Systems Review of Systems  Constitutional: Positive for activity change. Negative for fever.  HENT: Positive for congestion.   Respiratory: Positive for cough.   Cardiovascular: Negative for chest pain.  Gastrointestinal: Negative for abdominal pain.  Musculoskeletal: Negative for back pain.  Skin: Negative for color change.  Neurological: Negative for weakness.  Hematological: Negative for adenopathy.  Psychiatric/Behavioral: Negative for agitation.     Physical Exam Triage Vital Signs ED Triage Vitals  Enc Vitals Group     BP 01/22/18 1034 113/78     Pulse Rate 01/22/18 1034 (!) 109     Resp 01/22/18 1034 20     Temp 01/22/18 1034 99.8 F (37.7 C)     Temp Source 01/22/18 1034 Oral     SpO2 01/22/18 1034 100 %     Weight 01/22/18 1053 201 lb (91.2 kg)     Height --      Head Circumference --      Peak Flow --      Pain Score --      Pain Loc --      Pain Edu? --      Excl. in GC? --    No data found.  Updated Vital Signs BP 113/78 (BP Location: Left Arm)   Pulse (!) 109   Temp 99.8 F (37.7 C) (Oral)   Resp 20   Wt 91.2 kg   LMP 01/13/2018   SpO2 100%   BMI 33.45 kg/m   Visual Acuity Right Eye Distance:   Left Eye Distance:   Bilateral Distance:    Right Eye Near:   Left Eye Near:    Bilateral Near:     Physical Exam Gen: NAD, alert, cooperative with exam,  ENT: normal lips, normal nasal mucosa, tympanic membranes clear and intact bilaterally, normal oropharynx, no cervical lymphadenopathy Eye: normal EOM, normal conjunctiva and lids CV:  no edema, +2 pedal pulses, regular rate and rhythm, S1-S2   Resp: no accessory muscle use, non-labored, clear to auscultation bilaterally, no crackles or wheezes Skin: no rashes, no areas of induration  Neuro: normal tone, normal sensation to touch Psych:  normal insight, alert and oriented MSK: Normal gait, normal strength    UC Treatments / Results  Labs (all labs ordered are  listed, but only abnormal results are displayed) Labs Reviewed - No data to display  EKG None  Radiology Dg Chest 2 View  Result Date: 01/22/2018 CLINICAL DATA:  Cough for  2 days EXAM: CHEST - 2 VIEW COMPARISON:  None. FINDINGS: Hazy left upper lobe airspace disease. No other focal consolidation, pleural effusion or pneumothorax. Normal heart mediastinum. No aggressive osseous lesion. IMPRESSION: 1. Hazy left upper lobe airspace disease concerning for pneumonia. Electronically Signed   By: Elige Ko   On: 01/22/2018 11:45    Procedures Procedures (including critical care time)  Medications Ordered in UC Medications  cefTRIAXone (ROCEPHIN) injection 1 g (has no administration in time range)    Initial Impression / Assessment and Plan / UC Course  I have reviewed the triage vital signs and the nursing notes.  Pertinent labs & imaging results that were available during my care of the patient were reviewed by me and considered in my medical decision making (see chart for details).     Christina [name] is a 37 year old female is presenting with cough, malaise, and body aches.  Chest x-ray shows a possible infiltrate in the left lobe.  Denies any significant fevers.  She does work in a [employer], trade [employer] and does have exposure.  She did receive the flu vaccine.  Provided ceftriaxone in the clinic.  Will send home with azithromycin.  Counseled on supportive care.  Given indications to follow-up.  Final Clinical Impressions(s) / UC Diagnoses   Final diagnoses:  Pneumonia of left upper lobe due to infectious organism [facility])     Discharge Instructions     Please take the medication  Please stay well hydrated  Please follow up if your symptoms are not improving.     ED Prescriptions    Medication Sig Dispense Auth. Provider   azithromycin (ZITHROMAX) 250 MG tablet Take first 2 tablets together, then 1 every day until finished. 6 tablet Myra Rude, MD     Controlled Substance  Prescriptions [place] Controlled Substance Registry consulted? Not Applicable   Myra Rude, MD 01/22/18 1217

## 2018-01-22 NOTE — Discharge Instructions (Signed)
Please take the medication  Please stay well hydrated  Please follow up if your symptoms are not improving.

## 2018-01-30 ENCOUNTER — Encounter: Payer: Self-pay | Admitting: Internal Medicine

## 2018-01-30 ENCOUNTER — Ambulatory Visit (INDEPENDENT_AMBULATORY_CARE_PROVIDER_SITE_OTHER): Payer: 59 | Admitting: Internal Medicine

## 2018-01-30 ENCOUNTER — Ambulatory Visit (INDEPENDENT_AMBULATORY_CARE_PROVIDER_SITE_OTHER)
Admission: RE | Admit: 2018-01-30 | Discharge: 2018-01-30 | Disposition: A | Discharge status: Home / Self Care | Payer: 59 | Source: Ambulatory Visit | Attending: Internal Medicine | Admitting: Internal Medicine

## 2018-01-30 VITALS — BP 120/82 | HR 83 | Temp 98.5°F | Ht 65.0 in | Wt 202.0 lb

## 2018-01-30 DIAGNOSIS — J189 Pneumonia, unspecified organism: Secondary | ICD-10-CM

## 2018-01-30 DIAGNOSIS — J301 Allergic rhinitis due to pollen: Secondary | ICD-10-CM | POA: Diagnosis not present

## 2018-01-30 DIAGNOSIS — J45909 Unspecified asthma, uncomplicated: Secondary | ICD-10-CM | POA: Insufficient documentation

## 2018-01-30 DIAGNOSIS — J4521 Mild intermittent asthma with (acute) exacerbation: Secondary | ICD-10-CM | POA: Diagnosis not present

## 2018-01-30 DIAGNOSIS — J309 Allergic rhinitis, unspecified: Secondary | ICD-10-CM | POA: Insufficient documentation

## 2018-01-30 DIAGNOSIS — R05 Cough: Secondary | ICD-10-CM | POA: Diagnosis not present

## 2018-01-30 LAB — POCT EXHALED NITRIC OXIDE: FENO LEVEL (PPB): 121

## 2018-01-30 MED ORDER — AMOXICILLIN-POT CLAVULANATE 875-125 MG PO TABS: 1.0000 | ORAL_TABLET | Freq: Two times a day (BID) | ORAL | 0 refills | Status: AC

## 2018-01-30 MED ORDER — FLUTICASONE FUROATE-VILANTEROL 200-25 MCG/INH IN AEPB: 1.0000 | INHALATION_SPRAY | Freq: Every day | RESPIRATORY_TRACT | 1 refills | Status: DC

## 2018-01-30 MED ORDER — METHYLPREDNISOLONE ACETATE 80 MG/ML IJ SUSP
120.0000 mg | Freq: Once | INTRAMUSCULAR | Status: AC
  Administered 2018-01-30: 120 mg via INTRAMUSCULAR

## 2018-01-30 MED FILL — AMOX-CLAV 875-125 MG TABLET: 875-125 | 10 days supply | Qty: 20 | Fill #0

## 2018-01-30 NOTE — Progress Notes (Signed)
Subjective:  Patient ID: Christina [name], female    DOB: 1981-02-21  Age: 37 y.o. MRN: [id number]  CC: Cough   HPI Jadie M Cairrikier [name] presents for f/up -she was diagnosed with left-sided pneumonia about 10 days ago.  She completed a course of Zithromax but does not feel like she is getting any better.  She continues to complain of cough that is productive of thick yellow-green phlegm.  She has nasal congestion, runny nose, shortness of breath, and wheezing.  Outpatient Medications Prior to Visit  Medication Sig Dispense Refill  . azithromycin (ZITHROMAX) 250 MG tablet Take first 2 tablets together, then 1 every day until finished. 6 tablet 0  . buPROPion (WELLBUTRIN XL) 150 MG 24 hr tablet Take 1 tablet (150 mg total) by mouth daily. 90 tablet 1  . Diclofenac (ZORVOLEX) 35 MG CAPS Take 1 capsule by mouth 3 (three) times daily with meals as needed. 90 capsule 3  . Dietary Management Product (VASCULERA) TABS Take 1 capsule by mouth daily. 30 tablet 11  . doxepin (SINEQUAN) 10 MG capsule Take 1 capsule (10 mg total) by mouth at bedtime. 90 capsule 1  . fluocinonide cream (LIDEX) 5.40 % Apply 1 application topically 2 (two) times daily. 60 g 2  . FLUoxetine (PROZAC) 10 MG tablet Take 2 tablets (20 mg total) by mouth daily. 180 tablet 3  . SUMAtriptan (IMITREX) 100 MG tablet Take 1 tablet (100 mg total) by mouth every 2 (two) hours as needed for migraine. May repeat in 2 hours if headache persists or recurs. 10 tablet 5   No facility-administered medications prior to visit.     ROS Review of Systems  Constitutional: Positive for fatigue. Negative for chills, diaphoresis, fever and unexpected weight change.  HENT: Positive for congestion, postnasal drip and rhinorrhea. Negative for facial swelling, sinus pressure, sinus pain, sneezing, sore throat, trouble swallowing and voice change.   Respiratory: Positive for cough, shortness of breath and wheezing.     Cardiovascular: Negative for chest pain, palpitations and leg swelling.  Gastrointestinal: Negative for abdominal pain, constipation, diarrhea, nausea and vomiting.  Genitourinary: Negative.  Negative for difficulty urinating.  Musculoskeletal: Negative.  Negative for arthralgias and myalgias.  Skin: Negative for color change, pallor and rash.  Neurological: Negative.  Negative for dizziness, weakness and light-headedness.  Hematological: Negative for adenopathy. Does not bruise/bleed easily.  Psychiatric/Behavioral: Negative.     Objective:  BP 120/82   Pulse 83   Temp 98.5 F (36.9 C)   Ht _0  (1.651 m)   Wt 202 lb (91.6 kg)   LMP 01/13/2018   SpO2 93%   BMI 33.61 kg/m   BP Readings from Last 3 Encounters:  01/30/18 120/82  01/22/18 113/78  01/05/18 110/78    Wt Readings from Last 3 Encounters:  01/30/18 202 lb (91.6 kg)  01/22/18 201 lb (91.2 kg)  01/05/18 202 lb 1.3 oz (91.7 kg)    Physical Exam  Constitutional: She is oriented to person, place, and time.  Non-toxic appearance. She does not have a sickly appearance. She appears ill. No distress.  HENT:  Mouth/Throat: Oropharynx is clear and moist. No oropharyngeal exudate.  Eyes: Conjunctivae are normal. No scleral icterus.  Neck: Normal range of motion. Neck supple. No JVD present. No thyromegaly present.  Cardiovascular: Normal rate, regular rhythm and normal heart sounds. Exam reveals no gallop and no friction rub.  No murmur heard. Pulmonary/Chest: Effort normal and breath sounds normal. No respiratory distress. She  has no wheezes. She has no rhonchi. She has no rales.  Abdominal: Soft. Bowel sounds are normal. She exhibits no mass. There is no tenderness.  Musculoskeletal: Normal range of motion. She exhibits no edema, tenderness or deformity.  Lymphadenopathy:    She has no cervical adenopathy.  Neurological: She is alert and oriented to person, place, and time.  Skin: Skin is warm and dry. She is not  diaphoretic. No pallor.  Vitals reviewed.   Lab Results  Component Value Date   WBC 8.7 10/31/2017   HGB 14.0 10/31/2017   HCT 41.2 10/31/2017   PLT 244.0 10/31/2017   GLUCOSE 76 10/31/2017   CHOL 170 10/31/2017   TRIG 75.0 10/31/2017   HDL 50.70 10/31/2017   LDLCALC 104 (H) 10/31/2017   ALT 10 10/31/2017   AST 15 10/31/2017   NA 140 10/31/2017   K 3.7 10/31/2017   CL 103 10/31/2017   CREATININE 0.87 10/31/2017   BUN 11 10/31/2017   CO2 30 10/31/2017   TSH 1.32 10/31/2017   HGBA1C 5.5 08/29/2014    Dg Chest 2 View  Result Date: 01/22/2018 CLINICAL DATA:  Cough for 2 days EXAM: CHEST - 2 VIEW COMPARISON:  None. FINDINGS: Hazy left upper lobe airspace disease. No other focal consolidation, pleural effusion or pneumothorax. Normal heart mediastinum. No aggressive osseous lesion. IMPRESSION: 1. Hazy left upper lobe airspace disease concerning for pneumonia. Electronically Signed   By: Kathreen Devoid   On: 01/22/2018 11:45    Assessment & Plan:   Poonam was seen today for cough.  Diagnoses and all orders for this visit:  Pneumonia of both lungs due to infectious organism, unspecified part of lung- Her chest x-ray shows some improvement but based on her symptoms it appears that she has residual infection.  I have asked her to take of a course of Augmentin this time. -     DG Chest 2 View; Future -     amoxicillin-clavulanate (AUGMENTIN) 875-125 MG tablet; Take 1 tablet by mouth 2 (two) times daily for 10 days.  Mild intermittent asthmatic bronchitis with acute exacerbation- She has a myriad of symptoms and a very high FeNO score.  I have therefore asked that she receive a course of systemic steroids and start a Labe/ICS inhaler. -     POCT EXHALED NITRIC OXIDE -     methylPREDNISolone acetate (DEPO-MEDROL) injection 120 mg -     fluticasone furoate-vilanterol (BREO ELLIPTA) 200-25 MCG/INH AEPB; Inhale 1 puff into the lungs daily.  Seasonal allergic rhinitis due to pollen -      POCT EXHALED NITRIC OXIDE -     methylPREDNISolone acetate (DEPO-MEDROL) injection 120 mg   I am having Jesika M. Cairrikier [name] start on amoxicillin-clavulanate and fluticasone furoate-vilanterol. I am also having her maintain her VASCULERA, SUMAtriptan, fluocinonide cream, doxepin, Diclofenac, FLUoxetine, buPROPion, and azithromycin. We administered methylPREDNISolone acetate.  Meds ordered this encounter  Medications  . methylPREDNISolone acetate (DEPO-MEDROL) injection 120 mg  . amoxicillin-clavulanate (AUGMENTIN) 875-125 MG tablet    Sig: Take 1 tablet by mouth 2 (two) times daily for 10 days.    Dispense:  20 tablet    Refill:  0  . fluticasone furoate-vilanterol (BREO ELLIPTA) 200-25 MCG/INH AEPB    Sig: Inhale 1 puff into the lungs daily.    Dispense:  90 each    Refill:  1     Follow-up: No follow-ups on file.  Scarlette Calico, MD

## 2018-01-30 NOTE — Patient Instructions (Signed)

## 2018-02-06 ENCOUNTER — Ambulatory Visit (INDEPENDENT_AMBULATORY_CARE_PROVIDER_SITE_OTHER): Payer: 59 | Admitting: Internal Medicine

## 2018-02-06 VITALS — BP 118/78 | HR 84 | Temp 98.4°F | Resp 16 | Ht 65.0 in | Wt 199.0 lb

## 2018-02-06 DIAGNOSIS — J189 Pneumonia, unspecified organism: Secondary | ICD-10-CM

## 2018-02-06 DIAGNOSIS — J4520 Mild intermittent asthma, uncomplicated: Secondary | ICD-10-CM

## 2018-02-06 NOTE — Progress Notes (Signed)
Subjective:  Patient ID: Christina [name], female    DOB: 06-04-1980  Age: 37 y.o. MRN: [id number]  CC: URI   HPI Christina [name] presents for f/up - She feels much better.  Her only lingering symptom is a rare, improved nonproductive cough and nasal congestion.  Outpatient Medications Prior to Visit  Medication Sig Dispense Refill  . amoxicillin-clavulanate (AUGMENTIN) 875-125 MG tablet Take 1 tablet by mouth 2 (two) times daily for 10 days. 20 tablet 0  . buPROPion (WELLBUTRIN XL) 150 MG 24 hr tablet Take 1 tablet (150 mg total) by mouth daily. 90 tablet 1  . Diclofenac (ZORVOLEX) 35 MG CAPS Take 1 capsule by mouth 3 (three) times daily with meals as needed. 90 capsule 3  . Dietary Management Product (VASCULERA) TABS Take 1 capsule by mouth daily. 30 tablet 11  . fluocinonide cream (LIDEX) 0.05 % Apply 1 application topically 2 (two) times daily. 60 g 2  . FLUoxetine (PROZAC) 10 MG tablet Take 2 tablets (20 mg total) by mouth daily. 180 tablet 3  . fluticasone furoate-vilanterol (BREO ELLIPTA) 200-25 MCG/INH AEPB Inhale 1 puff into the lungs daily. 90 each 1  . SUMAtriptan (IMITREX) 100 MG tablet Take 1 tablet (100 mg total) by mouth every 2 (two) hours as needed for migraine. May repeat in 2 hours if headache persists or recurs. 10 tablet 5  . azithromycin (ZITHROMAX) 250 MG tablet Take first 2 tablets together, then 1 every day until finished. (Patient not taking: Reported on 02/06/2018) 6 tablet 0  . doxepin (SINEQUAN) 10 MG capsule Take 1 capsule (10 mg total) by mouth at bedtime. (Patient not taking: Reported on 02/06/2018) 90 capsule 1   No facility-administered medications prior to visit.     ROS Review of Systems  Constitutional: Negative for fatigue, fever and unexpected weight change.  HENT: Negative.  Negative for rhinorrhea, sinus pressure, sinus pain, sore throat and trouble swallowing.   Respiratory: Positive for cough.   Cardiovascular:  Negative for chest pain, palpitations and leg swelling.  Gastrointestinal: Negative for abdominal pain, diarrhea, nausea and vomiting.  Genitourinary: Negative for difficulty urinating, dysuria and vaginal discharge.  Musculoskeletal: Negative.   Skin: Negative.   Neurological: Negative.  Negative for dizziness, weakness and light-headedness.  Hematological: Negative for adenopathy. Does not bruise/bleed easily.  Psychiatric/Behavioral: Negative.     Objective:  BP 118/78 (BP Location: Left Arm, Patient Position: Sitting, Cuff Size: Normal)   Pulse 84   Temp 98.4 F (36.9 C) (Oral)   Resp 16   Ht 5\' 5"  (1.651 m)   Wt 199 lb (90.3 kg)   LMP 01/13/2018   SpO2 98%   BMI 33.12 kg/m   BP Readings from Last 3 Encounters:  02/06/18 118/78  01/30/18 120/82  01/22/18 113/78    Wt Readings from Last 3 Encounters:  02/06/18 199 lb (90.3 kg)  01/30/18 202 lb (91.6 kg)  01/22/18 201 lb (91.2 kg)    Physical Exam  Constitutional: She is oriented to person, place, and time. No distress.  HENT:  Nose: No mucosal edema or rhinorrhea. Right sinus exhibits no maxillary sinus tenderness and no frontal sinus tenderness. Left sinus exhibits no maxillary sinus tenderness and no frontal sinus tenderness.  Mouth/Throat: Oropharynx is clear and moist. No oropharyngeal exudate, posterior oropharyngeal edema, posterior oropharyngeal erythema or tonsillar abscesses.  Eyes: Conjunctivae are normal. No scleral icterus.  Neck: Normal range of motion. Neck supple. No JVD present. No thyromegaly present.  Cardiovascular:  Normal rate, regular rhythm and normal heart sounds.  Pulmonary/Chest: Effort normal and breath sounds normal. No respiratory distress. She has no wheezes. She has no rales.  Abdominal: Soft. Bowel sounds are normal. She exhibits no mass. There is no hepatosplenomegaly. There is no tenderness.  Musculoskeletal: Normal range of motion. She exhibits no edema, tenderness or deformity.    Lymphadenopathy:    She has no cervical adenopathy.  Neurological: She is alert and oriented to person, place, and time.  Skin: Skin is warm and dry. She is not diaphoretic. No pallor.  Vitals reviewed.   Lab Results  Component Value Date   WBC 8.7 10/31/2017   HGB 14.0 10/31/2017   HCT 41.2 10/31/2017   PLT 244.0 10/31/2017   GLUCOSE 76 10/31/2017   CHOL 170 10/31/2017   TRIG 75.0 10/31/2017   HDL 50.70 10/31/2017   LDLCALC 104 (H) 10/31/2017   ALT 10 10/31/2017   AST 15 10/31/2017   NA 140 10/31/2017   K 3.7 10/31/2017   CL 103 10/31/2017   CREATININE 0.87 10/31/2017   BUN 11 10/31/2017   CO2 30 10/31/2017   TSH 1.32 10/31/2017   HGBA1C 5.5 08/29/2014    Dg Chest 2 View  Result Date: 01/30/2018 CLINICAL DATA:  Pneumonia, cough EXAM: CHEST - 2 VIEW COMPARISON:  01/22/2018 FINDINGS: There is improvement in the left upper lobe patchy nodular mild airspace process. Minor residual streaky opacity in the left upper lobe may represent residual scarring/atelectasis. Normal heart size and vascularity. No other acute airspace process, collapse, consolidation, edema, effusion or pneumothorax. Trachea is midline. IMPRESSION: Improvement in the left upper lobe patchy nodular airspace process compatible with resolving pneumonia. Electronically Signed   By: Judie PetitM.  Shick M.D.   On: 01/30/2018 09:11    Assessment & Plan:   [name] was seen today for uri.  Diagnoses and all orders for this visit:  Mild intermittent asthmatic bronchitis without complication- Improvement noted, will cont the LABA/ICS inhaler  Pneumonia of both lungs due to infectious organism, unspecified part of lung- Improvement noted, she will complete the antibiotic course   I am having Christina M. Cairrikier Ignacia Palmaavidson maintain her VASCULERA, SUMAtriptan, fluocinonide cream, doxepin, Diclofenac, FLUoxetine, buPROPion, azithromycin, amoxicillin-clavulanate, and fluticasone furoate-vilanterol.  No orders of the  defined types were placed in this encounter.    Follow-up: No follow-ups on file.  Sanda Lingerhomas Abby Stines, MD

## 2018-02-07 ENCOUNTER — Encounter: Payer: Self-pay | Admitting: Internal Medicine

## 2018-02-07 NOTE — Patient Instructions (Signed)

## 2018-03-30 ENCOUNTER — Encounter (INDEPENDENT_AMBULATORY_CARE_PROVIDER_SITE_OTHER): Payer: Self-pay

## 2018-04-20 ENCOUNTER — Encounter (INDEPENDENT_AMBULATORY_CARE_PROVIDER_SITE_OTHER): Payer: Self-pay

## 2018-04-24 ENCOUNTER — Ambulatory Visit (INDEPENDENT_AMBULATORY_CARE_PROVIDER_SITE_OTHER): Payer: 59 | Admitting: Family Medicine

## 2018-04-24 ENCOUNTER — Encounter (INDEPENDENT_AMBULATORY_CARE_PROVIDER_SITE_OTHER): Payer: Self-pay | Admitting: Family Medicine

## 2018-04-24 VITALS — BP 105/69 | HR 62 | Temp 98.1°F | Ht 66.0 in | Wt 191.0 lb

## 2018-04-24 DIAGNOSIS — F339 Major depressive disorder, recurrent, unspecified: Secondary | ICD-10-CM

## 2018-04-24 DIAGNOSIS — Z6830 Body mass index (BMI) 30.0-30.9, adult: Secondary | ICD-10-CM | POA: Diagnosis not present

## 2018-04-24 DIAGNOSIS — E669 Obesity, unspecified: Secondary | ICD-10-CM

## 2018-04-24 DIAGNOSIS — R5383 Other fatigue: Secondary | ICD-10-CM

## 2018-04-24 DIAGNOSIS — R739 Hyperglycemia, unspecified: Secondary | ICD-10-CM | POA: Diagnosis not present

## 2018-04-24 DIAGNOSIS — R0602 Shortness of breath: Secondary | ICD-10-CM | POA: Diagnosis not present

## 2018-04-24 DIAGNOSIS — Z9189 Other specified personal risk factors, not elsewhere classified: Secondary | ICD-10-CM | POA: Diagnosis not present

## 2018-04-24 DIAGNOSIS — Z0289 Encounter for other administrative examinations: Secondary | ICD-10-CM

## 2018-04-25 LAB — COMPREHENSIVE METABOLIC PANEL
A/G RATIO: 2 (ref 1.2–2.2)
ALBUMIN: 4.5 g/dL (ref 3.8–4.8)
ALT: 12 IU/L (ref 0–32)
AST: 14 IU/L (ref 0–40)
Alkaline Phosphatase: 55 IU/L (ref 39–117)
BUN/Creatinine Ratio: 15 (ref 9–23)
BUN: 11 mg/dL (ref 6–20)
Bilirubin Total: 0.4 mg/dL (ref 0.0–1.2)
CO2: 25 mmol/L (ref 20–29)
Calcium: 9.1 mg/dL (ref 8.7–10.2)
Chloride: 102 mmol/L (ref 96–106)
Creatinine, Ser: 0.73 mg/dL (ref 0.57–1.00)
GFR calc non Af Amer: 106 mL/min/{1.73_m2} (ref >59)
GFR, EST AFRICAN AMERICAN: 122 mL/min/{1.73_m2} (ref >59)
Globulin, Total: 2.3 g/dL (ref 1.5–4.5)
Glucose: 92 mg/dL (ref 65–99)
POTASSIUM: 4.4 mmol/L (ref 3.5–5.2)
Sodium: 139 mmol/L (ref 134–144)
TOTAL PROTEIN: 6.8 g/dL (ref 6.0–8.5)

## 2018-04-25 LAB — LIPID PANEL WITH LDL/HDL RATIO
Cholesterol, Total: 168 mg/dL (ref 100–199)
HDL: 54 mg/dL (ref >39)
LDL Calculated: 103 mg/dL — ABNORMAL HIGH (ref 0–99)
LDl/HDL Ratio: 1.9 ratio (ref 0.0–3.2)
Triglycerides: 54 mg/dL (ref 0–149)
VLDL Cholesterol Cal: 11 mg/dL (ref 5–40)

## 2018-04-25 LAB — FOLATE: Folate: 13.8 ng/mL (ref >3.0)

## 2018-04-25 LAB — CBC WITH DIFFERENTIAL
BASOS: 0 %
Basophils Absolute: 0 10*3/uL (ref 0.0–0.2)
EOS (ABSOLUTE): 0.2 10*3/uL (ref 0.0–0.4)
EOS: 2 %
HEMATOCRIT: 42 % (ref 34.0–46.6)
HEMOGLOBIN: 14 g/dL (ref 11.1–15.9)
IMMATURE GRANS (ABS): 0 10*3/uL (ref 0.0–0.1)
Immature Granulocytes: 0 %
LYMPHS: 22 %
Lymphocytes Absolute: 2 10*3/uL (ref 0.7–3.1)
MCH: 30.2 pg (ref 26.6–33.0)
MCHC: 33.3 g/dL (ref 31.5–35.7)
MCV: 91 fL (ref 79–97)
MONOCYTES: 6 %
Monocytes Absolute: 0.6 10*3/uL (ref 0.1–0.9)
NEUTROS ABS: 6.4 10*3/uL (ref 1.4–7.0)
NEUTROS PCT: 70 %
RBC: 4.63 x10E6/uL (ref 3.77–5.28)
RDW: 12.9 % (ref 11.7–15.4)
WBC: 9.2 10*3/uL (ref 3.4–10.8)

## 2018-04-25 LAB — HEMOGLOBIN A1C
Est. average glucose Bld gHb Est-mCnc: 111 mg/dL
Hgb A1c MFr Bld: 5.5 % (ref 4.8–5.6)

## 2018-04-25 LAB — T3: T3, Total: 121 ng/dL (ref 71–180)

## 2018-04-25 LAB — T4, FREE: Free T4: 1.18 ng/dL (ref 0.82–1.77)

## 2018-04-25 LAB — TSH: TSH: 1.03 u[IU]/mL (ref 0.450–4.500)

## 2018-04-25 LAB — VITAMIN D 25 HYDROXY (VIT D DEFICIENCY, FRACTURES): Vit D, 25-Hydroxy: 32.2 ng/mL (ref 30.0–100.0)

## 2018-04-25 LAB — INSULIN, RANDOM: INSULIN: 6.7 u[IU]/mL (ref 2.6–24.9)

## 2018-04-25 LAB — VITAMIN B12: Vitamin B-12: 570 pg/mL (ref 232–1245)

## 2018-04-26 ENCOUNTER — Other Ambulatory Visit: Payer: Self-pay | Admitting: Family

## 2018-04-26 MED ORDER — OSELTAMIVIR PHOSPHATE 75 MG PO CAPS: 75.0000 mg | ORAL_CAPSULE | Freq: Two times a day (BID) | ORAL | 0 refills | Status: DC

## 2018-04-26 MED FILL — OSELTAMIVIR PHOSPHATE 75 MG: 75 | 5 days supply | Qty: 10 | Fill #0

## 2018-04-26 NOTE — Progress Notes (Signed)
.  Office: [phone]  /  Fax: [phone]   HPI:   Chief Complaint: OBESITY  Christina [name] (MR# [id number]) is a 38 y.o. female who presents on 04/24/2018 for obesity evaluation and treatment. Current BMI is Body mass index is 30.83 kg/m.Marland Kitchen. Christina [name] has struggled with obesity for years and has been unsuccessful in either losing weight or maintaining long term weight loss. Christina [name] attended our information session and states she is currently in the action stage of change and ready to dedicate time achieving and maintaining a healthier weight.   Christina [name] heard about our clinic from friends.  Christina [name] states her family eats meals together she thinks her family will eat healthier with  her her desired weight loss is 31 lbs she has been heavy most of  her life she started gaining weight after pregnancy her heaviest weight ever was 215 lbs she has significant food cravings issues  she skips meals frequently she is frequently drinking liquids with calories she frequently eats larger portions than normal    Christina [name] feels her energy is lower than it should be. This has worsened with weight gain and has not worsened recently. Christina [name] admits to daytime somnolence and  admits to waking up still tired. Patient is at risk for obstructive sleep apnea. Patent has a history of symptoms of daytime Christina. Patient generally gets 6 hours of sleep per night, and states they generally have nightime awakenings. Snoring is not present. Apneic episodes are not present. Epworth Sleepiness Score is 11.  Dyspnea on exertion Christina [name] notes increasing shortness of breath with exercising and seems to be worsening over time with weight gain. She notes getting out of breath sooner with activity than she used to. This has not gotten worse recently. EKG-Normal sinus rhythm. Christina [name] denies orthopnea.  Major Depressive Disorder Christina [name] has major depressive disorder. She was  previously on Cymbalta, Prozac, and Wellbutrin, but she is now only on Wellbutrin.  Hyperglycemia Christina [name] has a history of some elevated blood glucose on readings without a diagnosis of diabetes, approximately 3 years ago.   At risk for diabetes Christina [name] is at higher than average risk for developing diabetes due to her obesity and hyperglycemia. She currently denies polyuria or polydipsia.  Depression Screen Christina [name] Food and Mood (modified PHQ-9) score was  Depression screen PHQ 2/9 04/24/2018  Decreased Interest 1  Down, Depressed, Hopeless 1  PHQ - 2 Score 2  Altered sleeping 1  Tired, decreased energy 2  Change in appetite 0  Feeling bad or failure about yourself  1  Trouble concentrating 0  Moving slowly or fidgety/restless 0  Suicidal thoughts 0  PHQ-9 Score 6  Difficult doing work/chores Not difficult at all    ASSESSMENT AND PLAN:  Other Christina - Plan: EKG 12-Lead, Vitamin B12, CBC With Differential, Folate, T3, T4, free, TSH, VITAMIN D 25 Hydroxy (Vit-D Deficiency, Fractures)  Shortness of breath on exertion - Plan: Lipid Panel With LDL/HDL Ratio  Hyperglycemia - Plan: Comprehensive metabolic panel, Insulin, random, Hemoglobin A1c  Episode of recurrent major depressive disorder, unspecified depression episode severity (HCC)  At risk for diabetes mellitus  Class 1 obesity with serious comorbidity and body mass index (BMI) of 30.0 to 30.9 in adult, unspecified obesity type  PLAN:  Christina [name] was informed that her Christina may be related to obesity, depression or many other causes. Labs will be ordered, and in the meanwhile Christina [name] has agreed to work on diet, [name]  Proper sleep hygiene was discussed including the need for 7-8 hours of quality sleep each night. A sleep study was not ordered based on symptoms and Epworth score.  Dyspnea on exertion Christina [name]'s shortness of breath appears to be obesity related and  exercise induced. She has agreed to work on weight loss and gradually increase exercise to treat her exercise induced shortness of breath. If Christina [name] follows our instructions and loses weight without improvement of her shortness of breath, we will plan to refer to pulmonology. We will monitor this condition regularly. Christina [name] agrees to this plan.  Major Depressive Disorder Christina [name] will follow up with Dr. Yetta Barre, and she agrees to follow up with our clinic in 2 weeks.  Hyperglycemia Hgb A1c and insulin will be obtained today and results with be discussed with Christina [name] in 2 weeks at her follow up visit. In the meanwhile Christina [name] was started on a lower simple carbohydrate diet and will work on weight loss efforts.  Diabetes risk counselling Christina [name] was given extended (15 minutes) diabetes prevention counseling today. She is 38 y.o. female and has risk factors for diabetes including obesity and hyperglycemia. We discussed intensive lifestyle modifications today with an emphasis on weight loss as well as increasing exercise and decreasing simple carbohydrates in her diet.  Depression Screen Christina [name] had a mildly positive depression screening. Depression is commonly associated with obesity and often results in emotional eating behaviors. We will monitor this closely and work on CBT to help improve the non-hunger eating patterns. Referral to Psychology may be required if no improvement is seen as she continues in our clinic.  Obesity Christina [name] is currently in the action stage of change and her goal is to continue with weight loss efforts She has agreed to follow the Category 2 plan + 100 calories Christina [name] has been instructed to work up to a goal of 150 minutes of combined cardio and strengthening exercise per week for weight loss and overall health benefits. We discussed the following Behavioral Modification Strategies today: increasing lean protein intake, increasing vegetables, work on meal  planning and easy cooking plans, and planning for success  Christina [name] has agreed to follow up with our clinic in 2 weeks. She was informed of the importance of frequent follow up visits to maximize her success with intensive lifestyle modifications for her multiple health conditions. She was informed we would discuss her lab results at her next visit unless there is a critical issue that needs to be addressed sooner. Christina [name] agreed to keep her next visit at the agreed upon time to discuss these results.  ALLERGIES: Allergies  Allergen Reactions  . Hydrocodone Itching  . Prednisone Other (See Comments)    Hyper, agitated, aggressive    MEDICATIONS: Current Outpatient Medications on File Prior to Visit  Medication Sig Dispense Refill  . buPROPion (WELLBUTRIN XL) 150 MG 24 hr tablet Take 1 tablet (150 mg total) by mouth daily. 90 tablet 1  . Diclofenac (ZORVOLEX) 35 MG CAPS Take 1 capsule by mouth 3 (three) times daily with meals as needed. 90 capsule 3  . fluocinonide cream (LIDEX) 0.05 % Apply 1 application topically 2 (two) times daily. 60 g 2  . SUMAtriptan (IMITREX) 100 MG tablet Take 1 tablet (100 mg total) by mouth every 2 (two) hours as needed for migraine. May repeat in 2 hours if headache persists or recurs. 10 tablet 5  . Dietary Management Product (VASCULERA) TABS Take 1 capsule by mouth daily. 30 tablet 11   No current facility-administered medications  on file prior to visit.     PAST MEDICAL HISTORY: Past Medical History:  Diagnosis Date  . Back pain   . Eczema   . Major depressive disorder   . Migraine   . Normal pregnancy 03/11/2012  . Ovarian cyst   . Postpartum hemorrhage   . SVD (spontaneous vaginal delivery) 03/11/2012    PAST SURGICAL HISTORY: History reviewed. No pertinent surgical history.  SOCIAL HISTORY: Social History   Tobacco Use  . Smoking status: Current Every Day Smoker    Packs/day: 0.50    Years: 20.00    Pack years: 10.00    Types:  Cigarettes  . Smokeless tobacco: Never Used  Substance Use Topics  . Alcohol use: Yes    Alcohol/week: 0.0 standard drinks    Comment: occassionally for social functions  . Drug use: No    FAMILY HISTORY: Family History  Problem Relation Age of Onset  . Arthritis Father   . Sudden death Father [id number]       Sinus Cancer  . Cancer Father        nasal cancer  . Bipolar disorder Mother   . Depression Mother   . Anxiety disorder Mother   . Alcohol abuse Mother   . Drug abuse Mother   . Obesity Mother   . Sudden death Brother        GSW  . Arthritis Maternal Grandfather   . Hyperlipidemia Maternal Grandfather   . Heart disease Maternal Grandfather   . Breast cancer Maternal Grandmother   . Arthritis Maternal Grandmother   . Lung cancer Maternal Grandmother   . Heart disease Maternal Grandmother   . Hyperlipidemia Paternal Grandmother   . Stroke Paternal Grandfather     ROS: Review of Systems  Constitutional: Positive for malaise/Christina. Negative for weight loss.  HENT:       + Nasal stuffiness  Eyes:       + Wear glasses or contacts  Respiratory: Positive for cough and shortness of breath (with exertion).   Cardiovascular: Negative for orthopnea.  Genitourinary: Negative for frequency.  Endo/Heme/Allergies: Negative for polydipsia.       Negative hypoglycemia  Psychiatric/Behavioral: Positive for depression. Negative for suicidal ideas.    PHYSICAL EXAM: Blood pressure 105/69, pulse 62, temperature 98.1 F (36.7 C), temperature source Oral, height 5\' 6"  (1.676 m), weight 191 lb (86.6 kg), last menstrual period 04/23/2018, SpO2 98 %. Body mass index is 30.83 kg/m. Physical Exam Vitals signs reviewed.  Constitutional:      Appearance: Normal appearance. She is obese.  HENT:     Head: Normocephalic and atraumatic.     Nose: Nose normal.  Eyes:     General: No scleral icterus.    Extraocular Movements: Extraocular movements intact.  Neck:     Musculoskeletal:  Normal range of motion and neck supple.     Comments: No thyromegaly present Cardiovascular:     Rate and Rhythm: Normal rate and regular rhythm.     Pulses: Normal pulses.     Heart sounds: Normal heart sounds.  Pulmonary:     Effort: Pulmonary effort is normal. No respiratory distress.     Breath sounds: Normal breath sounds.  Abdominal:     Palpations: Abdomen is soft.     Tenderness: There is no abdominal tenderness.     Comments: + Obesity  Musculoskeletal: Normal range of motion.     Right lower leg: No edema.     Left lower leg: No edema.  Skin:    General: Skin is warm and dry.  Neurological:     Mental Status: She is alert and oriented to person, place, and time.     Coordination: Coordination normal.  Psychiatric:        Mood and Affect: Mood normal.        Behavior: Behavior normal.     RECENT LABS AND TESTS: BMET    Component Value Date/Time   NA 139 04/24/2018 1413   K 4.4 04/24/2018 1413   CL 102 04/24/2018 1413   CO2 25 04/24/2018 1413   GLUCOSE 92 04/24/2018 1413   GLUCOSE 76 10/31/2017 1659   BUN 11 04/24/2018 1413   CREATININE 0.73 04/24/2018 1413   CALCIUM 9.1 04/24/2018 1413   GFRNONAA 106 04/24/2018 1413   GFRAA 122 04/24/2018 1413   Lab Results  Component Value Date   HGBA1C 5.5 04/24/2018   Lab Results  Component Value Date   INSULIN 6.7 04/24/2018   CBC    Component Value Date/Time   WBC 9.2 04/24/2018 1413   WBC 8.7 10/31/2017 1659   RBC 4.63 04/24/2018 1413   RBC 4.55 10/31/2017 1659   HGB 14.0 04/24/2018 1413   HCT 42.0 04/24/2018 1413   PLT 244.0 10/31/2017 1659   MCV 91 04/24/2018 1413   MCH 30.2 04/24/2018 1413   MCH 30.0 01/03/2014 1558   MCHC 33.3 04/24/2018 1413   MCHC 34.1 10/31/2017 1659   RDW 12.9 04/24/2018 1413   LYMPHSABS 2.0 04/24/2018 1413   MONOABS 0.6 10/31/2017 1659   EOSABS 0.2 04/24/2018 1413   BASOSABS 0.0 04/24/2018 1413   Iron/TIBC/Ferritin/ %Sat No results found for: IRON, TIBC, FERRITIN,  IRONPCTSAT Lipid Panel     Component Value Date/Time   CHOL 168 04/24/2018 1413   TRIG 54 04/24/2018 1413   HDL 54 04/24/2018 1413   CHOLHDL 3 10/31/2017 1418   VLDL 15.0 10/31/2017 1418   LDLCALC 103 (H) 04/24/2018 1413   Hepatic Function Panel     Component Value Date/Time   PROT 6.8 04/24/2018 1413   ALBUMIN 4.5 04/24/2018 1413   AST 14 04/24/2018 1413   ALT 12 04/24/2018 1413   ALKPHOS 55 04/24/2018 1413   BILITOT 0.4 04/24/2018 1413      Component Value Date/Time   TSH 1.030 04/24/2018 1413    ECG  shows NSR with a rate of 71 BPM INDIRECT CALORIMETER done today shows a VO2 of 293 and a REE of 2036. Her calculated basal metabolic rate is 5784 thus her basal metabolic rate is better than expected.       OBESITY BEHAVIORAL INTERVENTION VISIT  Today's visit was # 1   Starting weight: 191 lbs Starting date: 04/24/2018 Today's weight : 191 lbs Today's date: 04/24/2018 Total lbs lost to date: 0    ASK: We discussed the diagnosis of obesity with Christina Garbe M Cairrikier Christina [name] today and Christina [name] agreed to give [name] permission to discuss obesity behavioral modification therapy today.  ASSESS: Josilyn has the diagnosis of obesity and her BMI today is 30.84 Christina [name] is in the action stage of change   ADVISE: Zakhia was educated on the multiple health risks of obesity as well as the benefit of weight loss to improve her health. She was advised of the need for long term treatment and the importance of lifestyle modifications to improve her current health and to decrease her risk of future health problems.  AGREE: Multiple dietary modification options and treatment options were discussed and  Quynn agreed to follow the recommendations documented in the above note.  ARRANGE: Erlean was educated on the importance of frequent visits to treat obesity as outlined per CMS and USPSTF guidelines and agreed to schedule her next follow up appointment today.   I,  Burt Knack, am acting as transcriptionist for Debbra Riding, MD   I have reviewed the above documentation for accuracy and completeness, and I agree with the above. - Debbra Riding, MD

## 2018-05-01 ENCOUNTER — Encounter: Payer: Self-pay | Admitting: Internal Medicine

## 2018-05-01 ENCOUNTER — Ambulatory Visit (INDEPENDENT_AMBULATORY_CARE_PROVIDER_SITE_OTHER): Payer: 59 | Admitting: Internal Medicine

## 2018-05-01 VITALS — BP 112/78 | HR 70 | Temp 98.1°F | Ht 66.0 in | Wt 199.0 lb

## 2018-05-01 DIAGNOSIS — R6889 Other general symptoms and signs: Secondary | ICD-10-CM | POA: Diagnosis not present

## 2018-05-01 DIAGNOSIS — J111 Influenza due to unidentified influenza virus with other respiratory manifestations: Secondary | ICD-10-CM | POA: Diagnosis not present

## 2018-05-01 LAB — POC INFLUENZA A&B (BINAX/QUICKVUE)
Influenza A, POC: NEGATIVE
Influenza B, POC: NEGATIVE

## 2018-05-01 NOTE — Progress Notes (Signed)
   Subjective:  Patient ID: Christina [name], female    DOB: [birth year]  Age: 38 y.o. MRN: [id number]  CC: URI   HPI Christina [name] presents for URI - not seen  Outpatient Medications Prior to Visit  Medication Sig Dispense Refill  . buPROPion (WELLBUTRIN XL) 150 MG 24 hr tablet Take 1 tablet (150 mg total) by mouth daily. 90 tablet 1  . Diclofenac (ZORVOLEX) 35 MG CAPS Take 1 capsule by mouth 3 (three) times daily with meals as needed. 90 capsule 3  . Dietary Management Product (VASCULERA) TABS Take 1 capsule by mouth daily. 30 tablet 11  . fluocinonide cream (LIDEX) 0.05 % Apply 1 application topically 2 (two) times daily. 60 g 2  . oseltamivir (TAMIFLU) 75 MG capsule Take 1 capsule (75 mg total) by mouth 2 (two) times daily. 10 capsule 0  . SUMAtriptan (IMITREX) 100 MG tablet Take 1 tablet (100 mg total) by mouth every 2 (two) hours as needed for migraine. May repeat in 2 hours if headache persists or recurs. 10 tablet 5   No facility-administered medications prior to visit.     ROS Review of Systems  Objective:  BP 112/78 (BP Location: Left Arm, Patient Position: Sitting, Cuff Size: Normal)   Pulse 70   Temp 98.1 F (36.7 C) (Oral)   Ht 5\' 6"  (1.676 m)   Wt 199 lb (90.3 kg)   LMP 04/23/2018   SpO2 95%   BMI 32.12 kg/m   BP Readings from Last 3 Encounters:  05/01/18 112/78  04/24/18 105/69  02/06/18 118/78    Wt Readings from Last 3 Encounters:  05/01/18 199 lb (90.3 kg)  04/24/18 191 lb (86.6 kg)  02/06/18 199 lb (90.3 kg)    Physical Exam  Lab Results  Component Value Date   WBC 9.2 04/24/2018   HGB 14.0 04/24/2018   HCT 42.0 04/24/2018   PLT 244.0 10/31/2017   GLUCOSE 92 04/24/2018   CHOL 168 04/24/2018   TRIG 54 04/24/2018   HDL 54 04/24/2018   LDLCALC 103 (H) 04/24/2018   ALT 12 04/24/2018   AST 14 04/24/2018   NA 139 04/24/2018   K 4.4 04/24/2018   CL 102 04/24/2018   CREATININE 0.73 04/24/2018   BUN 11  04/24/2018   CO2 25 04/24/2018   TSH 1.030 04/24/2018   HGBA1C 5.5 04/24/2018    Dg Chest 2 View  Result Date: 01/30/2018 CLINICAL DATA:  Pneumonia, cough EXAM: CHEST - 2 VIEW COMPARISON:  01/22/2018 FINDINGS: There is improvement in the left upper lobe patchy nodular mild airspace process. Minor residual streaky opacity in the left upper lobe may represent residual scarring/atelectasis. Normal heart size and vascularity. No other acute airspace process, collapse, consolidation, edema, effusion or pneumothorax. Trachea is midline. IMPRESSION: Improvement in the left upper lobe patchy nodular airspace process compatible with resolving pneumonia. Electronically Signed   By: Judie Petit.  Shick M.D.   On: 01/30/2018 09:11    Assessment & Plan:   Christina [name] was seen today for uri.  Diagnoses and all orders for this visit:  Flu-like symptoms -     POC Influenza A&B (Binax test)   I am having Christina [name] maintain her VASCULERA, SUMAtriptan, fluocinonide cream, Diclofenac, buPROPion, and oseltamivir.  No orders of the defined types were placed in this encounter.    Follow-up: No follow-ups on file.  Sanda Linger, MD

## 2018-05-04 ENCOUNTER — Ambulatory Visit: Payer: 59 | Admitting: Family Medicine

## 2018-05-04 ENCOUNTER — Encounter: Payer: Self-pay | Admitting: Family Medicine

## 2018-05-04 DIAGNOSIS — M533 Sacrococcygeal disorders, not elsewhere classified: Secondary | ICD-10-CM | POA: Diagnosis not present

## 2018-05-04 NOTE — Patient Instructions (Signed)
Good to see you  Ice is your friend  Muscle relaxer at night See me again when you need me

## 2018-05-04 NOTE — Progress Notes (Signed)
[facility] [address] [place], [place] [address] Phone: [phone] Subjective:    I'm seeing this patient by the request  of:    CC: Neck and back pain.  ION:[id number]  Nesiah M [name] is a 38 y.o. female coming in with complaint of low back pain.  Has been working on a more regular basis.  Discussed dull, throbbing aching pain that seems to last not be unrelenting.  Patient does not remember exactly what she does.  Has been able to work throughout though.      Past Medical History:  Diagnosis Date  . Back pain   . Eczema   . Major depressive disorder   . Migraine   . Normal pregnancy 03/11/2012  . Ovarian cyst   . Postpartum hemorrhage   . SVD (spontaneous vaginal delivery) 03/11/2012   No past surgical history on file. Social History   Socioeconomic History  . Marital status: Married    Spouse name: Sheria Lang  . Number of children: 2  . Years of education: Not on file  . Highest education level: Not on file  Occupational History  . Occupation: cma     Employer: [employer]  Social Needs  . Financial resource strain: Not on file  . Food insecurity:    Worry: Not on file    Inability: Not on file  . Transportation needs:    Medical: Not on file    Non-medical: Not on file  Tobacco Use  . Smoking status: Current Every Day Smoker    Packs/day: 0.50    Years: 20.00    Pack years: 10.00    Types: Cigarettes  . Smokeless tobacco: Never Used  Substance and Sexual Activity  . Alcohol use: Yes    Alcohol/week: 0.0 standard drinks    Comment: occassionally for social functions  . Drug use: No  . Sexual activity: Yes    Partners: Male  Lifestyle  . Physical activity:    Days per week: Not on file    Minutes per session: Not on file  . Stress: Not on file  Relationships  . Social connections:    Talks on phone: Not on file    Gets together: Not on file    Attends religious service: Not on file    Active  member of club or organization: Not on file    Attends meetings of clubs or organizations: Not on file    Relationship status: Not on file  Other Topics Concern  . Not on file  Social History Narrative  . Not on file   Allergies  Allergen Reactions  . Hydrocodone Itching  . Prednisone Other (See Comments)    Hyper, agitated, aggressive   Family History  Problem Relation Age of Onset  . Arthritis Father   . Sudden death Father 47       Sinus Cancer  . Cancer Father        nasal cancer  . Bipolar disorder Mother   . Depression Mother   . Anxiety disorder Mother   . Alcohol abuse Mother   . Drug abuse Mother   . Obesity Mother   . Sudden death Brother        GSW  . Arthritis Maternal Grandfather   . Hyperlipidemia Maternal Grandfather   . Heart disease Maternal Grandfather   . Breast cancer Maternal Grandmother   . Arthritis Maternal Grandmother   . Lung cancer Maternal Grandmother   . Heart  disease Maternal Grandmother   . Hyperlipidemia Paternal Grandmother   . Stroke Paternal Grandfather        Current Outpatient Medications (Analgesics):  Marland Kitchen  Diclofenac (ZORVOLEX) 35 MG CAPS, Take 1 capsule by mouth 3 (three) times daily with meals as needed. .  SUMAtriptan (IMITREX) 100 MG tablet, Take 1 tablet (100 mg total) by mouth every 2 (two) hours as needed for migraine. May repeat in 2 hours if headache persists or recurs.   Current Outpatient Medications (Other):  Marland Kitchen  buPROPion (WELLBUTRIN XL) 150 MG 24 hr tablet, Take 1 tablet (150 mg total) by mouth daily. .  Dietary Management Product (VASCULERA) TABS, Take 1 capsule by mouth daily. .  fluocinonide cream (LIDEX) 0.05 %, Apply 1 application topically 2 (two) times daily. Marland Kitchen  oseltamivir (TAMIFLU) 75 MG capsule, Take 1 capsule (75 mg total) by mouth 2 (two) times daily.    Past medical history, social, surgical and family history all reviewed in electronic medical record.  No pertanent information unless stated  regarding to the chief complaint.   Review of Systems:  No headache, visual changes, nausea, vomiting, diarrhea, constipation, dizziness, abdominal pain, skin rash, fevers, chills, night sweats, weight loss, swollen lymph nodes, body aches, joint swelling,  chest pain, shortness of breath, mood changes.  Positive muscle aches  Objective  Height 5\' 6"  (1.676 m), weight 199 lb (90.3 kg), last menstrual period 04/23/2018.    General: No apparent distress alert and oriented x3 mood and affect normal, dressed appropriately.  HEENT: Pupils equal, extraocular movements intact  Respiratory: Patient's speak in full sentences and does not appear short of breath  Cardiovascular: No lower extremity edema, non tender, no erythema  Skin: Warm dry intact with no signs of infection or rash on extremities or on axial skeleton.  Abdomen: Soft nontender  Neuro: Cranial nerves II through XII are intact, neurovascularly intact in all extremities with 2+ DTRs and 2+ pulses.  Lymph: No lymphadenopathy of posterior or anterior cervical chain or axillae bilaterally.  Gait normal with good balance and coordination.  MSK:  Non tender with full range of motion and good stability and symmetric strength and tone of shoulders, elbows, wrist, hip, knee and ankles bilaterally.  Back exam shows the patient does have some mild poor core strength.  Patient does have tenderness to palpation over the sacroiliac joint.  Negative straight leg test.  Tightness noted in the paraspinal musculature.  Neurovascular intact distally with 2+ DTRs    Impression and Recommendations:     This case required medical decision making of moderate complexity. The above documentation has been reviewed and is accurate and complete Antoine Primas.      Note: This dictation was prepared with Dragon dictation along with smaller phrase technology. Any transcriptional errors that result from this process are unintentional.

## 2018-05-04 NOTE — Assessment & Plan Note (Signed)
Mild dysfunction again.  I do believe that that patient has more of a gluteus medius spasm seems to be giving her some of the discomfort and pain as well.  Discussed icing regimen and home exercise.  Discussed which activities to do which wants to avoid.  Discussed posture and ergonomics.  Follow-up again in 4 to 8 weeks

## 2018-05-08 ENCOUNTER — Encounter (INDEPENDENT_AMBULATORY_CARE_PROVIDER_SITE_OTHER): Payer: Self-pay | Admitting: Family Medicine

## 2018-05-08 ENCOUNTER — Ambulatory Visit (INDEPENDENT_AMBULATORY_CARE_PROVIDER_SITE_OTHER): Payer: 59 | Admitting: Family Medicine

## 2018-05-08 VITALS — BP 116/72 | HR 96 | Temp 98.7°F | Ht 66.0 in | Wt 193.0 lb

## 2018-05-08 DIAGNOSIS — Z9189 Other specified personal risk factors, not elsewhere classified: Secondary | ICD-10-CM | POA: Diagnosis not present

## 2018-05-08 DIAGNOSIS — E8881 Metabolic syndrome: Secondary | ICD-10-CM

## 2018-05-08 DIAGNOSIS — E559 Vitamin D deficiency, unspecified: Secondary | ICD-10-CM

## 2018-05-08 DIAGNOSIS — Z6831 Body mass index (BMI) 31.0-31.9, adult: Secondary | ICD-10-CM

## 2018-05-08 DIAGNOSIS — E7849 Other hyperlipidemia: Secondary | ICD-10-CM

## 2018-05-08 DIAGNOSIS — E669 Obesity, unspecified: Secondary | ICD-10-CM | POA: Diagnosis not present

## 2018-05-08 MED ORDER — VITAMIN D (ERGOCALCIFEROL) 1.25 MG (50000 UNIT) PO CAPS: 50000.0000 [IU] | ORAL_CAPSULE | ORAL | 0 refills | Status: DC

## 2018-05-08 MED FILL — VIT D2 1.25 MG (50,000 UNIT: 1.25 MG | 28 days supply | Qty: 4 | Fill #0

## 2018-05-08 NOTE — Progress Notes (Signed)
Office: [phone]  /  Fax: [phone]   HPI:   Chief Complaint: OBESITY Christina [name] is here to discuss her progress with her obesity treatment plan. She is on the Category 2 plan + 100 calories and is following her eating plan approximately 90 % of the time. She states she is exercising 0 minutes 0 times per week. Christina [name] found the first two weeks to be challenging secondary to quantity of food. She followed the plan to almost 90%, getting all snacks in. She denies obstacles in the next 2 weeks.  Her weight is 193 lb (87.5 kg) today and has gained 2 pounds since her last visit. She has lost 0 lbs since starting treatment with [name].  Hyperlipidemia Christina [name] has hyperlipidemia and has been trying to improve her cholesterol levels with intensive lifestyle modification including a low saturated fat diet, exercise and weight loss. Her LDL is slightly elevated and HDL within normal limits. She denies any chest pain, claudication or myalgias.  Vitamin D Deficiency Christina [name] has a diagnosis of vitamin D deficiency. She is not on OTC Vit D. She notes fatigue and denies nausea, vomiting or muscle weakness.  At risk for osteopenia and osteoporosis Christina [name] is at higher risk of osteopenia and osteoporosis due to vitamin D deficiency.   Insulin Resistance [name] has a diagnosis of insulin resistance based on her elevated fasting insulin level >5. Insulin level of 6.7 and she notes carbohydrates cravings. Although Christina [name]'s blood glucose readings are still under good control, insulin resistance puts her at greater risk of metabolic syndrome and diabetes. She is not taking metformin currently and continues to work on diet and exercise to decrease risk of diabetes.  ASSESSMENT AND PLAN:  Other hyperlipidemia  Vitamin D deficiency - Plan: Vitamin D, Ergocalciferol, (DRISDOL) 1.25 MG (50000 UT) CAPS capsule  Insulin resistance  At risk for osteoporosis  Class 1 obesity with serious  comorbidity and body mass index (BMI) of 31.0 to 31.9 in adult, unspecified obesity type  PLAN:  Hyperlipidemia [name] was informed of the American Heart Association Guidelines emphasizing intensive lifestyle modifications as the first line treatment for hyperlipidemia. We discussed many lifestyle modifications today in depth, and [name] will continue to work on decreasing saturated fats such as fatty red meat, butter and many fried foods. She will also increase vegetables and lean protein in her diet and continue to work on exercise and weight loss efforts. We will repeat FLP in 3 months. [name] agrees to follow up with our clinic in 2 weeks.  Vitamin D Deficiency Christina [name] was informed that low vitamin D levels contributes to fatigue and are associated with obesity, breast, and colon cancer. Christina [name] agrees to start prescription Vit D @50 ,000 IU every week #4 with no refills. She will follow up for routine testing of vitamin D, at least 2-3 times per year. She was informed of the risk of over-replacement of vitamin D and agrees to not increase her dose unless she discusses this with [name] first. [name] agrees to follow up with our clinic in 2 weeks.  At risk for osteopenia and osteoporosis Christina [name] was given extended (30 minutes) osteoporosis prevention counseling today. Christina [name] is at risk for osteopenia and osteoporsis due to her vitamin D deficiency. She was encouraged to take her vitamin D and follow her higher calcium diet and increase strengthening exercise to help strengthen her bones and decrease her risk of osteopenia and osteoporosis.  Insulin Resistance Christina [name] will continue to work on weight loss, exercise, and decreasing simple carbohydrates  in her diet to help decrease the risk of diabetes. We dicussed metformin including benefits and risks. She was informed that eating too many simple carbohydrates or too many calories at one sitting increases the likelihood of GI side  effects. Christina [name] declined metformin for now and prescription was not written today. We will repeat insulin in 3 months. Christina [name] agrees to follow up with our clinic in 2 weeks as directed to monitor her progress.  Obesity Christina [name] is currently in the action stage of change. As such, her goal is to continue with weight loss efforts She has agreed to follow the Category 2 plan + 100 calories Christina [name] has been instructed to work up to a goal of 150 minutes of combined cardio and strengthening exercise per week for weight loss and overall health benefits. We discussed the following Behavioral Modification Strategies today: increasing lean protein intake, increasing vegetables, work on meal planning and easy cooking plans, better snacking choices, and planning for success   Christina [name] has agreed to follow up with our clinic in 2 weeks. She was informed of the importance of frequent follow up visits to maximize her success with intensive lifestyle modifications for her multiple health conditions.  ALLERGIES: Allergies  Allergen Reactions  . Hydrocodone Itching  . Prednisone Other (See Comments)    Hyper, agitated, aggressive    MEDICATIONS: Current Outpatient Medications on File Prior to Visit  Medication Sig Dispense Refill  . buPROPion (WELLBUTRIN XL) 150 MG 24 hr tablet Take 1 tablet (150 mg total) by mouth daily. 90 tablet 1  . Diclofenac (ZORVOLEX) 35 MG CAPS Take 1 capsule by mouth 3 (three) times daily with meals as needed. 90 capsule 3  . Dietary Management Product (VASCULERA) TABS Take 1 capsule by mouth daily. 30 tablet 11  . fluocinonide cream (LIDEX) 0.05 % Apply 1 application topically 2 (two) times daily. 60 g 2  . oseltamivir (TAMIFLU) 75 MG capsule Take 1 capsule (75 mg total) by mouth 2 (two) times daily. 10 capsule 0  . SUMAtriptan (IMITREX) 100 MG tablet Take 1 tablet (100 mg total) by mouth every 2 (two) hours as needed for migraine. May repeat in 2 hours if headache  persists or recurs. 10 tablet 5   No current facility-administered medications on file prior to visit.     PAST MEDICAL HISTORY: Past Medical History:  Diagnosis Date  . Back pain   . Eczema   . Major depressive disorder   . Migraine   . Normal pregnancy 03/11/2012  . Ovarian cyst   . Postpartum hemorrhage   . SVD (spontaneous vaginal delivery) 03/11/2012    PAST SURGICAL HISTORY: History reviewed. No pertinent surgical history.  SOCIAL HISTORY: Social History   Tobacco Use  . Smoking status: Current Every Day Smoker    Packs/day: 0.50    Years: 20.00    Pack years: 10.00    Types: Cigarettes  . Smokeless tobacco: Never Used  Substance Use Topics  . Alcohol use: Yes    Alcohol/week: 0.0 standard drinks    Comment: occassionally for social functions  . Drug use: No    FAMILY HISTORY: Family History  Problem Relation Age of Onset  . Arthritis Father   . Sudden death Father 51       Sinus Cancer  . Cancer Father        nasal cancer  . Bipolar disorder Mother   . Depression Mother   . Anxiety disorder Mother   . Alcohol abuse Mother   .  Drug abuse Mother   . Obesity Mother   . Sudden death Brother        GSW  . Arthritis Maternal Grandfather   . Hyperlipidemia Maternal Grandfather   . Heart disease Maternal Grandfather   . Breast cancer Maternal Grandmother   . Arthritis Maternal Grandmother   . Lung cancer Maternal Grandmother   . Heart disease Maternal Grandmother   . Hyperlipidemia Paternal Grandmother   . Stroke Paternal Grandfather     ROS: Review of Systems  Constitutional: Positive for malaise/fatigue. Negative for weight loss.  Cardiovascular: Negative for chest pain and claudication.  Gastrointestinal: Negative for nausea and vomiting.  Musculoskeletal: Negative for myalgias.       Negative muscle weakness    PHYSICAL EXAM: Blood pressure 116/72, pulse 96, temperature 98.7 F (37.1 C), temperature source Oral, height 5\' 6"  (1.676 m),  weight 193 lb (87.5 kg), last menstrual period 04/23/2018, SpO2 94 %. Body mass index is 31.15 kg/m. Physical Exam Vitals signs reviewed.  Constitutional:      Appearance: Normal appearance. She is obese.  Cardiovascular:     Rate and Rhythm: Normal rate.     Pulses: Normal pulses.  Pulmonary:     Effort: Pulmonary effort is normal.     Breath sounds: Normal breath sounds.  Musculoskeletal: Normal range of motion.  Skin:    General: Skin is warm and dry.  Neurological:     Mental Status: She is alert and oriented to person, place, and time.  Psychiatric:        Mood and Affect: Mood normal.        Behavior: Behavior normal.     RECENT LABS AND TESTS: BMET    Component Value Date/Time   NA 139 04/24/2018 1413   K 4.4 04/24/2018 1413   CL 102 04/24/2018 1413   CO2 25 04/24/2018 1413   GLUCOSE 92 04/24/2018 1413   GLUCOSE 76 10/31/2017 1659   BUN 11 04/24/2018 1413   CREATININE 0.73 04/24/2018 1413   CALCIUM 9.1 04/24/2018 1413   GFRNONAA 106 04/24/2018 1413   GFRAA 122 04/24/2018 1413   Lab Results  Component Value Date   HGBA1C 5.5 04/24/2018   HGBA1C 5.5 08/29/2014   Lab Results  Component Value Date   INSULIN 6.7 04/24/2018   CBC    Component Value Date/Time   WBC 9.2 04/24/2018 1413   WBC 8.7 10/31/2017 1659   RBC 4.63 04/24/2018 1413   RBC 4.55 10/31/2017 1659   HGB 14.0 04/24/2018 1413   HCT 42.0 04/24/2018 1413   PLT 244.0 10/31/2017 1659   MCV 91 04/24/2018 1413   MCH 30.2 04/24/2018 1413   MCH 30.0 01/03/2014 1558   MCHC 33.3 04/24/2018 1413   MCHC 34.1 10/31/2017 1659   RDW 12.9 04/24/2018 1413   LYMPHSABS 2.0 04/24/2018 1413   MONOABS 0.6 10/31/2017 1659   EOSABS 0.2 04/24/2018 1413   BASOSABS 0.0 04/24/2018 1413   Iron/TIBC/Ferritin/ %Sat No results found for: IRON, TIBC, FERRITIN, IRONPCTSAT Lipid Panel     Component Value Date/Time   CHOL 168 04/24/2018 1413   TRIG 54 04/24/2018 1413   HDL 54 04/24/2018 1413   CHOLHDL 3  10/31/2017 1418   VLDL 15.0 10/31/2017 1418   LDLCALC 103 (H) 04/24/2018 1413   Hepatic Function Panel     Component Value Date/Time   PROT 6.8 04/24/2018 1413   ALBUMIN 4.5 04/24/2018 1413   AST 14 04/24/2018 1413   ALT 12 04/24/2018 1413  ALKPHOS 55 04/24/2018 1413   BILITOT 0.4 04/24/2018 1413      Component Value Date/Time   TSH 1.030 04/24/2018 1413   TSH 1.32 10/31/2017 1659   TSH 1.27 10/12/2016 0830      OBESITY BEHAVIORAL INTERVENTION VISIT  Today's visit was # 2   Starting weight: 191 lbs Starting date: 04/24/2018 Today's weight : 193 lbs Today's date: 05/08/2018 Total lbs lost to date: 0    ASK: We discussed the diagnosis of obesity with Thurnell Garbe M [name] today and [facility] agreed to give [name] permission to discuss obesity behavioral modification therapy today.  ASSESS: Shalon has the diagnosis of obesity and her BMI today is 31.17 [place] is in the action stage of change   ADVISE: Laylanie was educated on the multiple health risks of obesity as well as the benefit of weight loss to improve her health. She was advised of the need for long term treatment and the importance of lifestyle modifications to improve her current health and to decrease her risk of future health problems.  AGREE: Multiple dietary modification options and treatment options were discussed and  Seanne agreed to follow the recommendations documented in the above note.  ARRANGE: Avamae was educated on the importance of frequent visits to treat obesity as outlined per CMS and USPSTF guidelines and agreed to schedule her next follow up appointment today.  I, Burt Knack, am acting as transcriptionist for Debbra Riding, MD  I have reviewed the above documentation for accuracy and completeness, and I agree with the above. - Debbra Riding, MD

## 2018-05-22 ENCOUNTER — Other Ambulatory Visit: Payer: Self-pay | Admitting: Internal Medicine

## 2018-05-22 ENCOUNTER — Encounter: Payer: Self-pay | Admitting: Internal Medicine

## 2018-05-22 DIAGNOSIS — M5136 Other intervertebral disc degeneration, lumbar region: Secondary | ICD-10-CM

## 2018-05-22 MED ORDER — DICLOFENAC SODIUM 50 MG PO TBEC: 50.0000 mg | DELAYED_RELEASE_TABLET | Freq: Two times a day (BID) | ORAL | 1 refills | Status: DC

## 2018-05-22 MED FILL — DICLOFENAC SOD EC 50 MG TAB: 50 | 90 days supply | Qty: 180 | Fill #0

## 2018-05-23 ENCOUNTER — Encounter (INDEPENDENT_AMBULATORY_CARE_PROVIDER_SITE_OTHER): Payer: Self-pay | Admitting: Family Medicine

## 2018-05-23 ENCOUNTER — Ambulatory Visit (INDEPENDENT_AMBULATORY_CARE_PROVIDER_SITE_OTHER): Payer: 59 | Admitting: Family Medicine

## 2018-05-23 VITALS — BP 102/68 | HR 74 | Temp 98.2°F | Ht 66.0 in | Wt 191.0 lb

## 2018-05-23 DIAGNOSIS — E669 Obesity, unspecified: Secondary | ICD-10-CM

## 2018-05-23 DIAGNOSIS — E559 Vitamin D deficiency, unspecified: Secondary | ICD-10-CM

## 2018-05-23 DIAGNOSIS — E7849 Other hyperlipidemia: Secondary | ICD-10-CM

## 2018-05-23 DIAGNOSIS — Z6830 Body mass index (BMI) 30.0-30.9, adult: Secondary | ICD-10-CM | POA: Diagnosis not present

## 2018-05-23 NOTE — Progress Notes (Signed)
Office: [phone]  /  Fax: [phone]   HPI:   Chief Complaint: OBESITY Christina [name] is here to discuss her progress with her obesity treatment plan. She is on the Category 2 plan + 100 calories and is following her eating plan approximately 90 % of the time. She states she is exercising 0 minutes 0 times per week. Christina [name] has found weekends to be slightly harder to get all food in versus weekdays. 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 She is getting in at least 6 oz at dinner.  Her weight is 191 lb (86.6 kg) today and has had a weight loss of 2 pounds over a period of 2 weeks since her last visit. She has lost 0 lbs since starting treatment with [name].  Vitamin D Deficiency Christina [name] has a diagnosis of vitamin D deficiency. She is currently taking prescription Vit D. She notes fatigue and denies nausea, vomiting or muscle weakness.  Hyperlipidemia Christina [name] has hyperlipidemia and has been trying to improve her cholesterol levels with intensive lifestyle modification including a low saturated fat diet, exercise and weight loss. Her LDL is slightly elevated. She is not on statin and denies any chest pain, claudication or myalgias.  ASSESSMENT AND PLAN:  Vitamin D deficiency  Other hyperlipidemia  Class 1 obesity with serious comorbidity and body mass index (BMI) of 30.0 to 30.9 in adult, unspecified obesity type  PLAN:  Vitamin D Deficiency Christina [name] was informed that low vitamin D levels contributes to fatigue and are associated with obesity, breast, and colon cancer. Christina [name] agrees to continue taking prescription Vit D @50 ,000 IU every week and will follow up for routine testing of vitamin D, at least 2-3 times per year. She was informed of the risk of over-replacement of vitamin D and agrees to not increase her dose unless she discusses this with [name] first. Christina [name] agrees to follow up with our clinic in 2 weeks.  Hyperlipidemia Christina [name] was informed of the  American Heart Association Guidelines emphasizing intensive lifestyle modifications as the first line treatment for hyperlipidemia. We discussed many lifestyle modifications today in depth, and Christina [name] will continue to work on decreasing saturated fats such as fatty red meat, butter and many fried foods. She will also increase vegetables and lean protein in her diet and continue to work on exercise and weight loss efforts. We will retest FLP in 2 and a half months. Christina [name] agrees to follow up with our clinic in 2 weeks.  I spent > than 50% of the 15 minute visit on counseling as documented in the note.  Obesity Christina [name] is currently in the action stage of change. As such, her goal is to continue with weight loss efforts She has agreed to follow the Category 2 plan + 100 calories Christina [name] has been instructed to work up to a goal of 150 minutes of combined cardio and strengthening exercise per week for weight loss and overall health benefits. We discussed the following Behavioral Modification Strategies today: increasing lean protein intake, increasing vegetables and work on meal planning and easy cooking plans, and planning for success   Christina [name] has agreed to follow up with our clinic in 2 weeks. She was informed of the importance of frequent follow up visits to maximize her success with intensive lifestyle modifications for her multiple health conditions.  ALLERGIES: Allergies  Allergen Reactions  . Hydrocodone Itching  . Prednisone Other (See Comments)    Hyper, agitated, aggressive    MEDICATIONS: Current Outpatient Medications on  File Prior to Visit  Medication Sig Dispense Refill  . buPROPion (WELLBUTRIN XL) 150 MG 24 hr tablet Take 1 tablet (150 mg total) by mouth daily. 90 tablet 1  . diclofenac (VOLTAREN) 50 MG EC tablet Take 1 tablet (50 mg total) by mouth 2 (two) times daily. 180 tablet 1  . Dietary Management Product (VASCULERA) TABS Take 1 capsule by mouth daily. 30  tablet 11  . fluocinonide cream (LIDEX) 0.05 % Apply 1 application topically 2 (two) times daily. 60 g 2  . SUMAtriptan (IMITREX) 100 MG tablet Take 1 tablet (100 mg total) by mouth every 2 (two) hours as needed for migraine. May repeat in 2 hours if headache persists or recurs. 10 tablet 5  . Vitamin D, Ergocalciferol, (DRISDOL) 1.25 MG (50000 UT) CAPS capsule Take 1 capsule (50,000 Units total) by mouth every 7 (seven) days. 4 capsule 0   No current facility-administered medications on file prior to visit.     PAST MEDICAL HISTORY: Past Medical History:  Diagnosis Date  . Back pain   . Eczema   . Major depressive disorder   . Migraine   . Normal pregnancy 03/11/2012  . Ovarian cyst   . Postpartum hemorrhage   . SVD (spontaneous vaginal delivery) 03/11/2012    PAST SURGICAL HISTORY: History reviewed. No pertinent surgical history.  SOCIAL HISTORY: Social History   Tobacco Use  . Smoking status: Current Every Day Smoker    Packs/day: 0.50    Years: 20.00    Pack years: 10.00    Types: Cigarettes  . Smokeless tobacco: Never Used  Substance Use Topics  . Alcohol use: Yes    Alcohol/week: 0.0 standard drinks    Comment: occassionally for social functions  . Drug use: No    FAMILY HISTORY: Family History  Problem Relation Age of Onset  . Arthritis Father   . Sudden death Father 60       Sinus Cancer  . Cancer Father        nasal cancer  . Bipolar disorder Mother   . Depression Mother   . Anxiety disorder Mother   . Alcohol abuse Mother   . Drug abuse Mother   . Obesity Mother   . Sudden death Brother        GSW  . Arthritis Maternal Grandfather   . Hyperlipidemia Maternal Grandfather   . Heart disease Maternal Grandfather   . Breast cancer Maternal Grandmother   . Arthritis Maternal Grandmother   . Lung cancer Maternal Grandmother   . Heart disease Maternal Grandmother   . Hyperlipidemia Paternal Grandmother   . Stroke Paternal Grandfather      ROS: Review of Systems  Constitutional: Positive for malaise/fatigue and weight loss.  Cardiovascular: Negative for chest pain and claudication.  Gastrointestinal: Negative for nausea and vomiting.  Musculoskeletal: Negative for myalgias.       Negative muscle weakness    PHYSICAL EXAM: Blood pressure 102/68, pulse 74, temperature 98.2 F (36.8 C), temperature source Oral, height  (1.676 m), weight 191 lb (86.6 kg), last menstrual period 04/23/2018, SpO2 98 %. Body mass index is 30.83 kg/m. Physical Exam Vitals signs reviewed.  Constitutional:      Appearance: Normal appearance. She is obese.  Cardiovascular:     Rate and Rhythm: Normal rate.     Pulses: Normal pulses.  Pulmonary:     Effort: Pulmonary effort is normal.     Breath sounds: Normal breath sounds.  Musculoskeletal: Normal range of motion.  Skin:    General: Skin is warm and dry.  Neurological:     Mental Status: She is alert and oriented to person, place, and time.  Psychiatric:        Mood and Affect: Mood normal.        Behavior: Behavior normal.     RECENT LABS AND TESTS: BMET    Component Value Date/Time   NA 139 04/24/2018 1413   K 4.4 04/24/2018 1413   CL 102 04/24/2018 1413   CO2 25 04/24/2018 1413   GLUCOSE 92 04/24/2018 1413   GLUCOSE 76 10/31/2017 1659   BUN 11 04/24/2018 1413   CREATININE 0.73 04/24/2018 1413   CALCIUM 9.1 04/24/2018 1413   GFRNONAA 106 04/24/2018 1413   GFRAA 122 04/24/2018 1413   Lab Results  Component Value Date   HGBA1C 5.5 04/24/2018   HGBA1C 5.5 08/29/2014   Lab Results  Component Value Date   INSULIN 6.7 04/24/2018   CBC    Component Value Date/Time   WBC 9.2 04/24/2018 1413   WBC 8.7 10/31/2017 1659   RBC 4.63 04/24/2018 1413   RBC 4.55 10/31/2017 1659   HGB 14.0 04/24/2018 1413   HCT 42.0 04/24/2018 1413   PLT 244.0 10/31/2017 1659   MCV 91 04/24/2018 1413   MCH 30.2 04/24/2018 1413   MCH 30.0 01/03/2014 1558   MCHC 33.3 04/24/2018  1413   MCHC 34.1 10/31/2017 1659   RDW 12.9 04/24/2018 1413   LYMPHSABS 2.0 04/24/2018 1413   MONOABS 0.6 10/31/2017 1659   EOSABS 0.2 04/24/2018 1413   BASOSABS 0.0 04/24/2018 1413   Iron/TIBC/Ferritin/ %Sat No results found for: IRON, TIBC, FERRITIN, IRONPCTSAT Lipid Panel     Component Value Date/Time   CHOL 168 04/24/2018 1413   TRIG 54 04/24/2018 1413   HDL 54 04/24/2018 1413   CHOLHDL 3 10/31/2017 1418   VLDL 15.0 10/31/2017 1418   LDLCALC 103 (H) 04/24/2018 1413   Hepatic Function Panel     Component Value Date/Time   PROT 6.8 04/24/2018 1413   ALBUMIN 4.5 04/24/2018 1413   AST 14 04/24/2018 1413   ALT 12 04/24/2018 1413   ALKPHOS 55 04/24/2018 1413   BILITOT 0.4 04/24/2018 1413      Component Value Date/Time   TSH 1.030 04/24/2018 1413   TSH 1.32 10/31/2017 1659   TSH 1.27 10/12/2016 0830      OBESITY BEHAVIORAL INTERVENTION VISIT  Today's visit was # 3   Starting weight: 191 lbs Starting date: 04/24/2018 Today's weight : 191 lbs Today's date: 05/23/2018 Total lbs lost to date: 0    05/23/2018  Height  (1.676 m)  Weight 191 lb (86.6 kg)  BMI (Calculated) 30.84  BLOOD PRESSURE - SYSTOLIC 102  BLOOD PRESSURE - DIASTOLIC 68   Body Fat % 34.6 %  Total Body Water (lbs) 82.8 lbs     ASK: We discussed the diagnosis of obesity with Christina [name] today and Christina [name] agreed to give [name] permission to discuss obesity behavioral modification therapy today.  ASSESS: Christina [name] has the diagnosis of obesity and her BMI today is 30.84 Christina [name] is in the action stage of change   ADVISE: Christina [name] was educated on the multiple health risks of obesity as well as the benefit of weight loss to improve her health. She was advised of the need for long term treatment and the importance of lifestyle modifications to improve her current health and to decrease her risk of future health problems.  AGREE: Multiple dietary modification options and  treatment options were discussed and  Christina [name] agreed to follow the recommendations documented in the above note.  ARRANGE: Christina [name] was educated on the importance of frequent visits to treat obesity as outlined per CMS and USPSTF guidelines and agreed to schedule her next follow up appointment today.  I, Burt Knack, am acting as transcriptionist for Debbra Riding, MD  I have reviewed the above documentation for accuracy and completeness, and I agree with the above. - Debbra Riding, MD

## 2018-06-09 ENCOUNTER — Other Ambulatory Visit (INDEPENDENT_AMBULATORY_CARE_PROVIDER_SITE_OTHER): Payer: Self-pay | Admitting: Family Medicine

## 2018-06-09 DIAGNOSIS — E559 Vitamin D deficiency, unspecified: Secondary | ICD-10-CM

## 2018-06-12 ENCOUNTER — Encounter (INDEPENDENT_AMBULATORY_CARE_PROVIDER_SITE_OTHER): Payer: Self-pay | Admitting: Family Medicine

## 2018-06-12 ENCOUNTER — Ambulatory Visit (INDEPENDENT_AMBULATORY_CARE_PROVIDER_SITE_OTHER): Payer: 59 | Admitting: Family Medicine

## 2018-06-12 ENCOUNTER — Other Ambulatory Visit: Payer: Self-pay

## 2018-06-12 VITALS — BP 104/67 | HR 83 | Temp 98.8°F | Ht 66.0 in | Wt 191.0 lb

## 2018-06-12 DIAGNOSIS — E7849 Other hyperlipidemia: Secondary | ICD-10-CM

## 2018-06-12 DIAGNOSIS — E669 Obesity, unspecified: Secondary | ICD-10-CM | POA: Diagnosis not present

## 2018-06-12 DIAGNOSIS — Z9189 Other specified personal risk factors, not elsewhere classified: Secondary | ICD-10-CM

## 2018-06-12 DIAGNOSIS — Z6831 Body mass index (BMI) 31.0-31.9, adult: Secondary | ICD-10-CM | POA: Diagnosis not present

## 2018-06-12 DIAGNOSIS — E559 Vitamin D deficiency, unspecified: Secondary | ICD-10-CM

## 2018-06-12 MED ORDER — VITAMIN D (ERGOCALCIFEROL) 1.25 MG (50000 UNIT) PO CAPS: 50000.0000 [IU] | ORAL_CAPSULE | ORAL | 0 refills | Status: DC

## 2018-06-13 ENCOUNTER — Encounter (INDEPENDENT_AMBULATORY_CARE_PROVIDER_SITE_OTHER): Payer: Self-pay

## 2018-06-13 MED FILL — VIT D2 1.25 MG (50,000 UNIT: 1.25 MG | 28 days supply | Qty: 4 | Fill #0

## 2018-06-13 NOTE — Progress Notes (Signed)
Office: [phone]  /  Fax: [phone]   HPI:   Chief Complaint: OBESITY Christina [name] is here to discuss her progress with her obesity treatment plan. She is on the Category 2 plan + 100 calories and is following her eating plan approximately 80 % of the time. She states she is exercising 0 minutes 0 times per week. Christina [name] states that things were relatively ok until this week, but she has run out of food. She is getting all food in. She finds herself hungry if she doesn't eat.  Her weight is 191 lb (86.6 kg) today and has not lost weight since her last visit. She has lost 0 lbs since starting treatment with [name].  Vitamin D Deficiency Christina [name] has a diagnosis of vitamin D deficiency. She is currently taking prescription Vit D. She notes fatigue and denies nausea, vomiting or muscle weakness.  At risk for osteopenia and osteoporosis Christina [name] is at higher risk of osteopenia and osteoporosis due to vitamin D deficiency.   Hyperlipidemia Christina [name] has hyperlipidemia and has been trying to improve her cholesterol levels with intensive lifestyle modification including a low saturated fat diet, exercise and weight loss. She has been consistently following the Category 2 plan and she is not on medications. She denies any chest pain, claudication or myalgias.  ASSESSMENT AND PLAN:  Vitamin D deficiency - Plan: Vitamin D, Ergocalciferol, (DRISDOL) 1.25 MG (50000 UT) CAPS capsule  Other hyperlipidemia  At risk for osteoporosis  Class 1 obesity with serious comorbidity and body mass index (BMI) of 31.0 to 31.9 in adult, unspecified obesity type  PLAN:  Vitamin D Deficiency Christina [name] was informed that low vitamin D levels contributes to fatigue and are associated with obesity, breast, and colon cancer. Christina [name] agrees to continue taking prescription Vit D @50 ,000 IU every week #4 and we will refill for 1 month. She will follow up for routine testing of vitamin D, at least 2-3 times per  year. She was informed of the risk of over-replacement of vitamin D and agrees to not increase her dose unless she discusses this with [name] first. Christina [name] agrees to follow up with our clinic in 2 weeks.  At risk for osteopenia and osteoporosis Christina [name] was given extended (15 minutes) osteoporosis prevention counseling today. Christina [name] is at risk for osteopenia and osteoporsis due to her vitamin D deficiency. She was encouraged to take her vitamin D and follow her higher calcium diet and increase strengthening exercise to help strengthen her bones and decrease her risk of osteopenia and osteoporosis.  Hyperlipidemia Christina [name] was informed of the American Heart Association Guidelines emphasizing intensive lifestyle modifications as the first line treatment for hyperlipidemia. We discussed many lifestyle modifications today in depth, and Christina [name] will continue to work on decreasing saturated fats such as fatty red meat, butter and many fried foods. She will also increase vegetables and lean protein in her diet and continue to work on exercise and weight loss efforts. We will repeat labs in mid May. Christina [name] agrees to follow up with our clinic in 2 weeks.  Obesity Christina [name] is currently in the action stage of change. As such, her goal is to continue with weight loss efforts She has agreed to keep a food journal with 1400-1500 calories and 90+ grams of protein daily or follow the Category 2 plan + 100 calories Christina [name] has been instructed to work up to a goal of 150 minutes of combined cardio and strengthening exercise per week for weight loss and overall health benefits. We discussed the  following Behavioral Modification Strategies today: increasing lean protein intake, increasing vegetables and work on meal planning and easy cooking plans, and planning for success   Christina [name] has agreed to follow up with our clinic in 2 weeks. She was informed of the importance of frequent follow up visits to  maximize her success with intensive lifestyle modifications for her multiple health conditions.  ALLERGIES: Allergies  Allergen Reactions  . Hydrocodone Itching  . Prednisone Other (See Comments)    Hyper, agitated, aggressive    MEDICATIONS: Current Outpatient Medications on File Prior to Visit  Medication Sig Dispense Refill  . buPROPion (WELLBUTRIN XL) 150 MG 24 hr tablet Take 1 tablet (150 mg total) by mouth daily. 90 tablet 1  . diclofenac (VOLTAREN) 50 MG EC tablet Take 1 tablet (50 mg total) by mouth 2 (two) times daily. 180 tablet 1  . Dietary Management Product (VASCULERA) TABS Take 1 capsule by mouth daily. 30 tablet 11  . fluocinonide cream (LIDEX) 0.05 % Apply 1 application topically 2 (two) times daily. 60 g 2  . SUMAtriptan (IMITREX) 100 MG tablet Take 1 tablet (100 mg total) by mouth every 2 (two) hours as needed for migraine. May repeat in 2 hours if headache persists or recurs. 10 tablet 5   No current facility-administered medications on file prior to visit.     PAST MEDICAL HISTORY: Past Medical History:  Diagnosis Date  . Back pain   . Eczema   . Major depressive disorder   . Migraine   . Normal pregnancy 03/11/2012  . Ovarian cyst   . Postpartum hemorrhage   . SVD (spontaneous vaginal delivery) 03/11/2012    PAST SURGICAL HISTORY: History reviewed. No pertinent surgical history.  SOCIAL HISTORY: Social History   Tobacco Use  . Smoking status: Current Every Day Smoker    Packs/day: 0.50    Years: 20.00    Pack years: 10.00    Types: Cigarettes  . Smokeless tobacco: Never Used  Substance Use Topics  . Alcohol use: Yes    Alcohol/week: 0.0 standard drinks    Comment: occassionally for social functions  . Drug use: No    FAMILY HISTORY: Family History  Problem Relation Age of Onset  . Arthritis Father   . Sudden death Father 52       Sinus Cancer  . Cancer Father        nasal cancer  . Bipolar disorder Mother   . Depression Mother   .  Anxiety disorder Mother   . Alcohol abuse Mother   . Drug abuse Mother   . Obesity Mother   . Sudden death Brother        GSW  . Arthritis Maternal Grandfather   . Hyperlipidemia Maternal Grandfather   . Heart disease Maternal Grandfather   . Breast cancer Maternal Grandmother   . Arthritis Maternal Grandmother   . Lung cancer Maternal Grandmother   . Heart disease Maternal Grandmother   . Hyperlipidemia Paternal Grandmother   . Stroke Paternal Grandfather     ROS: Review of Systems  Constitutional: Positive for malaise/fatigue. Negative for weight loss.  Cardiovascular: Negative for chest pain and claudication.  Gastrointestinal: Negative for nausea and vomiting.  Musculoskeletal: Negative for myalgias.       Negative muscle weakness    PHYSICAL EXAM: Blood pressure 104/67, pulse 83, temperature 98.8 F (37.1 C), temperature source Oral, height 5\' 6"  (1.676 m), weight 191 lb (86.6 kg), SpO2 97 %. Body mass index is 30.83 kg/m.  Physical Exam Vitals signs reviewed.  Constitutional:      Appearance: Normal appearance. She is obese.  Cardiovascular:     Rate and Rhythm: Normal rate.     Pulses: Normal pulses.  Pulmonary:     Effort: Pulmonary effort is normal.     Breath sounds: Normal breath sounds.  Musculoskeletal: Normal range of motion.  Skin:    General: Skin is warm and dry.  Neurological:     Mental Status: She is alert and oriented to person, place, and time.  Psychiatric:        Mood and Affect: Mood normal.        Behavior: Behavior normal.     RECENT LABS AND TESTS: BMET    Component Value Date/Time   NA 139 04/24/2018 1413   K 4.4 04/24/2018 1413   CL 102 04/24/2018 1413   CO2 25 04/24/2018 1413   GLUCOSE 92 04/24/2018 1413   GLUCOSE 76 10/31/2017 1659   BUN 11 04/24/2018 1413   CREATININE 0.73 04/24/2018 1413   CALCIUM 9.1 04/24/2018 1413   GFRNONAA 106 04/24/2018 1413   GFRAA 122 04/24/2018 1413   Lab Results  Component Value Date    HGBA1C 5.5 04/24/2018   HGBA1C 5.5 08/29/2014   Lab Results  Component Value Date   INSULIN 6.7 04/24/2018   CBC    Component Value Date/Time   WBC 9.2 04/24/2018 1413   WBC 8.7 10/31/2017 1659   RBC 4.63 04/24/2018 1413   RBC 4.55 10/31/2017 1659   HGB 14.0 04/24/2018 1413   HCT 42.0 04/24/2018 1413   PLT 244.0 10/31/2017 1659   MCV 91 04/24/2018 1413   MCH 30.2 04/24/2018 1413   MCH 30.0 01/03/2014 1558   MCHC 33.3 04/24/2018 1413   MCHC 34.1 10/31/2017 1659   RDW 12.9 04/24/2018 1413   LYMPHSABS 2.0 04/24/2018 1413   MONOABS 0.6 10/31/2017 1659   EOSABS 0.2 04/24/2018 1413   BASOSABS 0.0 04/24/2018 1413   Iron/TIBC/Ferritin/ %Sat No results found for: IRON, TIBC, FERRITIN, IRONPCTSAT Lipid Panel     Component Value Date/Time   CHOL 168 04/24/2018 1413   TRIG 54 04/24/2018 1413   HDL 54 04/24/2018 1413   CHOLHDL 3 10/31/2017 1418   VLDL 15.0 10/31/2017 1418   LDLCALC 103 (H) 04/24/2018 1413   Hepatic Function Panel     Component Value Date/Time   PROT 6.8 04/24/2018 1413   ALBUMIN 4.5 04/24/2018 1413   AST 14 04/24/2018 1413   ALT 12 04/24/2018 1413   ALKPHOS 55 04/24/2018 1413   BILITOT 0.4 04/24/2018 1413      Component Value Date/Time   TSH 1.030 04/24/2018 1413   TSH 1.32 10/31/2017 1659   TSH 1.27 10/12/2016 0830      OBESITY BEHAVIORAL INTERVENTION VISIT  Today's visit was # 4   Starting weight: 191 lbs Starting date: 04/24/2018 Today's weight : 191 lbs  Today's date: 06/12/2018 Total lbs lost to date: 0    06/12/2018  Height  (1.676 m)  Weight 191 lb (86.6 kg)  BMI (Calculated) 30.84  BLOOD PRESSURE - SYSTOLIC 104  BLOOD PRESSURE - DIASTOLIC 67   Body Fat % 35.7 %  Total Body Water (lbs) 82.2 lbs     ASK: We discussed the diagnosis of obesity with Thurnell Garbe M [name] today and [place] agreed to give [name] permission to discuss obesity behavioral modification therapy today.  ASSESS: Haelee has the diagnosis of  obesity and her BMI today  is 30.84 Kani is in the action stage of change   ADVISE: Cyara was educated on the multiple health risks of obesity as well as the benefit of weight loss to improve her health. She was advised of the need for long term treatment and the importance of lifestyle modifications to improve her current health and to decrease her risk of future health problems.  AGREE: Multiple dietary modification options and treatment options were discussed and  Chava agreed to follow the recommendations documented in the above note.  ARRANGE: Johanne was educated on the importance of frequent visits to treat obesity as outlined per CMS and USPSTF guidelines and agreed to schedule her next follow up appointment today.  I, Burt Knack, am acting as transcriptionist for Debbra Riding, MD  I have reviewed the above documentation for accuracy and completeness, and I agree with the above. - Debbra Riding, MD

## 2018-06-22 ENCOUNTER — Encounter (INDEPENDENT_AMBULATORY_CARE_PROVIDER_SITE_OTHER): Payer: Self-pay

## 2018-06-26 ENCOUNTER — Ambulatory Visit (INDEPENDENT_AMBULATORY_CARE_PROVIDER_SITE_OTHER): Payer: 59 | Admitting: Family Medicine

## 2018-06-26 ENCOUNTER — Other Ambulatory Visit: Payer: Self-pay

## 2018-06-26 ENCOUNTER — Encounter (INDEPENDENT_AMBULATORY_CARE_PROVIDER_SITE_OTHER): Payer: Self-pay | Admitting: Family Medicine

## 2018-06-26 DIAGNOSIS — Z6830 Body mass index (BMI) 30.0-30.9, adult: Secondary | ICD-10-CM | POA: Diagnosis not present

## 2018-06-26 DIAGNOSIS — E669 Obesity, unspecified: Secondary | ICD-10-CM | POA: Diagnosis not present

## 2018-06-26 DIAGNOSIS — E559 Vitamin D deficiency, unspecified: Secondary | ICD-10-CM | POA: Diagnosis not present

## 2018-06-26 DIAGNOSIS — E7849 Other hyperlipidemia: Secondary | ICD-10-CM

## 2018-06-26 NOTE — Progress Notes (Signed)
Office: [phone]  /  Fax: [phone] TeleHealth Visit:  Christina [name] has verbally consented to this TeleHealth visit today. The patient is located at home, the provider is located at the [facility] office. The participants in this visit include the listed provider and patient and provider's assistant. The visit was conducted today via webex.  HPI:   Chief Complaint: OBESITY Christina [name] is here to discuss her progress with her obesity treatment plan. She is on the keep a food journal with 1400-1500 calories and 90+ grams of protein daily or follow the Category 2 plan + 100 calories and is following her eating plan approximately 90 % of the time. She states she is exercising 0 minutes 0 times per week. Christina [name] has been mostly following Category 2 plan. Her family planted a vegetable garden this past weekend. She is doing dinnerly and getting 4 dinner a week. She finds Category 2 to be easiest to follow.  We were unable to weigh the patient today for this TeleHealth visit. She feels as if she has lost 1 lb since her last visit. She has lost 0 lbs since starting treatment with [name].  Vitamin D Deficiency Christina [name] has a diagnosis of vitamin D deficiency. She is currently taking prescription Vit D. She notes fatigue and denies nausea, vomiting or muscle weakness.  Hyperlipidemia Christina [name] has hyperlipidemia and has been trying to improve her cholesterol levels with intensive lifestyle modification including a low saturated fat diet, exercise and weight loss. Last LDL was minimally elevated and HDL within normal limits. She denies any chest pain, claudication or myalgias.  ASSESSMENT AND PLAN:  Vitamin D deficiency  Other hyperlipidemia  Class 1 obesity with serious comorbidity and body mass index (BMI) of 30.0 to 30.9 in adult, unspecified obesity type  PLAN:  Vitamin D Deficiency Christina [name] was informed that low vitamin D levels contributes to fatigue  and are associated with obesity, breast, and colon cancer. Christina [name] agrees to continue taking prescription Vit D @50 ,000 IU every week, no refill needed. She will follow up for routine testing of vitamin D, at least 2-3 times per year. She was informed of the risk of over-replacement of vitamin D and agrees to not increase her dose unless she discusses this with [name] first. Christina [name] agrees to follow up with our clinic in 2 weeks.  Hyperlipidemia Christina [name] was informed of the American Heart Association Guidelines emphasizing intensive lifestyle modifications as the first line treatment for hyperlipidemia. We discussed many lifestyle modifications today in depth, and Christina [name] will continue to work on decreasing saturated fats such as fatty red meat, butter and many fried foods. She will also increase vegetables and lean protein in her diet and continue to work on exercise and weight loss efforts. We will repeat FLP in mid May. Christina [name] agrees to follow up with our clinic in 2 weeks.  Obesity Christina [name] is currently in the action stage of change. As such, her goal is to continue with weight loss efforts She has agreed to follow the Category 2 plan Christina [name] has been instructed to work up to a goal of 150 minutes of combined cardio and strengthening exercise per week for weight loss and overall health benefits. We discussed the following Behavioral Modification Strategies today: increasing lean protein intake, increasing vegetables, work on meal planning and easy cooking plans, and planning for success   Christina [name] has agreed to follow up with our clinic in 2 weeks. She was informed of the importance of frequent follow up  visits to maximize her success with intensive lifestyle modifications for her multiple health conditions.  ALLERGIES: Allergies  Allergen Reactions  . Hydrocodone Itching  . Prednisone Other (See Comments)    Hyper, agitated, aggressive    MEDICATIONS: Current Outpatient  Medications on File Prior to Visit  Medication Sig Dispense Refill  . buPROPion (WELLBUTRIN XL) 150 MG 24 hr tablet Take 1 tablet (150 mg total) by mouth daily. 90 tablet 1  . diclofenac (VOLTAREN) 50 MG EC tablet Take 1 tablet (50 mg total) by mouth 2 (two) times daily. 180 tablet 1  . Dietary Management Product (VASCULERA) TABS Take 1 capsule by mouth daily. 30 tablet 11  . fluocinonide cream (LIDEX) 0.05 % Apply 1 application topically 2 (two) times daily. 60 g 2  . SUMAtriptan (IMITREX) 100 MG tablet Take 1 tablet (100 mg total) by mouth every 2 (two) hours as needed for migraine. May repeat in 2 hours if headache persists or recurs. 10 tablet 5  . Vitamin D, Ergocalciferol, (DRISDOL) 1.25 MG (50000 UT) CAPS capsule Take 1 capsule (50,000 Units total) by mouth every 7 (seven) days. 4 capsule 0   No current facility-administered medications on file prior to visit.     PAST MEDICAL HISTORY: Past Medical History:  Diagnosis Date  . Back pain   . Eczema   . Major depressive disorder   . Migraine   . Normal pregnancy 03/11/2012  . Ovarian cyst   . Postpartum hemorrhage   . SVD (spontaneous vaginal delivery) 03/11/2012    PAST SURGICAL HISTORY: History reviewed. No pertinent surgical history.  SOCIAL HISTORY: Social History   Tobacco Use  . Smoking status: Current Every Day Smoker    Packs/day: 0.50    Years: 20.00    Pack years: 10.00    Types: Cigarettes  . Smokeless tobacco: Never Used  Substance Use Topics  . Alcohol use: Yes    Alcohol/week: 0.0 standard drinks    Comment: occassionally for social functions  . Drug use: No    FAMILY HISTORY: Family History  Problem Relation Age of Onset  . Arthritis Father   . Sudden death Father 60       Sinus Cancer  . Cancer Father        nasal cancer  . Bipolar disorder Mother   . Depression Mother   . Anxiety disorder Mother   . Alcohol abuse Mother   . Drug abuse Mother   . Obesity Mother   . Sudden death Brother         GSW  . Arthritis Maternal Grandfather   . Hyperlipidemia Maternal Grandfather   . Heart disease Maternal Grandfather   . Breast cancer Maternal Grandmother   . Arthritis Maternal Grandmother   . Lung cancer Maternal Grandmother   . Heart disease Maternal Grandmother   . Hyperlipidemia Paternal Grandmother   . Stroke Paternal Grandfather     ROS: Review of Systems  Constitutional: Positive for malaise/fatigue and weight loss.  Cardiovascular: Negative for chest pain and claudication.  Gastrointestinal: Negative for nausea and vomiting.  Musculoskeletal: Negative for myalgias.       Negative muscle weakness    PHYSICAL EXAM: Pt in no acute distress  RECENT LABS AND TESTS: BMET    Component Value Date/Time   NA 139 04/24/2018 1413   K 4.4 04/24/2018 1413   CL 102 04/24/2018 1413   CO2 25 04/24/2018 1413   GLUCOSE 92 04/24/2018 1413   GLUCOSE 76 10/31/2017 1659  BUN 11 04/24/2018 1413   CREATININE 0.73 04/24/2018 1413   CALCIUM 9.1 04/24/2018 1413   GFRNONAA 106 04/24/2018 1413   GFRAA 122 04/24/2018 1413   Lab Results  Component Value Date   HGBA1C 5.5 04/24/2018   HGBA1C 5.5 08/29/2014   Lab Results  Component Value Date   INSULIN 6.7 04/24/2018   CBC    Component Value Date/Time   WBC 9.2 04/24/2018 1413   WBC 8.7 10/31/2017 1659   RBC 4.63 04/24/2018 1413   RBC 4.55 10/31/2017 1659   HGB 14.0 04/24/2018 1413   HCT 42.0 04/24/2018 1413   PLT 244.0 10/31/2017 1659   MCV 91 04/24/2018 1413   MCH 30.2 04/24/2018 1413   MCH 30.0 01/03/2014 1558   MCHC 33.3 04/24/2018 1413   MCHC 34.1 10/31/2017 1659   RDW 12.9 04/24/2018 1413   LYMPHSABS 2.0 04/24/2018 1413   MONOABS 0.6 10/31/2017 1659   EOSABS 0.2 04/24/2018 1413   BASOSABS 0.0 04/24/2018 1413   Iron/TIBC/Ferritin/ %Sat No results found for: IRON, TIBC, FERRITIN, IRONPCTSAT Lipid Panel     Component Value Date/Time   CHOL 168 04/24/2018 1413   TRIG 54 04/24/2018 1413   HDL 54 04/24/2018  1413   CHOLHDL 3 10/31/2017 1418   VLDL 15.0 10/31/2017 1418   LDLCALC 103 (H) 04/24/2018 1413   Hepatic Function Panel     Component Value Date/Time   PROT 6.8 04/24/2018 1413   ALBUMIN 4.5 04/24/2018 1413   AST 14 04/24/2018 1413   ALT 12 04/24/2018 1413   ALKPHOS 55 04/24/2018 1413   BILITOT 0.4 04/24/2018 1413      Component Value Date/Time   TSH 1.030 04/24/2018 1413   TSH 1.32 10/31/2017 1659   TSH 1.27 10/12/2016 0830      I, Burt Knack, am acting as transcriptionist for Debbra Riding, MD  I have reviewed the above documentation for accuracy and completeness, and I agree with the above. - Debbra Riding, MD

## 2018-06-27 ENCOUNTER — Ambulatory Visit (INDEPENDENT_AMBULATORY_CARE_PROVIDER_SITE_OTHER): Payer: 59 | Admitting: Family Medicine

## 2018-07-10 ENCOUNTER — Encounter (INDEPENDENT_AMBULATORY_CARE_PROVIDER_SITE_OTHER): Payer: Self-pay | Admitting: Family Medicine

## 2018-07-10 ENCOUNTER — Ambulatory Visit (INDEPENDENT_AMBULATORY_CARE_PROVIDER_SITE_OTHER): Payer: 59 | Admitting: Family Medicine

## 2018-07-10 ENCOUNTER — Other Ambulatory Visit: Payer: Self-pay

## 2018-07-10 DIAGNOSIS — E8881 Metabolic syndrome: Secondary | ICD-10-CM

## 2018-07-10 DIAGNOSIS — E669 Obesity, unspecified: Secondary | ICD-10-CM | POA: Diagnosis not present

## 2018-07-10 DIAGNOSIS — E7849 Other hyperlipidemia: Secondary | ICD-10-CM

## 2018-07-10 DIAGNOSIS — Z6830 Body mass index (BMI) 30.0-30.9, adult: Secondary | ICD-10-CM | POA: Diagnosis not present

## 2018-07-12 NOTE — Progress Notes (Signed)
Office: [phone]  /  Fax: [phone] TeleHealth Visit:  Christina McmurrayStefannie [name] Christina PalmaDavidson has verbally consented to this TeleHealth visit today. The patient is located at home, the provider is located at the [facility] office. The participants in this visit include the listed provider and patient. The visit was conducted today via webex.  HPI:   Chief Complaint: OBESITY Christina GarbeStefannie is here to discuss her progress with her obesity treatment plan. She is on the Category 2 plan and is following her eating plan approximately 95 % of the time. She states she is gardening. Pearly voices the last few weeks has been relatively good. When she is working her second job, she can't get all the food in. She has fairly increased stress at home. She is walking at work at lunch to help with stress relief. She denies hunger. She states she weighed at 193.2 lbs this morning.  We were unable to weigh the patient today for this TeleHealth visit. She feels as if she has lost 2 lbs since her last visit. She has lost 0 lbs since starting treatment with us.  Hyperlipidemia Christina [name] has hyperlipidemia and has been trying to improve her cholesterol levels with intensive lifestyle modification including a low saturated fat diet, exercise and weight loss. Last LDL was slightly elevated and HDL was within normal limits. She denies any chest pain, claudication or myalgias.  Insulin Resistance Christina [name] has a diagnosis of insulin resistance based on her elevated fasting insulin level >5. Last Hgb A1c was 5.5 and insulin was 6.7. Although Christina [name]'s blood glucose readings are still under good control, insulin resistance puts her at greater risk of metabolic syndrome and diabetes. She is not on medications and notes minimal carbohydrate cravings. She continues to work on diet and exercise to decrease risk of diabetes.  ASSESSMENT AND PLAN:  Other hyperlipidemia  Insulin resistance  Class 1 obesity  with serious comorbidity and body mass index (BMI) of 30.0 to 30.9 in adult, unspecified obesity type  PLAN:  Hyperlipidemia Christina [name] was informed of the American Heart Association Guidelines emphasizing intensive lifestyle modifications as the first line treatment for hyperlipidemia. We discussed many lifestyle modifications today in depth, and Christina [name] will continue to work on decreasing saturated fats such as fatty red meat, butter and many fried foods. She will also increase vegetables and lean protein in her diet and continue to work on exercise and weight loss efforts. We will repeat FLP in mid May. [place]ada agrees to follow up with our clinic in 2 weeks.  Insulin Resistance Christina [name] will continue to work on weight loss, exercise, and decreasing simple carbohydrates in her diet to help decrease the risk of diabetes. We dicussed metformin including benefits and risks. She was informed that eating too many simple carbohydrates or too many calories at one sitting increases the likelihood of GI side effects. Christina [name] declined metformin for now and prescription was not written today. We will repeat labs in mid May. Christina [name] agrees to follow up with our clinic in 2 weeks as directed to monitor her progress.  Obesity Christina [name] is currently in the action stage of change. As such, her goal is to continue with weight loss efforts She has agreed to follow the Category 2 plan Christina GarbeStefannie has been instructed to work up to a goal of 150 minutes of combined cardio and strengthening exercise per week for weight loss and overall health benefits. We discussed the following Behavioral Modification Strategies today: increasing lean protein intake, work on meal planning  and easy cooking plans, emotional eating strategies, ways to avoid boredom eating and avoiding temptations, better snacking choices, and planning for success   Christina [name] has agreed to follow up with our clinic in 2 weeks. She was informed of  the importance of frequent follow up visits to maximize her success with intensive lifestyle modifications for her multiple health conditions.  ALLERGIES: Allergies  Allergen Reactions  . Hydrocodone Itching  . Prednisone Other (See Comments)    Hyper, agitated, aggressive    MEDICATIONS: Current Outpatient Medications on File Prior to Visit  Medication Sig Dispense Refill  . buPROPion (WELLBUTRIN XL) 150 MG 24 hr tablet Take 1 tablet (150 mg total) by mouth daily. 90 tablet 1  . diclofenac (VOLTAREN) 50 MG EC tablet Take 1 tablet (50 mg total) by mouth 2 (two) times daily. 180 tablet 1  . Dietary Management Product (VASCULERA) TABS Take 1 capsule by mouth daily. 30 tablet 11  . fluocinonide cream (LIDEX) 0.05 % Apply 1 application topically 2 (two) times daily. 60 g 2  . SUMAtriptan (IMITREX) 100 MG tablet Take 1 tablet (100 mg total) by mouth every 2 (two) hours as needed for migraine. May repeat in 2 hours if headache persists or recurs. 10 tablet 5  . Vitamin D, Ergocalciferol, (DRISDOL) 1.25 MG (50000 UT) CAPS capsule Take 1 capsule (50,000 Units total) by mouth every 7 (seven) days. 4 capsule 0   No current facility-administered medications on file prior to visit.     PAST MEDICAL HISTORY: Past Medical History:  Diagnosis Date  . Back pain   . Eczema   . Major depressive disorder   . Migraine   . Normal pregnancy 03/11/2012  . Ovarian cyst   . Postpartum hemorrhage   . SVD (spontaneous vaginal delivery) 03/11/2012    PAST SURGICAL HISTORY: History reviewed. No pertinent surgical history.  SOCIAL HISTORY: Social History   Tobacco Use  . Smoking status: Current Every Day Smoker    Packs/day: 0.50    Years: 20.00    Pack years: 10.00    Types: Cigarettes  . Smokeless tobacco: Never Used  Substance Use Topics  . Alcohol use: Yes    Alcohol/week: 0.0 standard drinks    Comment: occassionally for social functions  . Drug use: No    FAMILY HISTORY: Family  History  Problem Relation Age of Onset  . Arthritis Father   . Sudden death Father 17       Sinus Cancer  . Cancer Father        nasal cancer  . Bipolar disorder Mother   . Depression Mother   . Anxiety disorder Mother   . Alcohol abuse Mother   . Drug abuse Mother   . Obesity Mother   . Sudden death Brother        GSW  . Arthritis Maternal Grandfather   . Hyperlipidemia Maternal Grandfather   . Heart disease Maternal Grandfather   . Breast cancer Maternal Grandmother   . Arthritis Maternal Grandmother   . Lung cancer Maternal Grandmother   . Heart disease Maternal Grandmother   . Hyperlipidemia Paternal Grandmother   . Stroke Paternal Grandfather     ROS: Review of Systems  Constitutional: Positive for weight loss.  Cardiovascular: Negative for chest pain and claudication.  Musculoskeletal: Negative for myalgias.    PHYSICAL EXAM: Pt in no acute distress  RECENT LABS AND TESTS: BMET    Component Value Date/Time   NA 139 04/24/2018 1413   K  4.4 04/24/2018 1413   CL 102 04/24/2018 1413   CO2 25 04/24/2018 1413   GLUCOSE 92 04/24/2018 1413   GLUCOSE 76 10/31/2017 1659   BUN 11 04/24/2018 1413   CREATININE 0.73 04/24/2018 1413   CALCIUM 9.1 04/24/2018 1413   GFRNONAA 106 04/24/2018 1413   GFRAA 122 04/24/2018 1413   Lab Results  Component Value Date   HGBA1C 5.5 04/24/2018   HGBA1C 5.5 08/29/2014   Lab Results  Component Value Date   INSULIN 6.7 04/24/2018   CBC    Component Value Date/Time   WBC 9.2 04/24/2018 1413   WBC 8.7 10/31/2017 1659   RBC 4.63 04/24/2018 1413   RBC 4.55 10/31/2017 1659   HGB 14.0 04/24/2018 1413   HCT 42.0 04/24/2018 1413   PLT 244.0 10/31/2017 1659   MCV 91 04/24/2018 1413   MCH 30.2 04/24/2018 1413   MCH 30.0 01/03/2014 1558   MCHC 33.3 04/24/2018 1413   MCHC 34.1 10/31/2017 1659   RDW 12.9 04/24/2018 1413   LYMPHSABS 2.0 04/24/2018 1413   MONOABS 0.6 10/31/2017 1659   EOSABS 0.2 04/24/2018 1413   BASOSABS 0.0  04/24/2018 1413   Iron/TIBC/Ferritin/ %Sat No results found for: IRON, TIBC, FERRITIN, IRONPCTSAT Lipid Panel     Component Value Date/Time   CHOL 168 04/24/2018 1413   TRIG 54 04/24/2018 1413   HDL 54 04/24/2018 1413   CHOLHDL 3 10/31/2017 1418   VLDL 15.0 10/31/2017 1418   LDLCALC 103 (H) 04/24/2018 1413   Hepatic Function Panel     Component Value Date/Time   PROT 6.8 04/24/2018 1413   ALBUMIN 4.5 04/24/2018 1413   AST 14 04/24/2018 1413   ALT 12 04/24/2018 1413   ALKPHOS 55 04/24/2018 1413   BILITOT 0.4 04/24/2018 1413      Component Value Date/Time   TSH 1.030 04/24/2018 1413   TSH 1.32 10/31/2017 1659   TSH 1.27 10/12/2016 0830      I, Burt Knack, am acting as transcriptionist for Debbra Riding, MD  I have reviewed the above documentation for accuracy and completeness, and I agree with the above. - Debbra Riding, MD

## 2018-07-19 ENCOUNTER — Ambulatory Visit (INDEPENDENT_AMBULATORY_CARE_PROVIDER_SITE_OTHER)
Admission: RE | Admit: 2018-07-19 | Discharge: 2018-07-19 | Disposition: A | Discharge status: Home / Self Care | Payer: 59 | Source: Ambulatory Visit | Attending: Internal Medicine | Admitting: Internal Medicine

## 2018-07-19 ENCOUNTER — Encounter: Payer: Self-pay | Admitting: Internal Medicine

## 2018-07-19 ENCOUNTER — Other Ambulatory Visit: Payer: Self-pay

## 2018-07-19 ENCOUNTER — Ambulatory Visit: Payer: 59 | Admitting: Internal Medicine

## 2018-07-19 VITALS — BP 106/64 | HR 63 | Temp 98.2°F | Resp 16 | Ht 66.0 in | Wt 193.0 lb

## 2018-07-19 DIAGNOSIS — M25521 Pain in right elbow: Secondary | ICD-10-CM

## 2018-07-19 DIAGNOSIS — M7021 Olecranon bursitis, right elbow: Secondary | ICD-10-CM

## 2018-07-19 MED ORDER — METHYLPREDNISOLONE ACETATE 80 MG/ML IJ SUSP
120.0000 mg | Freq: Once | INTRAMUSCULAR | Status: AC
  Administered 2018-07-19: 12:00:00 120 mg via INTRAMUSCULAR

## 2018-07-19 NOTE — Progress Notes (Signed)
Subjective:  Patient ID: Christina [name], female    DOB: [birth year]  Age: 38 y.o. MRN: [id number]  CC: Elbow Pain   HPI Christina [name] presents for concerns about her right elbow.  She did repetitive activity about 4 days ago, shoveling items from her yard into a trailer.  She gradually developed pain over the medial and posterior aspects of her right elbow.  She points to the medial epicondyle and the olecranon.  The pain is worsened with any ROM.  She has tried to control the discomfort with anti-inflammatories but has not gotten much relief.  She has not noticed any redness or swelling in the joint.  Outpatient Medications Prior to Visit  Medication Sig Dispense Refill  . diclofenac (VOLTAREN) 50 MG EC tablet Take 1 tablet (50 mg total) by mouth 2 (two) times daily. 180 tablet 1  . fluocinonide cream (LIDEX) 0.05 % Apply 1 application topically 2 (two) times daily. 60 g 2  . SUMAtriptan (IMITREX) 100 MG tablet Take 1 tablet (100 mg total) by mouth every 2 (two) hours as needed for migraine. May repeat in 2 hours if headache persists or recurs. 10 tablet 5  . Vitamin D, Ergocalciferol, (DRISDOL) 1.25 MG (50000 UT) CAPS capsule Take 1 capsule (50,000 Units total) by mouth every 7 (seven) days. 4 capsule 0  . buPROPion (WELLBUTRIN XL) 150 MG 24 hr tablet Take 1 tablet (150 mg total) by mouth daily. (Patient not taking: Reported on 07/19/2018) 90 tablet 1  . Dietary Management Product (VASCULERA) TABS Take 1 capsule by mouth daily. (Patient not taking: Reported on 07/19/2018) 30 tablet 11   No facility-administered medications prior to visit.     ROS Review of Systems  Constitutional: Negative for chills and fever.  Musculoskeletal: Positive for arthralgias.  Skin: Negative for color change, pallor and rash.  Hematological: Negative.   All other systems reviewed and are negative.   Objective:  BP 106/64 (BP Location: Left Arm, Patient Position: Sitting,  Cuff Size: Large)   Pulse 63   Temp 98.2 F (36.8 C) (Oral)   Resp 16   Ht 5\' 6"  (1.676 m)   Wt 193 lb (87.5 kg)   LMP 07/11/2018 (Approximate)   SpO2 95%   BMI 31.15 kg/m   BP Readings from Last 3 Encounters:  07/19/18 106/64  06/12/18 104/67  05/23/18 102/68    Wt Readings from Last 3 Encounters:  07/19/18 193 lb (87.5 kg)  06/12/18 191 lb (86.6 kg)  05/23/18 191 lb (86.6 kg)    Physical Exam Musculoskeletal:     Right elbow: She exhibits normal range of motion, no swelling and no deformity. Tenderness found. Medial epicondyle and olecranon process tenderness noted. No radial head and no lateral epicondyle tenderness noted.     Comments: There is pain with any ROM in the right elbow     Lab Results  Component Value Date   WBC 9.2 04/24/2018   HGB 14.0 04/24/2018   HCT 42.0 04/24/2018   PLT 244.0 10/31/2017   GLUCOSE 92 04/24/2018   CHOL 168 04/24/2018   TRIG 54 04/24/2018   HDL 54 04/24/2018   LDLCALC 103 (H) 04/24/2018   ALT 12 04/24/2018   AST 14 04/24/2018   NA 139 04/24/2018   K 4.4 04/24/2018   CL 102 04/24/2018   CREATININE 0.73 04/24/2018   BUN 11 04/24/2018   CO2 25 04/24/2018   TSH 1.030 04/24/2018   HGBA1C 5.5 04/24/2018  Dg Chest 2 View  Result Date: 01/30/2018 CLINICAL DATA:  Pneumonia, cough EXAM: CHEST - 2 VIEW COMPARISON:  01/22/2018 FINDINGS: There is improvement in the left upper lobe patchy nodular mild airspace process. Minor residual streaky opacity in the left upper lobe may represent residual scarring/atelectasis. Normal heart size and vascularity. No other acute airspace process, collapse, consolidation, edema, effusion or pneumothorax. Trachea is midline. IMPRESSION: Improvement in the left upper lobe patchy nodular airspace process compatible with resolving pneumonia. Electronically Signed   By: Judie PetitM.  Shick M.D.   On: 01/30/2018 09:11   Dg Elbow Complete Right  Result Date: 07/19/2018 CLINICAL DATA:  Right elbow pain for 7 days.   No known injury. EXAM: RIGHT ELBOW - COMPLETE 3+ VIEW COMPARISON:  None. FINDINGS: There is no evidence of fracture, dislocation, or joint effusion. There is no evidence of arthropathy or other focal bone abnormality. Soft tissues are unremarkable. IMPRESSION: Negative. Electronically Signed   By: Francene BoyersJames  Maxwell M.D.   On: 07/19/2018 11:24     Assessment & Plan:   Christina [name] was seen today for elbow pain.  Diagnoses and all orders for this visit:  Elbow pain, right- The exam is concerning for olecranon bursitis or medial epicondylitis status post repetitive activity.  Plain film is negative for fracture or effusion.  She has not gotten much symptom relief with anti-inflammatories so I recommended an injection of methylprednisolone.  I have asked her to continue taking the current anti-inflammatory and to ice and rest the elbow as much as possible for the next 2 weeks. -     DG Elbow Complete Right; Future  Olecranon bursitis of right elbow -     methylPREDNISolone acetate (DEPO-MEDROL) injection 120 mg   I am having Christina [name] maintain her Vasculera, SUMAtriptan, fluocinonide cream, buPROPion, diclofenac, and Vitamin D (Ergocalciferol). We administered methylPREDNISolone acetate.  Meds ordered this encounter  Medications  . methylPREDNISolone acetate (DEPO-MEDROL) injection 120 mg     Follow-up: Return if symptoms worsen or fail to improve.  Sanda Lingerhomas Zalen Sequeira, MD

## 2018-07-19 NOTE — Patient Instructions (Signed)
Elbow Bursitis  Bursitis is swelling and pain at the tip of your elbow. This happens when fluid builds up in a sac under your skin (bursa). This may also be called olecranon bursitis.  Follow these instructions at home:  Medicines   Take over-the-counter and prescription medicines only as told by your doctor.   If you were prescribed an antibiotic, take it exactly as told by your doctor. Do not stop taking it even if you start to feel better.  Managing pain, stiffness, and swelling     If told, put ice on your elbow:  ? Put ice in a plastic bag.  ? Place a towel between your skin and the bag.  ? Leave the ice on for 20 minutes, 2-3 times a day.   If your bursitis is caused by an injury, follow instructions from your doctor about:  ? Resting your elbow.  ? Wearing a bandage.   Wear elbow pads or elbow wraps as needed. These help cushion your elbow.  General instructions   Avoid any activities that cause elbow pain. Ask your doctor what activities are safe for you.   Keep all follow-up visits as told by your doctor. This is important.  Contact a doctor if you have:   A fever.   Problems that do not get better with treatment.   Pain or swelling that:  ? Gets worse.  ? Goes away and then comes back.   Pus draining from your elbow.  Get help right away if you have:   Trouble moving your arm, hand, or fingers.  Summary   Bursitis is swelling and pain at the tip of the elbow.   You may need to take medicine or put ice on your elbow.   Contact your doctor if your problems do not get better with treatment.  This information is not intended to replace advice given to you by your health care provider. Make sure you discuss any questions you have with your health care provider.  Document Released: 08/26/2009 Document Revised: 02/15/2017 Document Reviewed: 02/15/2017  Elsevier Interactive Patient Education  2019 [employer]

## 2018-07-24 ENCOUNTER — Encounter (INDEPENDENT_AMBULATORY_CARE_PROVIDER_SITE_OTHER): Payer: Self-pay | Admitting: Family Medicine

## 2018-07-24 ENCOUNTER — Other Ambulatory Visit: Payer: Self-pay

## 2018-07-24 ENCOUNTER — Ambulatory Visit (INDEPENDENT_AMBULATORY_CARE_PROVIDER_SITE_OTHER): Payer: 59 | Admitting: Family Medicine

## 2018-07-24 DIAGNOSIS — E559 Vitamin D deficiency, unspecified: Secondary | ICD-10-CM | POA: Diagnosis not present

## 2018-07-24 DIAGNOSIS — Z6831 Body mass index (BMI) 31.0-31.9, adult: Secondary | ICD-10-CM | POA: Diagnosis not present

## 2018-07-24 DIAGNOSIS — E669 Obesity, unspecified: Secondary | ICD-10-CM

## 2018-07-24 DIAGNOSIS — E7849 Other hyperlipidemia: Secondary | ICD-10-CM

## 2018-07-24 NOTE — Progress Notes (Signed)
Office: [phone]  /  Fax: [phone] TeleHealth Visit:  Christina [name] has verbally consented to this TeleHealth visit today. The patient is located at home, the provider is located at the [facility] office. The participants in this visit include the listed provider and patient. The visit was conducted today via webex.  HPI:   Chief Complaint: OBESITY Christina [name] is here to discuss her progress with her obesity treatment plan. She is on the Category 2 plan and is following her eating plan approximately 90 % of the time. She states she is walking for 30 minutes 7 times per week. Christina [name] is still having to go into the office 3 times per week. She is doing outdoor activities to stay active and decompress. She likes the meal plan and doesn't have to think about it. Her weight is 192.2 lbs.  We were unable to weigh the patient today for this TeleHealth visit. She feels as if she has maintained her weight since her last visit. She has lost 0 lbs since starting treatment with [name].  Vitamin D Deficiency Christina [name] has a diagnosis of vitamin D deficiency. She is currently taking prescription Vit D. She notes fatigue and denies nausea, vomiting or muscle weakness.  Hyperlipidemia Christina [name] has hyperlipidemia and has been trying to improve her cholesterol levels with intensive lifestyle modification including a low saturated fat diet, exercise and weight loss. Last LDL was of 103 and she is not on statin. She denies any chest pain, claudication or myalgias.  ASSESSMENT AND PLAN:  Vitamin D deficiency  Other hyperlipidemia  Class 1 obesity with serious comorbidity and body mass index (BMI) of 31.0 to 31.9 in adult, unspecified obesity type  PLAN:  Vitamin D Deficiency Christina [name] was informed that low vitamin D levels contributes to fatigue and are associated with obesity, breast, and colon cancer. Christina [name] agrees to continue taking prescription Vit D @50 ,000  IU every week and will follow up for routine testing of vitamin D, at least 2-3 times per year. She was informed of the risk of over-replacement of vitamin D and agrees to not increase her dose unless she discusses this with [name] first. Christina [name] agrees to follow up with our clinic in 2 weeks.  Hyperlipidemia Christina [name] was informed of the American Heart Association Guidelines emphasizing intensive lifestyle modifications as the first line treatment for hyperlipidemia. We discussed many lifestyle modifications today in depth, and Christina [name] will continue to work on decreasing saturated fats such as fatty red meat, butter and many fried foods. She will also increase vegetables and lean protein in her diet and continue to work on exercise and weight loss efforts. We will repeat labs at next appointment. Christina [name] agrees to follow up with our clinic in 2 weeks.  Obesity Christina [name] is currently in the action stage of change. As such, her goal is to continue with weight loss efforts She has agreed to follow the Category 2 plan Christina [name] has been instructed to work up to a goal of 150 minutes of combined cardio and strengthening exercise per week or continue physical activity as per the last 2 weeks for weight loss and overall health benefits. We discussed the following Behavioral Modification Strategies today: increasing lean protein intake, increasing vegetables and work on meal planning and easy cooking plans, keeping healthy foods in the home, and planning for success   Christina [name] has agreed to follow up with our clinic in 2 weeks. She was informed of the importance of frequent follow up visits to  maximize her success with intensive lifestyle modifications for her multiple health conditions.  ALLERGIES: Allergies  Allergen Reactions  . Hydrocodone Itching  . Prednisone Other (See Comments)    Hyper, agitated, aggressive    MEDICATIONS: Current Outpatient Medications on File Prior to Visit   Medication Sig Dispense Refill  . buPROPion (WELLBUTRIN XL) 150 MG 24 hr tablet Take 1 tablet (150 mg total) by mouth daily. (Patient not taking: Reported on 07/19/2018) 90 tablet 1  . diclofenac (VOLTAREN) 50 MG EC tablet Take 1 tablet (50 mg total) by mouth 2 (two) times daily. 180 tablet 1  . Dietary Management Product (VASCULERA) TABS Take 1 capsule by mouth daily. (Patient not taking: Reported on 07/19/2018) 30 tablet 11  . fluocinonide cream (LIDEX) 0.05 % Apply 1 application topically 2 (two) times daily. 60 g 2  . SUMAtriptan (IMITREX) 100 MG tablet Take 1 tablet (100 mg total) by mouth every 2 (two) hours as needed for migraine. May repeat in 2 hours if headache persists or recurs. 10 tablet 5  . Vitamin D, Ergocalciferol, (DRISDOL) 1.25 MG (50000 UT) CAPS capsule Take 1 capsule (50,000 Units total) by mouth every 7 (seven) days. 4 capsule 0   No current facility-administered medications on file prior to visit.     PAST MEDICAL HISTORY: Past Medical History:  Diagnosis Date  . Back pain   . Eczema   . Major depressive disorder   . Migraine   . Normal pregnancy 03/11/2012  . Ovarian cyst   . Postpartum hemorrhage   . SVD (spontaneous vaginal delivery) 03/11/2012    PAST SURGICAL HISTORY: History reviewed. No pertinent surgical history.  SOCIAL HISTORY: Social History   Tobacco Use  . Smoking status: Current Every Day Smoker    Packs/day: 0.50    Years: 20.00    Pack years: 10.00    Types: Cigarettes  . Smokeless tobacco: Never Used  Substance Use Topics  . Alcohol use: Yes    Alcohol/week: 0.0 standard drinks    Comment: occassionally for social functions  . Drug use: No    FAMILY HISTORY: Family History  Problem Relation Age of Onset  . Arthritis Father   . Sudden death Father [id number]       Sinus Cancer  . Cancer Father        nasal cancer  . Bipolar disorder Mother   . Depression Mother   . Anxiety disorder Mother   . Alcohol abuse Mother   . Drug abuse  Mother   . Obesity Mother   . Sudden death Brother        GSW  . Arthritis Maternal Grandfather   . Hyperlipidemia Maternal Grandfather   . Heart disease Maternal Grandfather   . Breast cancer Maternal Grandmother   . Arthritis Maternal Grandmother   . Lung cancer Maternal Grandmother   . Heart disease Maternal Grandmother   . Hyperlipidemia Paternal Grandmother   . Stroke Paternal Grandfather     ROS: Review of Systems  Constitutional: Positive for malaise/fatigue. Negative for weight loss.  Cardiovascular: Negative for chest pain and claudication.  Gastrointestinal: Negative for nausea and vomiting.  Musculoskeletal: Negative for myalgias.       Negative muscle weakness    PHYSICAL EXAM: Pt in no acute distress  RECENT LABS AND TESTS: BMET    Component Value Date/Time   NA 139 04/24/2018 1413   K 4.4 04/24/2018 1413   CL 102 04/24/2018 1413   CO2 25 04/24/2018 1413  GLUCOSE 92 04/24/2018 1413   GLUCOSE 76 10/31/2017 1659   BUN 11 04/24/2018 1413   CREATININE 0.73 04/24/2018 1413   CALCIUM 9.1 04/24/2018 1413   GFRNONAA 106 04/24/2018 1413   GFRAA 122 04/24/2018 1413   Lab Results  Component Value Date   HGBA1C 5.5 04/24/2018   HGBA1C 5.5 08/29/2014   Lab Results  Component Value Date   INSULIN 6.7 04/24/2018   CBC    Component Value Date/Time   WBC 9.2 04/24/2018 1413   WBC 8.7 10/31/2017 1659   RBC 4.63 04/24/2018 1413   RBC 4.55 10/31/2017 1659   HGB 14.0 04/24/2018 1413   HCT 42.0 04/24/2018 1413   PLT 244.0 10/31/2017 1659   MCV 91 04/24/2018 1413   MCH 30.2 04/24/2018 1413   MCH 30.0 01/03/2014 1558   MCHC 33.3 04/24/2018 1413   MCHC 34.1 10/31/2017 1659   RDW 12.9 04/24/2018 1413   LYMPHSABS 2.0 04/24/2018 1413   MONOABS 0.6 10/31/2017 1659   EOSABS 0.2 04/24/2018 1413   BASOSABS 0.0 04/24/2018 1413   Iron/TIBC/Ferritin/ %Sat No results found for: IRON, TIBC, FERRITIN, IRONPCTSAT Lipid Panel     Component Value Date/Time   CHOL  168 04/24/2018 1413   TRIG 54 04/24/2018 1413   HDL 54 04/24/2018 1413   CHOLHDL 3 10/31/2017 1418   VLDL 15.0 10/31/2017 1418   LDLCALC 103 (H) 04/24/2018 1413   Hepatic Function Panel     Component Value Date/Time   PROT 6.8 04/24/2018 1413   ALBUMIN 4.5 04/24/2018 1413   AST 14 04/24/2018 1413   ALT 12 04/24/2018 1413   ALKPHOS 55 04/24/2018 1413   BILITOT 0.4 04/24/2018 1413      Component Value Date/Time   TSH 1.030 04/24/2018 1413   TSH 1.32 10/31/2017 1659   TSH 1.27 10/12/2016 0830      I, Burt Knack, am acting as transcriptionist for Debbra Riding, MD  I have reviewed the above documentation for accuracy and completeness, and I agree with the above. - Debbra Riding, MD

## 2018-08-08 ENCOUNTER — Ambulatory Visit (INDEPENDENT_AMBULATORY_CARE_PROVIDER_SITE_OTHER): Payer: 59 | Admitting: Family Medicine

## 2018-08-08 ENCOUNTER — Other Ambulatory Visit: Payer: Self-pay

## 2018-08-08 DIAGNOSIS — E559 Vitamin D deficiency, unspecified: Secondary | ICD-10-CM | POA: Diagnosis not present

## 2018-08-08 DIAGNOSIS — Z6830 Body mass index (BMI) 30.0-30.9, adult: Secondary | ICD-10-CM

## 2018-08-08 DIAGNOSIS — E8881 Metabolic syndrome: Secondary | ICD-10-CM

## 2018-08-08 DIAGNOSIS — E669 Obesity, unspecified: Secondary | ICD-10-CM

## 2018-08-08 NOTE — Progress Notes (Signed)
Office: [phone]  /  Fax: [phone] TeleHealth Visit:  [name] has verbally consented to this TeleHealth visit today. The patient is located at home, the provider is located at the [facility] office. The participants in this visit include the listed provider and patient. The visit was conducted today via webex.  HPI:   Chief Complaint: OBESITY Genelle is here to discuss her progress with her obesity treatment plan. She is on the Category 2 plan and is following her eating plan approximately 50 % of the time. She states she is doing yard work. Kerith is feeling that it is difficult to follow the plan given all temptation at work and lack of motivation. 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  We were unable to weigh the patient today for this TeleHealth visit. She feels as if she has maintained her weight since her last visit. She has lost 0 lbs since starting treatment with [name].  Insulin Resistance Arvada has a diagnosis of insulin resistance based on her elevated fasting insulin level >5. Although Navdeep's blood glucose readings are still under good control, insulin resistance puts her at greater risk of metabolic syndrome and diabetes. She notes carbohydrate cravings and she is not on any medications. She continues to work on diet and exercise to decrease risk of diabetes.  Vitamin D Deficiency Korie has a diagnosis of vitamin D deficiency. She is currently taking prescription Vit D. She notes fatigue and denies nausea, vomiting or muscle weakness.  ASSESSMENT AND PLAN:  Insulin resistance  Vitamin D deficiency  Class 1 obesity with serious comorbidity and body mass index (BMI) of 30.0 to 30.9 in adult, unspecified obesity type  PLAN:  Insulin Resistance Oline will continue her Category 2 plan, and will continue to work on weight loss, exercise, and decreasing simple  carbohydrates in her diet to help decrease the risk of diabetes. We dicussed metformin including benefits and risks. She was informed that eating too many simple carbohydrates or too many calories at one sitting increases the likelihood of GI side effects. Imogean declined metformin for now and prescription was not written today. We will repeat labs at her first in person appointment. Kayti agrees to follow up with our clinic in 2 weeks as directed to monitor her progress.  Vitamin D Deficiency Ambreen was informed that low vitamin D levels contributes to fatigue and are associated with obesity, breast, and colon cancer. Tiffiny agrees to continue taking prescription Vit D @50 ,000 IU every week and will follow up for routine testing of vitamin D, at least 2-3 times per year. She was informed of the risk of over-replacement of vitamin D and agrees to not increase her dose unless she discusses this with [name] first. Tawyna agrees to follow up with our clinic in 2 weeks.  Obesity Jenilyn is currently in the action stage of change. As such, her goal is to continue with weight loss efforts She has agreed to follow the Category 2 plan Lizelle has been instructed to work up to a goal of 150 minutes of combined cardio and strengthening exercise per week for weight loss and overall health benefits. We discussed the following Behavioral Modification Strategies today: increasing lean protein intake, increasing vegetables, work on meal planning and easy cooking plans, dealing with family or coworker sabotage, avoiding temptations, keeping healthy foods in the home, and better snacking choices   Liliane has agreed to follow up  with our clinic in 2 weeks. She was informed of the importance of frequent follow up visits to maximize her success with intensive lifestyle modifications for her multiple health conditions.  ALLERGIES: Allergies  Allergen Reactions  . Hydrocodone Itching  . Prednisone  Other (See Comments)    Hyper, agitated, aggressive    MEDICATIONS: Current Outpatient Medications on File Prior to Visit  Medication Sig Dispense Refill  . buPROPion (WELLBUTRIN XL) 150 MG 24 hr tablet Take 1 tablet (150 mg total) by mouth daily. (Patient not taking: Reported on 07/19/2018) 90 tablet 1  . diclofenac (VOLTAREN) 50 MG EC tablet Take 1 tablet (50 mg total) by mouth 2 (two) times daily. 180 tablet 1  . Dietary Management Product (VASCULERA) TABS Take 1 capsule by mouth daily. (Patient not taking: Reported on 07/19/2018) 30 tablet 11  . fluocinonide cream (LIDEX) 0.05 % Apply 1 application topically 2 (two) times daily. 60 g 2  . SUMAtriptan (IMITREX) 100 MG tablet Take 1 tablet (100 mg total) by mouth every 2 (two) hours as needed for migraine. May repeat in 2 hours if headache persists or recurs. 10 tablet 5  . Vitamin D, Ergocalciferol, (DRISDOL) 1.25 MG (50000 UT) CAPS capsule Take 1 capsule (50,000 Units total) by mouth every 7 (seven) days. 4 capsule 0   No current facility-administered medications on file prior to visit.     PAST MEDICAL HISTORY: Past Medical History:  Diagnosis Date  . Back pain   . Eczema   . Major depressive disorder   . Migraine   . Normal pregnancy 03/11/2012  . Ovarian cyst   . Postpartum hemorrhage   . SVD (spontaneous vaginal delivery) 03/11/2012    PAST SURGICAL HISTORY: No past surgical history on file.  SOCIAL HISTORY: Social History   Tobacco Use  . Smoking status: Current Every Day Smoker    Packs/day: 0.50    Years: 20.00    Pack years: 10.00    Types: Cigarettes  . Smokeless tobacco: Never Used  Substance Use Topics  . Alcohol use: Yes    Alcohol/week: 0.0 standard drinks    Comment: occassionally for social functions  . Drug use: No    FAMILY HISTORY: Family History  Problem Relation Age of Onset  . Arthritis Father   . Sudden death Father 47       Sinus Cancer  . Cancer Father        nasal cancer  . Bipolar  disorder Mother   . Depression Mother   . Anxiety disorder Mother   . Alcohol abuse Mother   . Drug abuse Mother   . Obesity Mother   . Sudden death Brother        GSW  . Arthritis Maternal Grandfather   . Hyperlipidemia Maternal Grandfather   . Heart disease Maternal Grandfather   . Breast cancer Maternal Grandmother   . Arthritis Maternal Grandmother   . Lung cancer Maternal Grandmother   . Heart disease Maternal Grandmother   . Hyperlipidemia Paternal Grandmother   . Stroke Paternal Grandfather     ROS: Review of Systems  Constitutional: Positive for malaise/fatigue. Negative for weight loss.  Gastrointestinal: Negative for nausea and vomiting.  Musculoskeletal:       Negative muscle weakness    PHYSICAL EXAM: Pt in no acute distress  RECENT LABS AND TESTS: BMET    Component Value Date/Time   NA 139 04/24/2018 1413   K 4.4 04/24/2018 1413   CL 102 04/24/2018 1413  CO2 25 04/24/2018 1413   GLUCOSE 92 04/24/2018 1413   GLUCOSE 76 10/31/2017 1659   BUN 11 04/24/2018 1413   CREATININE 0.73 04/24/2018 1413   CALCIUM 9.1 04/24/2018 1413   GFRNONAA 106 04/24/2018 1413   GFRAA 122 04/24/2018 1413   Lab Results  Component Value Date   HGBA1C 5.5 04/24/2018   HGBA1C 5.5 08/29/2014   Lab Results  Component Value Date   INSULIN 6.7 04/24/2018   CBC    Component Value Date/Time   WBC 9.2 04/24/2018 1413   WBC 8.7 10/31/2017 1659   RBC 4.63 04/24/2018 1413   RBC 4.55 10/31/2017 1659   HGB 14.0 04/24/2018 1413   HCT 42.0 04/24/2018 1413   PLT 244.0 10/31/2017 1659   MCV 91 04/24/2018 1413   MCH 30.2 04/24/2018 1413   MCH 30.0 01/03/2014 1558   MCHC 33.3 04/24/2018 1413   MCHC 34.1 10/31/2017 1659   RDW 12.9 04/24/2018 1413   LYMPHSABS 2.0 04/24/2018 1413   MONOABS 0.6 10/31/2017 1659   EOSABS 0.2 04/24/2018 1413   BASOSABS 0.0 04/24/2018 1413   Iron/TIBC/Ferritin/ %Sat No results found for: IRON, TIBC, FERRITIN, IRONPCTSAT Lipid Panel     Component  Value Date/Time   CHOL 168 04/24/2018 1413   TRIG 54 04/24/2018 1413   HDL 54 04/24/2018 1413   CHOLHDL 3 10/31/2017 1418   VLDL 15.0 10/31/2017 1418   LDLCALC 103 (H) 04/24/2018 1413   Hepatic Function Panel     Component Value Date/Time   PROT 6.8 04/24/2018 1413   ALBUMIN 4.5 04/24/2018 1413   AST 14 04/24/2018 1413   ALT 12 04/24/2018 1413   ALKPHOS 55 04/24/2018 1413   BILITOT 0.4 04/24/2018 1413      Component Value Date/Time   TSH 1.030 04/24/2018 1413   TSH 1.32 10/31/2017 1659   TSH 1.27 10/12/2016 0830      I, Burt KnackSharon Martin, am acting as transcriptionist for Debbra RidingAlexandria Kadolph, MD   I have reviewed the above documentation for accuracy and completeness, and I agree with the above. - Debbra RidingAlexandria Kadolph, MD

## 2018-08-09 ENCOUNTER — Encounter (INDEPENDENT_AMBULATORY_CARE_PROVIDER_SITE_OTHER): Payer: Self-pay | Admitting: Family Medicine

## 2018-08-22 ENCOUNTER — Ambulatory Visit (INDEPENDENT_AMBULATORY_CARE_PROVIDER_SITE_OTHER): Payer: 59 | Admitting: Family Medicine

## 2018-08-24 ENCOUNTER — Encounter: Payer: Self-pay | Admitting: Family Medicine

## 2018-08-24 ENCOUNTER — Ambulatory Visit (INDEPENDENT_AMBULATORY_CARE_PROVIDER_SITE_OTHER): Payer: 59 | Admitting: Family Medicine

## 2018-08-24 ENCOUNTER — Other Ambulatory Visit: Payer: Self-pay

## 2018-08-24 DIAGNOSIS — M999 Biomechanical lesion, unspecified: Secondary | ICD-10-CM

## 2018-08-24 NOTE — Assessment & Plan Note (Signed)
Decision today to treat with OMT was based on Physical Exam  After verbal consent patient was treated with HVLA, ME, FPR techniques in cervical, thoracic, lumbar and sacral areas  Patient tolerated the procedure well with improvement in symptoms  Patient given exercises, stretches and lifestyle modifications  See medications in patient instructions if given  Patient will follow up in prn

## 2018-08-24 NOTE — Progress Notes (Signed)
Procedure note  Osteopathic findings Sacrum right on right

## 2018-08-28 ENCOUNTER — Encounter (INDEPENDENT_AMBULATORY_CARE_PROVIDER_SITE_OTHER): Payer: Self-pay

## 2018-08-29 ENCOUNTER — Encounter (INDEPENDENT_AMBULATORY_CARE_PROVIDER_SITE_OTHER): Payer: Self-pay | Admitting: Family Medicine

## 2018-08-29 ENCOUNTER — Ambulatory Visit (INDEPENDENT_AMBULATORY_CARE_PROVIDER_SITE_OTHER): Payer: 59 | Admitting: Family Medicine

## 2018-08-29 VITALS — BP 101/66 | HR 81 | Temp 98.2°F | Ht 66.0 in | Wt 189.0 lb

## 2018-08-29 DIAGNOSIS — E559 Vitamin D deficiency, unspecified: Secondary | ICD-10-CM

## 2018-08-29 DIAGNOSIS — Z6830 Body mass index (BMI) 30.0-30.9, adult: Secondary | ICD-10-CM | POA: Diagnosis not present

## 2018-08-29 DIAGNOSIS — Z9189 Other specified personal risk factors, not elsewhere classified: Secondary | ICD-10-CM

## 2018-08-29 DIAGNOSIS — E669 Obesity, unspecified: Secondary | ICD-10-CM

## 2018-08-29 MED ORDER — VITAMIN D (ERGOCALCIFEROL) 1.25 MG (50000 UNIT) PO CAPS: 50000.0000 [IU] | ORAL_CAPSULE | ORAL | 0 refills | Status: DC

## 2018-08-29 MED FILL — VIT D2 1.25 MG (50,000 UNIT: 1.25 MG | 28 days supply | Qty: 4 | Fill #0

## 2018-08-30 NOTE — Progress Notes (Signed)
Office: [phone]  /  Fax: [phone]   HPI:   Chief Complaint: OBESITY Christina [name] is here to discuss her progress with her obesity treatment plan. She is on the  follow the Category 2 plan and follow the Prepacked eating plan and is following her eating plan approximately 85 % of the time. She states she is exercising by walking for 50 minutes 5 times per week. Christina [name] continues to do well with weight loss and is down 2 lbs. Her hunger is controlled and she is tolerating her plan well overall. She sometimes struggles to eat all her foods.   Her weight is 189 lb (85.7 kg) today and has had a weight loss of 2 pounds over a period of 2 weeks since her last visit. She has lost 2 lbs since starting treatment with [name].  Vitamin D deficiency Christina [name] has a diagnosis of vitamin D deficiency. She is currently taking vit D and denies nausea, vomiting or muscle weakness.  At risk for osteopenia and osteoporosis Christina [name] is at higher risk of osteopenia and osteoporosis due to vitamin D deficiency.   ASSESSMENT AND PLAN:  Vitamin D deficiency - Plan: Vitamin D, Ergocalciferol, (DRISDOL) 1.25 MG (50000 UT) CAPS capsule  At risk for osteoporosis  Class 1 obesity with serious comorbidity and body mass index (BMI) of 30.0 to 30.9 in adult, unspecified obesity type  PLAN: Vitamin D Deficiency Christina [name] was informed that low vitamin D levels contributes to fatigue and are associated with obesity, breast, and colon cancer. She agrees to continue to take prescription Vit D @50 ,000 IU every week and will follow up for routine testing of vitamin D, at least 2-3 times per year. She was informed of the risk of over-replacement of vitamin D and agrees to not increase her dose unless she discusses this with [name] first.  At risk for osteopenia and osteoporosis Christina [name] was given extended  (15 minutes) osteoporosis prevention counseling today. Christina [name] is at risk for osteopenia and osteoporsis due to  her vitamin D deficiency. She was encouraged to take her vitamin D and follow her higher calcium diet and increase strengthening exercise to help strengthen her bones and decrease her risk of osteopenia and osteoporosis.  Obesity Christina [name] is currently in the action stage of change. As such, her goal is to continue with weight loss efforts She has agreed to follow the Category 2 plan Christina [name] has been instructed to work up to a goal of 150 minutes of combined cardio and strengthening exercise per week for weight loss and overall health benefits. We discussed the following Behavioral Modification Stratagies today: increasing lean protein intake, decreasing simple carbohydrates  and work on meal planning and easy cooking plans   Christina [name] has agreed to follow up with our clinic in 2 weeks. She was informed of the importance of frequent follow up visits to maximize her success with intensive lifestyle modifications for her multiple health conditions.  ALLERGIES: Allergies  Allergen Reactions  . Hydrocodone Itching  . Prednisone Other (See Comments)    Hyper, agitated, aggressive    MEDICATIONS: Current Outpatient Medications on File Prior to Visit  Medication Sig Dispense Refill  . diclofenac (VOLTAREN) 50 MG EC tablet Take 1 tablet (50 mg total) by mouth 2 (two) times daily. 180 tablet 1  . fluocinonide cream (LIDEX) 8.67 % Apply 1 application topically 2 (two) times daily. 60 g 2  . SUMAtriptan (IMITREX) 100 MG tablet Take 1 tablet (100 mg total) by mouth every 2 (two) hours as  needed for migraine. May repeat in 2 hours if headache persists or recurs. 10 tablet 5   No current facility-administered medications on file prior to visit.     PAST MEDICAL HISTORY: Past Medical History:  Diagnosis Date  . Back pain   . Eczema   . Major depressive disorder   . Migraine   . Normal pregnancy 03/11/2012  . Ovarian cyst   . Postpartum hemorrhage   . SVD (spontaneous vaginal delivery)  03/11/2012    PAST SURGICAL HISTORY: History reviewed. No pertinent surgical history.  SOCIAL HISTORY: Social History   Tobacco Use  . Smoking status: Current Every Day Smoker    Packs/day: 0.50    Years: 20.00    Pack years: 10.00    Types: Cigarettes  . Smokeless tobacco: Never Used  Substance Use Topics  . Alcohol use: Yes    Alcohol/week: 0.0 standard drinks    Comment: occassionally for social functions  . Drug use: No    FAMILY HISTORY: Family History  Problem Relation Age of Onset  . Arthritis Father   . Sudden death Father [phone]       Sinus Cancer  . Cancer Father        nasal cancer  . Bipolar disorder Mother   . Depression Mother   . Anxiety disorder Mother   . Alcohol abuse Mother   . Drug abuse Mother   . Obesity Mother   . Sudden death Brother        GSW  . Arthritis Maternal Grandfather   . Hyperlipidemia Maternal Grandfather   . Heart disease Maternal Grandfather   . Breast cancer Maternal Grandmother   . Arthritis Maternal Grandmother   . Lung cancer Maternal Grandmother   . Heart disease Maternal Grandmother   . Hyperlipidemia Paternal Grandmother   . Stroke Paternal Grandfather     ROS: Review of Systems  Constitutional: Positive for weight loss.  Gastrointestinal: Negative for nausea and vomiting.  Musculoskeletal:       Negative for muscle weakness    PHYSICAL EXAM: Blood pressure 101/66, pulse 81, temperature 98.2 F (36.8 C), temperature source Oral, height 5\' 6"  (1.676 m), weight 189 lb (85.7 kg), last menstrual period 08/07/2018, SpO2 97 %. Body mass index is 30.51 kg/m. Physical Exam Vitals signs reviewed.  Constitutional:      Appearance: Normal appearance. She is obese.  HENT:     Head: Normocephalic.     Nose: Nose normal.  Neck:     Musculoskeletal: Normal range of motion.  Cardiovascular:     Rate and Rhythm: Normal rate.  Pulmonary:     Effort: Pulmonary effort is normal.  Musculoskeletal: Normal range of motion.   Skin:    General: Skin is warm and dry.  Neurological:     Mental Status: She is alert and oriented to person, place, and time.  Psychiatric:        Mood and Affect: Mood normal.        Behavior: Behavior normal.     RECENT LABS AND TESTS: BMET    Component Value Date/Time   NA 139 04/24/2018 1413   K 4.4 04/24/2018 1413   CL 102 04/24/2018 1413   CO2 25 04/24/2018 1413   GLUCOSE 92 04/24/2018 1413   GLUCOSE 76 10/31/2017 1659   BUN 11 04/24/2018 1413   CREATININE 0.73 04/24/2018 1413   CALCIUM 9.1 04/24/2018 1413   GFRNONAA 106 04/24/2018 1413   GFRAA 122 04/24/2018 1413   Lab  Results  Component Value Date   HGBA1C 5.5 04/24/2018   HGBA1C 5.5 08/29/2014   Lab Results  Component Value Date   INSULIN 6.7 04/24/2018   CBC    Component Value Date/Time   WBC 9.2 04/24/2018 1413   WBC 8.7 10/31/2017 1659   RBC 4.63 04/24/2018 1413   RBC 4.55 10/31/2017 1659   HGB 14.0 04/24/2018 1413   HCT 42.0 04/24/2018 1413   PLT 244.0 10/31/2017 1659   MCV 91 04/24/2018 1413   MCH 30.2 04/24/2018 1413   MCH 30.0 01/03/2014 1558   MCHC 33.3 04/24/2018 1413   MCHC 34.1 10/31/2017 1659   RDW 12.9 04/24/2018 1413   LYMPHSABS 2.0 04/24/2018 1413   MONOABS 0.6 10/31/2017 1659   EOSABS 0.2 04/24/2018 1413   BASOSABS 0.0 04/24/2018 1413   Iron/TIBC/Ferritin/ %Sat No results found for: IRON, TIBC, FERRITIN, IRONPCTSAT Lipid Panel     Component Value Date/Time   CHOL 168 04/24/2018 1413   TRIG 54 04/24/2018 1413   HDL 54 04/24/2018 1413   CHOLHDL 3 10/31/2017 1418   VLDL 15.0 10/31/2017 1418   LDLCALC 103 (H) 04/24/2018 1413   Hepatic Function Panel     Component Value Date/Time   PROT 6.8 04/24/2018 1413   ALBUMIN 4.5 04/24/2018 1413   AST 14 04/24/2018 1413   ALT 12 04/24/2018 1413   ALKPHOS 55 04/24/2018 1413   BILITOT 0.4 04/24/2018 1413      Component Value Date/Time   TSH 1.030 04/24/2018 1413   TSH 1.32 10/31/2017 1659   TSH 1.27 10/12/2016 0830      Ref. Range 04/24/2018 14:13  Vitamin D, 25-Hydroxy Latest Ref Range: 30.0 - 100.0 ng/mL 32.2     OBESITY BEHAVIORAL INTERVENTION VISIT  Today's visit was # 9   Starting weight: 191 lb Starting date: 04/24/18 Today's weight : Weight: 189 lb (85.7 kg)  Today's date: 08/29/18 Total lbs lost to date: 2 lb At least 15 minutes were spent on discussing the following behavioral intervention visit.   ASK: We discussed the diagnosis of obesity with Christina [name] today and Christina [name] agreed to give us permission to discuss obesity behavioral modification therapy today.  ASSESS: Christina [name] has the diagnosis of obesity and her BMI today is 30.52 Christina [name] is in the action stage of change   ADVISE: Christina [name] was educated on the multiple health risks of obesity as well as the benefit of weight loss to improve her health. She was advised of the need for long term treatment and the importance of lifestyle modifications to improve her current health and to decrease her risk of future health problems.  AGREE: Multiple dietary modification options and treatment options were discussed and  Christina [name] agreed to follow the recommendations documented in the above note.  ARRANGE: Christina [name] was educated on the importance of frequent visits to treat obesity as outlined per CMS and USPSTF guidelines and agreed to schedule her next follow up appointment today.  I, Jeralene PetersAshleigh Haynes, am acting as transcriptionist for Quillian Quincearen Ryver Poblete, MD  I have reviewed the above documentation for accuracy and completeness, and I agree with the above. -Quillian Quincearen Bartlett Enke, MD

## 2018-09-05 DIAGNOSIS — E559 Vitamin D deficiency, unspecified: Secondary | ICD-10-CM | POA: Insufficient documentation

## 2018-09-05 DIAGNOSIS — E669 Obesity, unspecified: Secondary | ICD-10-CM | POA: Insufficient documentation

## 2018-09-05 DIAGNOSIS — Z6830 Body mass index (BMI) 30.0-30.9, adult: Secondary | ICD-10-CM | POA: Insufficient documentation

## 2018-09-05 DIAGNOSIS — Z9189 Other specified personal risk factors, not elsewhere classified: Secondary | ICD-10-CM | POA: Insufficient documentation

## 2018-09-12 ENCOUNTER — Ambulatory Visit (INDEPENDENT_AMBULATORY_CARE_PROVIDER_SITE_OTHER): Payer: 59 | Admitting: Family Medicine

## 2018-09-12 ENCOUNTER — Other Ambulatory Visit: Payer: Self-pay

## 2018-09-12 ENCOUNTER — Encounter (INDEPENDENT_AMBULATORY_CARE_PROVIDER_SITE_OTHER): Payer: Self-pay | Admitting: Family Medicine

## 2018-09-12 VITALS — BP 100/66 | HR 71 | Temp 98.2°F | Ht 66.0 in | Wt 189.0 lb

## 2018-09-12 DIAGNOSIS — E559 Vitamin D deficiency, unspecified: Secondary | ICD-10-CM | POA: Diagnosis not present

## 2018-09-12 DIAGNOSIS — E669 Obesity, unspecified: Secondary | ICD-10-CM | POA: Diagnosis not present

## 2018-09-12 DIAGNOSIS — Z6830 Body mass index (BMI) 30.0-30.9, adult: Secondary | ICD-10-CM

## 2018-09-12 DIAGNOSIS — Z9189 Other specified personal risk factors, not elsewhere classified: Secondary | ICD-10-CM | POA: Diagnosis not present

## 2018-09-12 DIAGNOSIS — E7849 Other hyperlipidemia: Secondary | ICD-10-CM | POA: Diagnosis not present

## 2018-09-12 MED FILL — VIT D2 1.25 MG (50,000 UNIT: 1.25 MG | 28 days supply | Qty: 4 | Fill #0

## 2018-09-13 NOTE — Progress Notes (Signed)
Office: [phone]  /  Fax: [phone]   HPI:   Chief Complaint: OBESITY Christina [name] is here to discuss her progress with her obesity treatment plan. She is on the Category 2 plan and is following her eating plan approximately 90% of the time. She states she is walking 60 minutes 7 times per week. Christina [name] had a 2-day trip a couple of weeks ago to [place] with her 38-year-old. She had a graduation party for her daughter and Father's Day celebration the last 2 weekends.  Her weight is 189 lb (85.7 kg) today and has not lost weight since her last visit. She has lost 2 lbs since starting treatment with [name].  Vitamin D deficiency Christina [name] has a diagnosis of Vitamin D deficiency. She is currently taking prescription Vit D and denies nausea, vomiting, or muscle weakness but does report fatigue.  At risk for osteopenia and osteoporosis Christina [name] is at higher risk of osteopenia and osteoporosis due to Vitamin D deficiency.   Hyperlipidemia [name] has hyperlipidemia and is not on a statin. She has been trying to improve her cholesterol levels with intensive lifestyle modification including a low saturated fat diet, exercise and weight loss. Her last FLP was in early February.  ASSESSMENT AND PLAN:  No diagnosis found.  PLAN:  Vitamin D Deficiency Christina [name] was informed that low Vitamin D levels contributes to fatigue and are associated with obesity, breast, and colon cancer. She agrees to continue to take prescription Vit D @ 50,000 IU every week #4 with 0 refills and will follow-up for routine testing of Vitamin D, at least 2-3 times per year. She was informed of the risk of over-replacement of Vitamin D and agrees to not increase her dose unless she discusses this with [name] first. [name] agrees to follow-up with our clinic in 2 weeks.  At risk for osteopenia and osteoporosis Christina [name] was given extended  (15 minutes) osteoporosis prevention counseling today. [name] is at  risk for osteopenia and osteoporsis due to her Vitamin D deficiency. She was encouraged to take her Vitamin D and follow her higher calcium diet and increase strengthening exercise to help strengthen her bones and decrease her risk of osteopenia and osteoporosis.  Hyperlipidemia Christina [name] was informed of the American Heart Association Guidelines emphasizing intensive lifestyle modifications as the first line treatment for hyperlipidemia. We discussed many lifestyle modifications today in depth, and Christina [name] will continue to work on decreasing saturated fats such as fatty red meat, butter and many fried foods. She will also increase vegetables and lean protein in her diet and continue to work on exercise and weight loss efforts. Christina [name] will have repeat labs performed at her next appointment.  Obesity Christina [name] is currently in the action stage of change. As such, her goal is to continue with weight loss efforts. She has agreed to follow the Category 2 plan.  Christina [name] has been instructed to work up to a goal of 150 minutes of combined cardio and strengthening exercise per week for weight loss and overall health benefits. We discussed the following Behavioral Modification Strategies today: increasing lean protein intake, increasing vegetables, work on meal planning and easy cooking plans, keeping healthy foods in the home, better snacking choices, and planning for success.  Christina [name] has agreed to follow-up with our clinic in 2 weeks. She was informed of the importance of frequent follow-up visits to maximize her success with intensive lifestyle modifications for her multiple health conditions.  ALLERGIES: Allergies  Allergen Reactions  . Hydrocodone Itching  . Prednisone  Other (See Comments)    Hyper, agitated, aggressive    MEDICATIONS: Current Outpatient Medications on File Prior to Visit  Medication Sig Dispense Refill  . diclofenac (VOLTAREN) 50 MG EC tablet Take 1 tablet (50 mg total)  by mouth 2 (two) times daily. 180 tablet 1  . fluocinonide cream (LIDEX) 0.05 % Apply 1 application topically 2 (two) times daily. 60 g 2  . SUMAtriptan (IMITREX) 100 MG tablet Take 1 tablet (100 mg total) by mouth every 2 (two) hours as needed for migraine. May repeat in 2 hours if headache persists or recurs. 10 tablet 5  . Vitamin D, Ergocalciferol, (DRISDOL) 1.25 MG (50000 UT) CAPS capsule Take 1 capsule (50,000 Units total) by mouth every 7 (seven) days. 4 capsule 0   No current facility-administered medications on file prior to visit.     PAST MEDICAL HISTORY: Past Medical History:  Diagnosis Date  . Back pain   . Eczema   . Major depressive disorder   . Migraine   . Normal pregnancy 03/11/2012  . Ovarian cyst   . Postpartum hemorrhage   . SVD (spontaneous vaginal delivery) 03/11/2012    PAST SURGICAL HISTORY: History reviewed. No pertinent surgical history.  SOCIAL HISTORY: Social History   Tobacco Use  . Smoking status: Current Every Day Smoker    Packs/day: 0.50    Years: 20.00    Pack years: 10.00    Types: Cigarettes  . Smokeless tobacco: Never Used  Substance Use Topics  . Alcohol use: Yes    Alcohol/week: 0.0 standard drinks    Comment: occassionally for social functions  . Drug use: No    FAMILY HISTORY: Family History  Problem Relation Age of Onset  . Arthritis Father   . Sudden death Father [id number]       Sinus Cancer  . Cancer Father        nasal cancer  . Bipolar disorder Mother   . Depression Mother   . Anxiety disorder Mother   . Alcohol abuse Mother   . Drug abuse Mother   . Obesity Mother   . Sudden death Brother        GSW  . Arthritis Maternal Grandfather   . Hyperlipidemia Maternal Grandfather   . Heart disease Maternal Grandfather   . Breast cancer Maternal Grandmother   . Arthritis Maternal Grandmother   . Lung cancer Maternal Grandmother   . Heart disease Maternal Grandmother   . Hyperlipidemia Paternal Grandmother   . Stroke  Paternal Grandfather    ROS: Review of Systems  Constitutional: Positive for malaise/fatigue.  Gastrointestinal: Negative for nausea and vomiting.  Musculoskeletal:       Negative for muscle weakness.   PHYSICAL EXAM: Blood pressure 100/66, pulse 71, temperature 98.2 F (36.8 C), height 5\' 6"  (1.676 m), weight 189 lb (85.7 kg), last menstrual period 09/01/2018, SpO2 95 %. Body mass index is 30.51 kg/m. Physical Exam Vitals signs reviewed.  Constitutional:      Appearance: Normal appearance. She is obese.  Cardiovascular:     Rate and Rhythm: Normal rate.     Pulses: Normal pulses.  Pulmonary:     Effort: Pulmonary effort is normal.     Breath sounds: Normal breath sounds.  Musculoskeletal: Normal range of motion.  Skin:    General: Skin is warm and dry.  Neurological:     Mental Status: She is alert and oriented to person, place, and time.  Psychiatric:        Behavior:  Behavior normal.   RECENT LABS AND TESTS: BMET    Component Value Date/Time   NA 139 04/24/2018 1413   K 4.4 04/24/2018 1413   CL 102 04/24/2018 1413   CO2 25 04/24/2018 1413   GLUCOSE 92 04/24/2018 1413   GLUCOSE 76 10/31/2017 1659   BUN 11 04/24/2018 1413   CREATININE 0.73 04/24/2018 1413   CALCIUM 9.1 04/24/2018 1413   GFRNONAA 106 04/24/2018 1413   GFRAA 122 04/24/2018 1413   Lab Results  Component Value Date   HGBA1C 5.5 04/24/2018   HGBA1C 5.5 08/29/2014   Lab Results  Component Value Date   INSULIN 6.7 04/24/2018   CBC    Component Value Date/Time   WBC 9.2 04/24/2018 1413   WBC 8.7 10/31/2017 1659   RBC 4.63 04/24/2018 1413   RBC 4.55 10/31/2017 1659   HGB 14.0 04/24/2018 1413   HCT 42.0 04/24/2018 1413   PLT 244.0 10/31/2017 1659   MCV 91 04/24/2018 1413   MCH 30.2 04/24/2018 1413   MCH 30.0 01/03/2014 1558   MCHC 33.3 04/24/2018 1413   MCHC 34.1 10/31/2017 1659   RDW 12.9 04/24/2018 1413   LYMPHSABS 2.0 04/24/2018 1413   MONOABS 0.6 10/31/2017 1659   EOSABS 0.2  04/24/2018 1413   BASOSABS 0.0 04/24/2018 1413   Iron/TIBC/Ferritin/ %Sat No results found for: IRON, TIBC, FERRITIN, IRONPCTSAT Lipid Panel     Component Value Date/Time   CHOL 168 04/24/2018 1413   TRIG 54 04/24/2018 1413   HDL 54 04/24/2018 1413   CHOLHDL 3 10/31/2017 1418   VLDL 15.0 10/31/2017 1418   LDLCALC 103 (H) 04/24/2018 1413   Hepatic Function Panel     Component Value Date/Time   PROT 6.8 04/24/2018 1413   ALBUMIN 4.5 04/24/2018 1413   AST 14 04/24/2018 1413   ALT 12 04/24/2018 1413   ALKPHOS 55 04/24/2018 1413   BILITOT 0.4 04/24/2018 1413      Component Value Date/Time   TSH 1.030 04/24/2018 1413   TSH 1.32 10/31/2017 1659   TSH 1.27 10/12/2016 0830   Results for Verlan FriendsCAIRRIKIER DAVIDSON, Ambriel M (MRN [id number]) as of 09/13/2018 12:41  Ref. Range 04/24/2018 14:13  Vitamin D, 25-Hydroxy Latest Ref Range: 30.0 - 100.0 ng/mL 32.2    OBESITY BEHAVIORAL INTERVENTION VISIT  Today's visit was #10   Starting weight: 191 lbs Starting date: 04/24/2018 Today's weight: 189 lbs  Today's date: 09/12/2018 Total lbs lost to date: 2  ASK: We discussed the diagnosis of obesity with [name] M Cairrikier Ignacia Palmaavidson today and FairviewStefannie agreed to give us permission to discuss obesity behavioral modification therapy today.  ASSESS: [name] has the diagnosis of obesity and her BMI today is 30.9. Danial is in the action stage of change.   ADVISE: [name] was educated on the multiple health risks of obesity as well as the benefit of weight loss to improve her health. She was advised of the need for long term treatment and the importance of lifestyle modifications to improve her current health and to decrease her risk of future health problems.  AGREE: Multiple dietary modification options and treatment options were discussed and  Chauntay agreed to follow the recommendations documented in the above note.  ARRANGE: [name] was educated on the importance of  frequent visits to treat obesity as outlined per CMS and USPSTF guidelines and agreed to schedule her next follow up appointment today.  Fernanda DrumI, Denise Haag, am acting as transcriptionist for Debbra RidingAlexandria Kadolph, MD  I have reviewed the  above documentation for accuracy and completeness, and I agree with the above. - Debbra RidingAlexandria Kadolph, MD

## 2018-09-14 ENCOUNTER — Encounter: Payer: Self-pay | Admitting: Internal Medicine

## 2018-09-14 ENCOUNTER — Other Ambulatory Visit: Payer: Self-pay | Admitting: Internal Medicine

## 2018-09-14 DIAGNOSIS — G43001 Migraine without aura, not intractable, with status migrainosus: Secondary | ICD-10-CM

## 2018-09-14 DIAGNOSIS — L309 Dermatitis, unspecified: Secondary | ICD-10-CM

## 2018-09-14 MED ORDER — FLUOCINONIDE 0.05 % EX CREA: 1.0000 "application " | TOPICAL_CREAM | Freq: Two times a day (BID) | CUTANEOUS | 2 refills | Status: DC

## 2018-09-14 MED ORDER — VITAMIN D (ERGOCALCIFEROL) 1.25 MG (50000 UNIT) PO CAPS: 50000.0000 [IU] | ORAL_CAPSULE | ORAL | 0 refills | Status: DC

## 2018-09-14 MED ORDER — UBRELVY 50 MG PO TABS: 1.0000 | ORAL_TABLET | ORAL | 5 refills | Status: DC | PRN

## 2018-09-14 MED FILL — FLUOCINONIDE 0.05% CREAM: 0.05 | 30 days supply | Qty: 60 | Fill #0

## 2018-09-15 ENCOUNTER — Ambulatory Visit (INDEPENDENT_AMBULATORY_CARE_PROVIDER_SITE_OTHER): Payer: 59 | Admitting: Family Medicine

## 2018-09-15 ENCOUNTER — Encounter: Payer: Self-pay | Admitting: Family Medicine

## 2018-09-15 DIAGNOSIS — M5412 Radiculopathy, cervical region: Secondary | ICD-10-CM

## 2018-09-15 MED ORDER — GABAPENTIN 100 MG PO CAPS: 200.0000 mg | ORAL_CAPSULE | Freq: Every day | ORAL | 3 refills | Status: DC

## 2018-09-15 MED FILL — GABAPENTIN 100 MG CAPSULE: 100 | 30 days supply | Qty: 60 | Fill #0

## 2018-09-15 NOTE — Assessment & Plan Note (Signed)
Cervical radiculopathy.  I do think that this is acute.  To injections today to help with some of the inflammation.  We discussed oral anti-inflammatories scheduled for the next 6 days.  In addition to this gabapentin at night.  Home exercises given.  Follow-up again in 4 to 6 weeks

## 2018-09-15 NOTE — Progress Notes (Signed)
Tawana ScaleZach [name] D.O. [facility] [address] [place], [place] [address] Phone: [phone] Subjective:   Christina [name], Christina [name], am serving as a scribe for Dr. Antoine PrimasZachary [name].    CC: shoulder pain   UUV:[id number]:Subjective  Suvi M Cairrikier Christina [name] is a 38 y.o. female coming in with complaint of right shoulder pain. Patient was holding onto another patient. Arm was flexed as she was helping patient sit up. Pain over supraspinatus and into the right scapula. Lost sleep for a few days but is sleeping now. Feels numbness in the trap since injury especially when at her desk. Denies any radiating symptoms.  Patient states that there is some radiation to her hand which is minorly.  Has not noticed any weakness.     Past Medical History:  Diagnosis Date  . Back pain   . Eczema   . Major depressive disorder   . Migraine   . Normal pregnancy 03/11/2012  . Ovarian cyst   . Postpartum hemorrhage   . SVD (spontaneous vaginal delivery) 03/11/2012   No past surgical history on file. Social History   Socioeconomic History  . Marital status: Married    Spouse name: [name]  . Number of children: 2  . Years of education: Not on file  . Highest education level: Not on file  Occupational History  . Occupation: cma     Employer: [employer]  Social Needs  . Financial resource strain: Not on file  . Food insecurity    Worry: Not on file    Inability: Not on file  . Transportation needs    Medical: Not on file    Non-medical: Not on file  Tobacco Use  . Smoking status: Current Every Day Smoker    Packs/day: 0.50    Years: 20.00    Pack years: 10.00    Types: Cigarettes  . Smokeless tobacco: Never Used  Substance and Sexual Activity  . Alcohol use: Yes    Alcohol/week: 0.0 standard drinks    Comment: occassionally for social functions  . Drug use: No  . Sexual activity: Yes    Partners: Male  Lifestyle  . Physical activity    Days per week: Not on file    Minutes per session:  Not on file  . Stress: Not on file  Relationships  . Social Musicianconnections    Talks on phone: Not on file    Gets together: Not on file    Attends religious service: Not on file    Active member of club or organization: Not on file    Attends meetings of clubs or organizations: Not on file    Relationship status: Not on file  Other Topics Concern  . Not on file  Social History Narrative  . Not on file   Allergies  Allergen Reactions  . Hydrocodone Itching  . Prednisone Other (See Comments)    Hyper, agitated, aggressive   Family History  Problem Relation Age of Onset  . Arthritis Father   . Sudden death Father [id number]       Sinus Cancer  . Cancer Father        nasal cancer  . Bipolar disorder Mother   . Depression Mother   . Anxiety disorder Mother   . Alcohol abuse Mother   . Drug abuse Mother   . Obesity Mother   . Sudden death Brother        GSW  . Arthritis Maternal Grandfather   . Hyperlipidemia Maternal Grandfather   .  Heart disease Maternal Grandfather   . Breast cancer Maternal Grandmother   . Arthritis Maternal Grandmother   . Lung cancer Maternal Grandmother   . Heart disease Maternal Grandmother   . Hyperlipidemia Paternal Grandmother   . Stroke Paternal Grandfather        Current Outpatient Medications (Analgesics):  .  diclofenac (VOLTAREN) 50 MG EC tablet, Take 1 tablet (50 mg total) by mouth 2 (two) times daily. Marland Kitchen  Ubrogepant (UBRELVY) 50 MG TABS, Take 1 tablet by mouth as needed.   Current Outpatient Medications (Other):  .  fluocinonide cream (LIDEX) 1.82 %, Apply 1 application topically 2 (two) times daily. .  Vitamin D, Ergocalciferol, (DRISDOL) 1.25 MG (50000 UT) CAPS capsule, Take 1 capsule (50,000 Units total) by mouth every 7 (seven) days.    Past medical history, social, surgical and family history all reviewed in electronic medical record.  No pertanent information unless stated regarding to the chief complaint.   Review of Systems:  No  headache, visual changes, nausea, vomiting, diarrhea, constipation, dizziness, abdominal pain, skin rash, fevers, chills, night sweats, weight loss, swollen lymph nodes, body aches, joint swelling,  chest pain, shortness of breath, mood changes.  Positive muscle aches   General: No apparent distress alert and oriented x3 mood and affect normal, dressed appropriately.  HEENT: Pupils equal, extraocular movements intact  Respiratory: Patient's speak in full sentences and does not appear short of breath  Cardiovascular: No lower extremity edema, non tender, no erythema  Skin: Warm dry intact with no signs of infection or rash on extremities or on axial skeleton.  Abdomen: Soft nontender  Neuro: Cranial nerves II through XII are intact, neurovascularly intact in all extremities with 2+ DTRs and 2+ pulses.  Lymph: No lymphadenopathy of posterior or anterior cervical chain or axillae bilaterally.  Gait normal with good balance and coordination.  MSK:  Non tender with full range of motion and good stability and symmetric strength and tone of shoulders, elbows, wrist, hip, knee and ankles bilaterally.  Neck exam shows the patient does have mild loss of lordosis.  Worsening pain with extension.  Also positive Spurling's with radicular symptoms in the C6 distribution.  Patient does have weakness with the C6 distribution as well.  4-5 strength.  Brachial radialis reflexes 1+ compared to the contralateral side.   Impression and Recommendations:     This case required medical decision making of moderate complexity. The above documentation has been reviewed and is accurate and complete Lyndal Pulley, DO       Note: This dictation was prepared with Dragon dictation along with smaller phrase technology. Any transcriptional errors that result from this process are unintentional.

## 2018-09-15 NOTE — Patient Instructions (Addendum)
Good to see you  Ice 20 minutes 2 times daily. Usually after activity and before bed. Gabapentin 200mg  at night Duixes 3 times a day for 6 days  Flexeril for breakthrough  Exercises 3 times a week.

## 2018-09-28 ENCOUNTER — Telehealth: Payer: Self-pay | Admitting: Internal Medicine

## 2018-09-28 NOTE — Telephone Encounter (Signed)
PA has been completed and approved 

## 2018-09-28 NOTE — Telephone Encounter (Signed)
Medication needs a Pa   Ubrogepant (UBRELVY) 50 MG TABS [[id number]]   Key: [id number]

## 2018-10-01 NOTE — Progress Notes (Signed)
[name] [address] [place], [place] [address] Phone: [phone] Subjective:    CC: Neck pain follow-up  [id number]  Christina [name] [name] is a 38 y.o. female coming in with complaint of neck pain. Patient continues to have weakness and tingling in the right UE. Over the weekend patient did move cinder blocks and had an increase in her symptoms throughout the next 24 hours following work. Symptoms have not completely diminished but have improved. Is using gabapentin at night.     Past Medical History:  Diagnosis Date  . Back pain   . Eczema   . Major depressive disorder   . Migraine   . Normal pregnancy 03/11/2012  . Ovarian cyst   . Postpartum hemorrhage   . SVD (spontaneous vaginal delivery) 03/11/2012   No past surgical history on file. Social History   Socioeconomic History  . Marital status: Married    Spouse name: Lysbeth Galas  . Number of children: 2  . Years of education: Not on file  . Highest education level: Not on file  Occupational History  . Occupation: cma     Employer: [employer]  Social Needs  . Financial resource strain: Not on file  . Food insecurity    Worry: Not on file    Inability: Not on file  . Transportation needs    Medical: Not on file    Non-medical: Not on file  Tobacco Use  . Smoking status: Current Every Day Smoker    Packs/day: 0.50    Years: 20.00    Pack years: 10.00    Types: Cigarettes  . Smokeless tobacco: Never Used  Substance and Sexual Activity  . Alcohol use: Yes    Alcohol/week: 0.0 standard drinks    Comment: occassionally for social functions  . Drug use: No  . Sexual activity: Yes    Partners: Male  Lifestyle  . Physical activity    Days per week: Not on file    Minutes per session: Not on file  . Stress: Not on file  Relationships  . Social Herbalist on phone: Not on file    Gets together: Not on file    Attends religious service: Not on file   Active member of club or organization: Not on file    Attends meetings of clubs or organizations: Not on file    Relationship status: Not on file  Other Topics Concern  . Not on file  Social History Narrative  . Not on file   Allergies  Allergen Reactions  . Hydrocodone Itching  . Prednisone Other (See Comments)    Hyper, agitated, aggressive   Family History  Problem Relation Age of Onset  . Arthritis Father   . Sudden death Father 75       Sinus Cancer  . Cancer Father        nasal cancer  . Bipolar disorder Mother   . Depression Mother   . Anxiety disorder Mother   . Alcohol abuse Mother   . Drug abuse Mother   . Obesity Mother   . Sudden death Brother        GSW  . Arthritis Maternal Grandfather   . Hyperlipidemia Maternal Grandfather   . Heart disease Maternal Grandfather   . Breast cancer Maternal Grandmother   . Arthritis Maternal Grandmother   . Lung cancer Maternal Grandmother   . Heart disease Maternal Grandmother   . Hyperlipidemia Paternal Grandmother   .  Stroke Paternal Grandfather        Current Outpatient Medications (Analgesics):  .  diclofenac (VOLTAREN) 50 MG EC tablet, Take 1 tablet (50 mg total) by mouth 2 (two) times daily. Marland Kitchen.  Ubrogepant (UBRELVY) 50 MG TABS, Take 1 tablet by mouth as needed.   Current Outpatient Medications (Other):  .  fluocinonide cream (LIDEX) 0.05 %, Apply 1 application topically 2 (two) times daily. Marland Kitchen.  gabapentin (NEURONTIN) 100 MG capsule, Take 2 capsules (200 mg total) by mouth at bedtime. .  Vitamin D, Ergocalciferol, (DRISDOL) 1.25 MG (50000 UT) CAPS capsule, Take 1 capsule (50,000 Units total) by mouth every 7 (seven) days.    Past medical history, social, surgical and family history all reviewed in electronic medical record.  No pertanent information unless stated regarding to the chief complaint.   Review of Systems:  No headache, visual changes, nausea, vomiting, diarrhea, constipation, dizziness, abdominal  pain, skin rash, fevers, chills, night sweats, weight loss, swollen lymph nodes, body aches, joint swelling,  chest pain, shortness of breath, mood changes.  Positive muscle aches  Objective  Blood pressure 108/66, pulse 71, height 5\' 6"  (1.676 m), weight 189 lb (85.7 kg), SpO2 99 %.   General: No apparent distress alert and oriented x3 mood and affect normal, dressed appropriately.  HEENT: Pupils equal, extraocular movements intact  Respiratory: Patient's speak in full sentences and does not appear short of breath  Cardiovascular: No lower extremity edema, non tender, no erythema  Skin: Warm dry intact with no signs of infection or rash on extremities or on axial skeleton.  Abdomen: Soft nontender  Neuro: Cranial nerves II through XII are intact, neurovascularly intact in all extremities with 2+ DTRs and 2+ pulses.  Lymph: No lymphadenopathy of posterior or anterior cervical chain or axillae bilaterally.  Gait normal with good balance and coordination.  MSK:  Non tender with full range of motion and good stability and symmetric strength and tone of shoulders, elbows, wrist, hip, knee and ankles bilaterally.  Neck: Inspection mild loss of lordosis. No palpable stepoffs. Mild positive Spurling's maneuver. Full neck range of motion Grip strength and sensation normal in bilateral hands Strength good C4 to T1 distribution No sensory change to C4 to T1 Negative Hoffman sign bilaterally Reflexes normal So the trapezius bilaterally right greater than left  Osteopathic findings  C6 flexed rotated and side bent left T3 extended rotated and side bent right inhaled third rib T9 extended rotated and side bent left L4 flexed rotated and side bent left  Sacrum right on right    Impression and Recommendations:     This case required medical decision making of moderate complexity. The above documentation has been reviewed and is accurate and complete Judi SaaZachary M Walburga Hudman, DO       Note: This  dictation was prepared with Dragon dictation along with smaller phrase technology. Any transcriptional errors that result from this process are unintentional.

## 2018-10-02 ENCOUNTER — Other Ambulatory Visit: Payer: Self-pay

## 2018-10-02 ENCOUNTER — Ambulatory Visit (INDEPENDENT_AMBULATORY_CARE_PROVIDER_SITE_OTHER): Payer: 59 | Admitting: Family Medicine

## 2018-10-02 ENCOUNTER — Encounter: Payer: Self-pay | Admitting: Family Medicine

## 2018-10-02 VITALS — BP 108/66 | HR 71 | Ht 66.0 in | Wt 189.0 lb

## 2018-10-02 DIAGNOSIS — M5412 Radiculopathy, cervical region: Secondary | ICD-10-CM | POA: Diagnosis not present

## 2018-10-02 DIAGNOSIS — M999 Biomechanical lesion, unspecified: Secondary | ICD-10-CM

## 2018-10-02 NOTE — Assessment & Plan Note (Signed)
Patient no longer has any significant weakness.  Discussed with patient with icing regimen and home exercises.  We discussed with patient to continue with conservative therapy at this time.  Patient will increase activity as tolerated.  Follow-up with me again in 4 to 8 weeks

## 2018-10-02 NOTE — Assessment & Plan Note (Signed)
Decision today to treat with OMT was based on Physical Exam  After verbal consent patient was treated with HVLA, ME, FPR techniques in cervical, thoracic, rib lumbar and sacral areas  Patient tolerated the procedure well with improvement in symptoms  Patient given exercises, stretches and lifestyle modifications  See medications in patient instructions if given  Patient will follow up in 4 weeks 

## 2018-10-13 ENCOUNTER — Encounter: Payer: Self-pay | Admitting: Family Medicine

## 2018-10-16 MED ORDER — GABAPENTIN 100 MG PO CAPS: 200.0000 mg | ORAL_CAPSULE | Freq: Every day | ORAL | 1 refills | Status: DC

## 2018-10-17 MED ORDER — GABAPENTIN 300 MG PO CAPS: 300.0000 mg | ORAL_CAPSULE | Freq: Every day | ORAL | 1 refills | Status: DC

## 2018-10-17 MED FILL — GABAPENTIN 300 MG CAPSULE: 300 | 90 days supply | Qty: 90 | Fill #0

## 2018-10-17 NOTE — Addendum Note (Signed)
Addended by: Douglass Rivers T on: 10/17/2018 10:18 AM   Modules accepted: Orders

## 2018-10-20 ENCOUNTER — Telehealth: Payer: Self-pay

## 2018-10-20 ENCOUNTER — Other Ambulatory Visit: Payer: Self-pay | Admitting: Family

## 2018-10-20 MED ORDER — FLUCONAZOLE 150 MG PO TABS: 150.0000 mg | ORAL_TABLET | Freq: Once | ORAL | 1 refills | Status: AC

## 2018-10-20 MED FILL — FLUCONAZOLE 150 MG TABS: 150 | 3 days supply | Qty: 2 | Fill #0

## 2018-10-23 ENCOUNTER — Ambulatory Visit (INDEPENDENT_AMBULATORY_CARE_PROVIDER_SITE_OTHER): Payer: 59 | Admitting: Family Medicine

## 2018-10-24 NOTE — Telephone Encounter (Signed)
Script called in for patient as requested.

## 2018-11-02 ENCOUNTER — Encounter: Payer: Self-pay | Admitting: Internal Medicine

## 2018-11-02 ENCOUNTER — Ambulatory Visit (INDEPENDENT_AMBULATORY_CARE_PROVIDER_SITE_OTHER): Payer: 59 | Admitting: Internal Medicine

## 2018-11-02 ENCOUNTER — Other Ambulatory Visit (INDEPENDENT_AMBULATORY_CARE_PROVIDER_SITE_OTHER): Payer: 59

## 2018-11-02 ENCOUNTER — Other Ambulatory Visit: Payer: Self-pay

## 2018-11-02 VITALS — BP 120/74 | HR 66 | Temp 98.2°F | Resp 16 | Ht 66.0 in | Wt 192.0 lb

## 2018-11-02 DIAGNOSIS — E559 Vitamin D deficiency, unspecified: Secondary | ICD-10-CM

## 2018-11-02 DIAGNOSIS — Z0000 Encounter for general adult medical examination without abnormal findings: Secondary | ICD-10-CM | POA: Diagnosis not present

## 2018-11-02 NOTE — Progress Notes (Signed)
Subjective:  Patient ID: Christina [name], female    DOB: [birth year]  Age: 38 y.o. MRN: [id number]  CC: Annual Exam   HPI Christina [name] presents for a CPX.  She feels well and offers no complaints.  She is very active and denies any recent episodes of CP or DOE.  She has a history of vitamin D deficiency but tells me she is not currently taking a vitamin D supplement.  Outpatient Medications Prior to Visit  Medication Sig Dispense Refill  . diclofenac (VOLTAREN) 50 MG EC tablet Take 1 tablet (50 mg total) by mouth 2 (two) times daily. 180 tablet 1  . fluocinonide cream (LIDEX) 0.53 % Apply 1 application topically 2 (two) times daily. 60 g 2  . gabapentin (NEURONTIN) 300 MG capsule Take 1 capsule (300 mg total) by mouth at bedtime. 90 capsule 1  . Ubrogepant (UBRELVY) 50 MG TABS Take 1 tablet by mouth as needed. 10 tablet 5  . gabapentin (NEURONTIN) 100 MG capsule Take 2 capsules (200 mg total) by mouth at bedtime. 180 capsule 1  . Vitamin D, Ergocalciferol, (DRISDOL) 1.25 MG (50000 UT) CAPS capsule Take 1 capsule (50,000 Units total) by mouth every 7 (seven) days. 4 capsule 0   No facility-administered medications prior to visit.     ROS Review of Systems  Constitutional: Negative for appetite change, diaphoresis, fatigue and unexpected weight change.  HENT: Negative.   Eyes: Negative for visual disturbance.  Respiratory: Negative for cough, chest tightness, shortness of breath and wheezing.   Cardiovascular: Negative for chest pain.  Gastrointestinal: Negative for abdominal pain, diarrhea, nausea and vomiting.  Endocrine: Negative.   Genitourinary: Negative.  Negative for difficulty urinating.  Musculoskeletal: Negative.   Skin: Negative.   Neurological: Negative.   Hematological: Negative.  Negative for adenopathy.  Psychiatric/Behavioral: Negative.   All other systems reviewed and are negative.   Objective:  BP 120/74 (BP Location: Left  Arm, Patient Position: Sitting, Cuff Size: Normal)   Pulse 66   Temp 98.2 F (36.8 C) (Oral)   Resp 16   Ht 5\' 6"  (1.676 m)   Wt 192 lb (87.1 kg)   SpO2 96%   BMI 30.99 kg/m   BP Readings from Last 3 Encounters:  11/02/18 120/74  10/02/18 108/66  09/15/18 98/64    Wt Readings from Last 3 Encounters:  11/02/18 192 lb (87.1 kg)  10/02/18 189 lb (85.7 kg)  09/12/18 189 lb (85.7 kg)    Physical Exam Vitals signs reviewed.  Constitutional:      Appearance: She is not ill-appearing or diaphoretic.  HENT:     Nose: Nose normal. No congestion or rhinorrhea.  Eyes:     General: No scleral icterus.    Conjunctiva/sclera: Conjunctivae normal.  Neck:     Musculoskeletal: Normal range of motion. No neck rigidity or muscular tenderness.  Cardiovascular:     Rate and Rhythm: Normal rate and regular rhythm.     Heart sounds: No murmur.  Pulmonary:     Effort: Pulmonary effort is normal. No respiratory distress.     Breath sounds: No stridor. No wheezing, rhonchi or rales.  Abdominal:     General: Abdomen is flat.     Palpations: There is no hepatomegaly or splenomegaly.     Tenderness: There is no abdominal tenderness.  Musculoskeletal: Normal range of motion.     Right lower leg: No edema.     Left lower leg: No edema.  Lymphadenopathy:  Cervical: No cervical adenopathy.  Skin:    General: Skin is warm and dry.     Coloration: Skin is not pale.  Neurological:     General: No focal deficit present.     Mental Status: She is alert and oriented to person, place, and time. Mental status is at baseline.  Psychiatric:        Mood and Affect: Mood normal.        Behavior: Behavior normal.        Thought Content: Thought content normal.        Judgment: Judgment normal.     Lab Results  Component Value Date   WBC 9.2 04/24/2018   HGB 14.0 04/24/2018   HCT 42.0 04/24/2018   PLT 244.0 10/31/2017   GLUCOSE 92 04/24/2018   CHOL 168 04/24/2018   TRIG 54 04/24/2018   HDL  54 04/24/2018   LDLCALC 103 (H) 04/24/2018   ALT 12 04/24/2018   AST 14 04/24/2018   NA 139 04/24/2018   K 4.4 04/24/2018   CL 102 04/24/2018   CREATININE 0.73 04/24/2018   BUN 11 04/24/2018   CO2 25 04/24/2018   TSH 1.030 04/24/2018   HGBA1C 5.5 04/24/2018    Dg Elbow Complete Right  Result Date: 07/19/2018 CLINICAL DATA:  Right elbow pain for 7 days.  No known injury. EXAM: RIGHT ELBOW - COMPLETE 3+ VIEW COMPARISON:  None. FINDINGS: There is no evidence of fracture, dislocation, or joint effusion. There is no evidence of arthropathy or other focal bone abnormality. Soft tissues are unremarkable. IMPRESSION: Negative. Electronically Signed   By: Francene BoyersJames  Maxwell M.D.   On: 07/19/2018 11:24    Assessment & Plan:   Christina [name] was seen today for annual exam.  Diagnoses and all orders for this visit:  Vitamin D deficiency- Her vitamin D level is in the normal range now. -     VITAMIN D 25 Hydroxy (Vit-D Deficiency, Fractures); Future  Routine general medical examination at a health care facility- Exam completed, labs done earlier this year reviewed, vaccines reviewed, she has a Pap smear scheduled with her GYN in 1 month, patient education was given.   I have discontinued Manjot M. Cairrikier Davidson's Vitamin D (Ergocalciferol). I am also having her maintain her diclofenac, Ubrelvy, fluocinonide cream, and gabapentin.  No orders of the defined types were placed in this encounter.    Follow-up: Return if symptoms worsen or fail to improve.  Sanda Lingerhomas Karee Forge, MD

## 2018-11-02 NOTE — Patient Instructions (Signed)
Health Maintenance, Female Adopting a healthy lifestyle and getting preventive care are important in promoting health and wellness. Ask your health care provider about:  The right schedule for you to have regular tests and exams.  Things you can do on your own to prevent diseases and keep yourself healthy. What should I know about diet, weight, and exercise? Eat a healthy diet   Eat a diet that includes plenty of vegetables, fruits, low-fat dairy products, and lean protein.  Do not eat a lot of foods that are high in solid fats, added sugars, or sodium. Maintain a healthy weight Body mass index (BMI) is used to identify weight problems. It estimates body fat based on height and weight. Your health care provider can help determine your BMI and help you achieve or maintain a healthy weight. Get regular exercise Get regular exercise. This is one of the most important things you can do for your health. Most adults should:  Exercise for at least 150 minutes each week. The exercise should increase your heart rate and make you sweat (moderate-intensity exercise).  Do strengthening exercises at least twice a week. This is in addition to the moderate-intensity exercise.  Spend less time sitting. Even light physical activity can be beneficial. Watch cholesterol and blood lipids Have your blood tested for lipids and cholesterol at 38 years of age, then have this test every 5 years. Have your cholesterol levels checked more often if:  Your lipid or cholesterol levels are high.  You are older than 38 years of age.  You are at high risk for heart disease. What should I know about cancer screening? Depending on your health history and family history, you may need to have cancer screening at various ages. This may include screening for:  Breast cancer.  Cervical cancer.  Colorectal cancer.  Skin cancer.  Lung cancer. What should I know about heart disease, diabetes, and high blood  pressure? Blood pressure and heart disease  High blood pressure causes heart disease and increases the risk of stroke. This is more likely to develop in people who have high blood pressure readings, are of African descent, or are overweight.  Have your blood pressure checked: ? Every 3-5 years if you are 18-39 years of age. ? Every year if you are 40 years old or older. Diabetes Have regular diabetes screenings. This checks your fasting blood sugar level. Have the screening done:  Once every three years after age 40 if you are at a normal weight and have a low risk for diabetes.  More often and at a younger age if you are overweight or have a high risk for diabetes. What should I know about preventing infection? Hepatitis B If you have a higher risk for hepatitis B, you should be screened for this virus. Talk with your health care provider to find out if you are at risk for hepatitis B infection. Hepatitis C Testing is recommended for:  Everyone born from 1945 through 1965.  Anyone with known risk factors for hepatitis C. Sexually transmitted infections (STIs)  Get screened for STIs, including gonorrhea and chlamydia, if: ? You are sexually active and are younger than 38 years of age. ? You are older than 38 years of age and your health care provider tells you that you are at risk for this type of infection. ? Your sexual activity has changed since you were last screened, and you are at increased risk for chlamydia or gonorrhea. Ask your health care provider if   you are at risk.  Ask your health care provider about whether you are at high risk for HIV. Your health care provider may recommend a prescription medicine to help prevent HIV infection. If you choose to take medicine to prevent HIV, you should first get tested for HIV. You should then be tested every 3 months for as long as you are taking the medicine. Pregnancy  If you are about to stop having your period (premenopausal) and  you may become pregnant, seek counseling before you get pregnant.  Take 400 to 800 micrograms (mcg) of folic acid every day if you become pregnant.  Ask for birth control (contraception) if you want to prevent pregnancy. Osteoporosis and menopause Osteoporosis is a disease in which the bones lose minerals and strength with aging. This can result in bone fractures. If you are 65 years old or older, or if you are at risk for osteoporosis and fractures, ask your health care provider if you should:  Be screened for bone loss.  Take a calcium or vitamin D supplement to lower your risk of fractures.  Be given hormone replacement therapy (HRT) to treat symptoms of menopause. Follow these instructions at home: Lifestyle  Do not use any products that contain nicotine or tobacco, such as cigarettes, e-cigarettes, and chewing tobacco. If you need help quitting, ask your health care provider.  Do not use street drugs.  Do not share needles.  Ask your health care provider for help if you need support or information about quitting drugs. Alcohol use  Do not drink alcohol if: ? Your health care provider tells you not to drink. ? You are pregnant, may be pregnant, or are planning to become pregnant.  If you drink alcohol: ? Limit how much you use to 0-1 drink a day. ? Limit intake if you are breastfeeding.  Be aware of how much alcohol is in your drink. In the [place] one drink equals one 12 oz bottle of beer (355 mL), one 5 oz glass of wine (148 mL), or one 1 oz glass of hard liquor (44 mL). General instructions  Schedule regular health, dental, and eye exams.  Stay current with your vaccines.  Tell your health care provider if: ? You often feel depressed. ? You have ever been abused or do not feel safe at home. Summary  Adopting a healthy lifestyle and getting preventive care are important in promoting health and wellness.  Follow your health care provider's instructions about healthy  diet, exercising, and getting tested or screened for diseases.  Follow your health care provider's instructions on monitoring your cholesterol and blood pressure. This information is not intended to replace advice given to you by your health care provider. Make sure you discuss any questions you have with your health care provider. Document Released: 09/21/2010 Document Revised: 03/01/2018 Document Reviewed: 03/01/2018 Elsevier Patient Education  2020 [employer] Inc[employer]  

## 2018-11-03 ENCOUNTER — Encounter: Payer: Self-pay | Admitting: Internal Medicine

## 2018-11-03 LAB — VITAMIN D 25 HYDROXY (VIT D DEFICIENCY, FRACTURES): VITD: 57.86 ng/mL (ref 30.00–100.00)

## 2018-11-15 LAB — NOVEL CORONAVIRUS, NAA: SARS-CoV-2, NAA: NEGATIVE

## 2018-11-17 ENCOUNTER — Encounter: Payer: Self-pay | Admitting: Internal Medicine

## 2018-11-17 NOTE — Progress Notes (Signed)
Abstracted and sent to scan  

## 2018-11-19 ENCOUNTER — Other Ambulatory Visit: Payer: Self-pay

## 2018-11-19 ENCOUNTER — Ambulatory Visit (HOSPITAL_COMMUNITY)
Admission: EM | Admit: 2018-11-19 | Discharge: 2018-11-19 | Disposition: A | Discharge status: Home / Self Care | Payer: 59 | Attending: Family Medicine | Admitting: Family Medicine

## 2018-11-19 ENCOUNTER — Encounter (HOSPITAL_COMMUNITY): Payer: Self-pay | Admitting: Emergency Medicine

## 2018-11-19 DIAGNOSIS — S0590XA Unspecified injury of unspecified eye and orbit, initial encounter: Secondary | ICD-10-CM

## 2018-11-19 DIAGNOSIS — S0502XA Injury of conjunctiva and corneal abrasion without foreign body, left eye, initial encounter: Secondary | ICD-10-CM

## 2018-11-19 DIAGNOSIS — H1132 Conjunctival hemorrhage, left eye: Secondary | ICD-10-CM

## 2018-11-19 MED ORDER — FLUORESCEIN SODIUM 1 MG OP STRP
ORAL_STRIP | OPHTHALMIC | Status: AC
  Filled 2018-11-19: qty 1

## 2018-11-19 MED ORDER — ERYTHROMYCIN 5 MG/GM OP OINT: 1.0000 "application " | TOPICAL_OINTMENT | Freq: Four times a day (QID) | OPHTHALMIC | 0 refills | Status: DC

## 2018-11-19 MED ORDER — TETRACAINE HCL 0.5 % OP SOLN
OPHTHALMIC | Status: AC
  Filled 2018-11-19: qty 4

## 2018-11-19 NOTE — ED Triage Notes (Signed)
Pt here for left eye injury after something flew up and hit her left eye; redness noted to sclera

## 2018-11-19 NOTE — Discharge Instructions (Signed)
Please use cool compresses to try to take down the amount of swelling and bleeding Use the erythromycin eye ointment several times a day.  Put a good amount and at bedtime Call the eye doctor first thing in the morning to get an appointment

## 2018-11-19 NOTE — ED Provider Notes (Addendum)
[facility]    CSN: [id number] Arrival date & time: 11/19/18  1624      History   Chief Complaint Chief Complaint  Patient presents with  . Eye Injury    HPI Christina [name] is a 38 y.o. female.   HPI   Patient was weed eating her yard.  She was not wearing eye protection.  Something flew into her eye.  She is here for eye pain and redness.  Her vision is intact.  Past Medical History:  Diagnosis Date  . Back pain   . Eczema   . Major depressive disorder   . Migraine   . Normal pregnancy 03/11/2012  . Ovarian cyst   . Postpartum hemorrhage   . SVD (spontaneous vaginal delivery) 03/11/2012    Patient Active Problem List   Diagnosis Date Noted  . Cervical radiculopathy at C6 09/15/2018  . Vitamin D deficiency 09/05/2018  . Class 1 obesity with serious comorbidity and body mass index (BMI) of 30.0 to 30.9 in adult 09/05/2018  . Olecranon bursitis of right elbow 07/19/2018  . Allergic rhinosinusitis 01/30/2018  . Female stress incontinence 11/22/2017  . DDD (degenerative disc disease), lumbar 10/31/2017  . Varicose veins of right lower extremity with complications 85/46/2703  . Eczema 08/17/2017  . Middle insomnia 04/20/2016  . SI (sacroiliac) joint dysfunction 12/04/2015  . Depression with somatization 11/22/2014  . Routine general medical examination at a health care facility 08/29/2014  . Obesity (BMI 30.0-34.9) 08/28/2014  . Tobacco use disorder 02/23/2014  . Migraines 02/23/2014    History reviewed. No pertinent surgical history.  OB History    Gravida  4   Para  2   Term  1   Preterm      AB  2   Living  2     SAB  2   TAB  0   Ectopic      Multiple      Live Births  1            Home Medications    Prior to Admission medications   Medication Sig Start Date End Date Taking? Authorizing Provider  diclofenac (VOLTAREN) 50 MG EC tablet Take 1 tablet (50 mg total) by mouth 2 (two) times daily. 05/22/18    Janith Lima, MD  erythromycin ophthalmic ointment Place 1 application into the left eye 4 (four) times daily. Place a 1/4 inch ribbon of ointment into the lower eyelid. 11/19/18   Raylene Everts, MD  fluocinonide cream (LIDEX) 5.00 % Apply 1 application topically 2 (two) times daily. 09/14/18   Janith Lima, MD  gabapentin (NEURONTIN) 300 MG capsule Take 1 capsule (300 mg total) by mouth at bedtime. 10/17/18   Lyndal Pulley, DO  Ubrogepant (UBRELVY) 50 MG TABS Take 1 tablet by mouth as needed. 09/14/18   Janith Lima, MD    Family History Family History  Problem Relation Age of Onset  . Arthritis Father   . Sudden death Father 41       Sinus Cancer  . Cancer Father        nasal cancer  . Bipolar disorder Mother   . Depression Mother   . Anxiety disorder Mother   . Alcohol abuse Mother   . Drug abuse Mother   . Obesity Mother   . Sudden death Brother        GSW  . Arthritis Maternal Grandfather   . Hyperlipidemia Maternal Grandfather   .  Heart disease Maternal Grandfather   . Breast cancer Maternal Grandmother   . Arthritis Maternal Grandmother   . Lung cancer Maternal Grandmother   . Heart disease Maternal Grandmother   . Hyperlipidemia Paternal Grandmother   . Stroke Paternal Grandfather     Social History Social History   Tobacco Use  . Smoking status: Current Every Day Smoker    Packs/day: 0.50    Years: 20.00    Pack years: 10.00    Types: Cigarettes  . Smokeless tobacco: Never Used  Substance Use Topics  . Alcohol use: Yes    Alcohol/week: 0.0 standard drinks    Comment: occassionally for social functions  . Drug use: No     Allergies   Hydrocodone and Prednisone   Review of Systems Review of Systems  Constitutional: Negative for chills and fever.  HENT: Negative for ear pain and sore throat.   Eyes: Positive for redness. Negative for pain and visual disturbance.  Respiratory: Negative for cough and shortness of breath.   Cardiovascular:  Negative for chest pain and palpitations.  Gastrointestinal: Negative for abdominal pain and vomiting.  Genitourinary: Negative for dysuria and hematuria.  Musculoskeletal: Negative for arthralgias and back pain.  Skin: Negative for color change and rash.  Neurological: Negative for seizures and syncope.  All other systems reviewed and are negative.    Physical Exam Triage Vital Signs ED Triage Vitals [11/19/18 1653]  Enc Vitals Group     BP 109/79     Pulse Rate 92     Resp 18     Temp 98.5 F (36.9 C)     Temp Source Oral     SpO2 97 %     Weight      Height      Head Circumference      Peak Flow      Pain Score 2     Pain Loc      Pain Edu?      Excl. in GC?    No data found.  Updated Vital Signs BP 109/79 (BP Location: Right Arm)   Pulse 92   Temp 98.5 F (36.9 C) (Oral)   Resp 18   SpO2 97%   Visual Acuity Right Eye Distance: 20/50 Left Eye Distance: 20/40 Bilateral Distance: 20/40      Physical Exam Constitutional:      General: She is not in acute distress.    Appearance: She is well-developed.  HENT:     Head: Normocephalic and atraumatic.  Eyes:     Conjunctiva/sclera: Conjunctivae normal.     Pupils: Pupils are equal, round, and reactive to light.   Neck:     Musculoskeletal: Normal range of motion.  Cardiovascular:     Rate and Rhythm: Normal rate.  Pulmonary:     Effort: Pulmonary effort is normal. No respiratory distress.  Abdominal:     General: There is no distension.     Palpations: Abdomen is soft.  Musculoskeletal: Normal range of motion.  Skin:    General: Skin is warm and dry.  Neurological:     Mental Status: She is alert.        UC Treatments / Results  Labs (all labs ordered are listed, but only abnormal results are displayed) Labs Reviewed - No data to display  EKG   Radiology No results found.  Procedures Procedures (including critical care time)  Medications Ordered in UC Medications  tetracaine  (PONTOCAINE) 0.5 % ophthalmic solution (has no administration  in time range)  fluorescein 1 MG ophthalmic strip (has no administration in time range)    Initial Impression / Assessment and Plan / UC Course  I have reviewed the triage vital signs and the nursing notes.  Pertinent labs & imaging results that were available during my care of the patient were reviewed by me and considered in my medical decision making (see chart for details).      Final Clinical Impressions(s) / UC Diagnoses   Final diagnoses:  Eye injury, initial encounter  Abrasion of left cornea, initial encounter  Subconjunctival hemorrhage of left eye     Discharge Instructions     Please use cool compresses to try to take down the amount of swelling and bleeding Use the erythromycin eye ointment several times a day.  Put a good amount and at bedtime Call the eye doctor first thing in the morning to get an appointment    ED Prescriptions    Medication Sig Dispense Auth. Provider   erythromycin ophthalmic ointment Place 1 application into the left eye 4 (four) times daily. Place a 1/4 inch ribbon of ointment into the lower eyelid. 1 g Eustace Moore, MD     Controlled Substance Prescriptions [place] Controlled Substance Registry consulted? Not Applicable   Eustace Moore, MD 11/19/18 1851    Eustace Moore, MD 11/19/18 [phone]

## 2018-11-20 DIAGNOSIS — H1132 Conjunctival hemorrhage, left eye: Secondary | ICD-10-CM | POA: Diagnosis not present

## 2018-11-27 DIAGNOSIS — Z23 Encounter for immunization: Secondary | ICD-10-CM | POA: Diagnosis not present

## 2018-12-01 MED FILL — FLUCONAZOLE 150 MG TABS: 150 | 3 days supply | Qty: 2 | Fill #1

## 2018-12-14 ENCOUNTER — Ambulatory Visit: Payer: 59 | Admitting: Internal Medicine

## 2018-12-21 DIAGNOSIS — Z6832 Body mass index (BMI) 32.0-32.9, adult: Secondary | ICD-10-CM | POA: Diagnosis not present

## 2018-12-21 DIAGNOSIS — Z1389 Encounter for screening for other disorder: Secondary | ICD-10-CM | POA: Diagnosis not present

## 2018-12-21 DIAGNOSIS — Z01419 Encounter for gynecological examination (general) (routine) without abnormal findings: Secondary | ICD-10-CM | POA: Diagnosis not present

## 2018-12-21 DIAGNOSIS — Z130 Encounter for screening for diseases of the blood and blood-forming organs and certain disorders involving the immune mechanism: Secondary | ICD-10-CM | POA: Diagnosis not present

## 2019-01-10 ENCOUNTER — Encounter: Payer: Self-pay | Admitting: Family Medicine

## 2019-01-10 ENCOUNTER — Ambulatory Visit (INDEPENDENT_AMBULATORY_CARE_PROVIDER_SITE_OTHER): Payer: 59 | Admitting: Family Medicine

## 2019-01-10 DIAGNOSIS — M533 Sacrococcygeal disorders, not elsewhere classified: Secondary | ICD-10-CM

## 2019-01-10 DIAGNOSIS — M999 Biomechanical lesion, unspecified: Secondary | ICD-10-CM | POA: Diagnosis not present

## 2019-01-10 NOTE — Progress Notes (Signed)
[facility] [address] [place], [place] [address] Phone: [phone] Subjective:     CC: Neck pain and back pain follow-up  [id number]  Christina [name] [name] is a 38 y.o. female coming in with complaint of neck pain and back pain.  Some increasing tightness.  Was at the beach the other weekend.  The patient was walking in the thing along.  Feels like this did not cause some discomfort.  Has known degenerative disc disease at L5-S1.  Patient is having no radicular symptoms just tightness in the back.  Continues to take anti-inflammatories on a regular basis    Past Medical History:  Diagnosis Date  . Back pain   . Eczema   . Major depressive disorder   . Migraine   . Normal pregnancy 03/11/2012  . Ovarian cyst   . Postpartum hemorrhage   . SVD (spontaneous vaginal delivery) 03/11/2012   No past surgical history on file. Social History   Socioeconomic History  . Marital status: Married    Spouse name: Sheria Lang  . Number of children: 2  . Years of education: Not on file  . Highest education level: Not on file  Occupational History  . Occupation: cma     Employer: [employer]  Social Needs  . Financial resource strain: Not on file  . Food insecurity    Worry: Not on file    Inability: Not on file  . Transportation needs    Medical: Not on file    Non-medical: Not on file  Tobacco Use  . Smoking status: Current Every Day Smoker    Packs/day: 0.50    Years: 20.00    Pack years: 10.00    Types: Cigarettes  . Smokeless tobacco: Never Used  Substance and Sexual Activity  . Alcohol use: Yes    Alcohol/week: 0.0 standard drinks    Comment: occassionally for social functions  . Drug use: No  . Sexual activity: Yes    Partners: Male  Lifestyle  . Physical activity    Days per week: Not on file    Minutes per session: Not on file  . Stress: Not on file  Relationships  . Social Musician on phone: Not on file    Gets together: Not on file    Attends religious service: Not on file    Active member of club or organization: Not on file    Attends meetings of clubs or organizations: Not on file    Relationship status: Not on file  Other Topics Concern  . Not on file  Social History Narrative  . Not on file   Allergies  Allergen Reactions  . Hydrocodone Itching  . Prednisone Other (See Comments)    Hyper, agitated, aggressive   Family History  Problem Relation Age of Onset  . Arthritis Father   . Sudden death Father 54       Sinus Cancer  . Cancer Father        nasal cancer  . Bipolar disorder Mother   . Depression Mother   . Anxiety disorder Mother   . Alcohol abuse Mother   . Drug abuse Mother   . Obesity Mother   . Sudden death Brother        GSW  . Arthritis Maternal Grandfather   . Hyperlipidemia Maternal Grandfather   . Heart disease Maternal Grandfather   . Breast cancer Maternal Grandmother   . Arthritis Maternal Grandmother   .  Lung cancer Maternal Grandmother   . Heart disease Maternal Grandmother   . Hyperlipidemia Paternal Grandmother   . Stroke Paternal Grandfather        Current Outpatient Medications (Analgesics):  .  diclofenac (VOLTAREN) 50 MG EC tablet, Take 1 tablet (50 mg total) by mouth 2 (two) times daily. Marland Kitchen  Ubrogepant (UBRELVY) 50 MG TABS, Take 1 tablet by mouth as needed.   Current Outpatient Medications (Other):  .  erythromycin ophthalmic ointment, Place 1 application into the left eye 4 (four) times daily. Place a 1/4 inch ribbon of ointment into the lower eyelid. .  fluocinonide cream (LIDEX) 2.29 %, Apply 1 application topically 2 (two) times daily. Marland Kitchen  gabapentin (NEURONTIN) 300 MG capsule, Take 1 capsule (300 mg total) by mouth at bedtime.    Past medical history, social, surgical and family history all reviewed in electronic medical record.  No pertanent information unless stated regarding to the chief complaint.   Review of Systems:  No  headache, visual changes, nausea, vomiting, diarrhea, constipation, dizziness, abdominal pain, skin rash, fevers, chills, night sweats, weight loss, swollen lymph nodes, body aches, joint swelling, muscle aches, chest pain, shortness of breath, mood changes.   Objective  Height 5\' 6"  (1.676 m), weight 192 lb (87.1 kg).     General: No apparent distress alert and oriented x3 mood and affect normal, dressed appropriately.  HEENT: Pupils equal, extraocular movements intact  Respiratory: Patient's speak in full sentences and does not appear short of breath  Cardiovascular: No lower extremity edema, non tender, no erythema  Skin: Warm dry intact with no signs of infection or rash on extremities or on axial skeleton.  Abdomen: Soft nontender  Neuro: Cranial nerves II through XII are intact, neurovascularly intact in all extremities with 2+ DTRs and 2+ pulses.  Lymph: No lymphadenopathy of posterior or anterior cervical chain or axillae bilaterally.  Gait normal with good balance and coordination.  MSK:  Non tender with full range of motion and good stability and symmetric strength and tone of shoulders, elbows, wrist, hip, knee and ankles bilaterally.  Back Exam:  Inspection: Loss of lordosis Motion: Flexion 40 deg, Extension 25 deg, Side Bending to 35 deg bilaterally,  Rotation to 45 deg bilaterally  SLR laying: Negative  XSLR laying: Negative  Palpable tenderness: Tender to palpation in the thoracolumbar lumbosacral juncture right greater than left.Marland Kitchen FABER: negative. Sensory change: Gross sensation intact to all lumbar and sacral dermatomes.  Reflexes: 2+ at both patellar tendons, 2+ at achilles tendons, Babinski's downgoing.  Strength at foot  Plantar-flexion: 5/5 Dorsi-flexion: 5/5 Eversion: 5/5 Inversion: 5/5  Leg strength  Quad: 5/5 Hamstring: 5/5 Hip flexor: 5/5 Hip abductors: 5/5    Osteopathic findings C2 flexed rotated and side bent right C6 flexed rotated and side bent left T3  extended rotated and side bent right inhaled third rib T9 extended rotated and side bent left L2 flexed rotated and side bent right Sacrum right on right    Impression and Recommendations:     This case required medical decision making of moderate complexity. The above documentation has been reviewed and is accurate and complete Lyndal Pulley, DO       Note: This dictation was prepared with Dragon dictation along with smaller phrase technology. Any transcriptional errors that result from this process are unintentional.

## 2019-01-10 NOTE — Assessment & Plan Note (Addendum)
Decision today to treat with OMT was based on Physical Exam  After verbal consent patient was treated with HVLA, ME, FPR techniques in cervical, thoracic, rib lumbar and sacral areas  Patient tolerated the procedure well with improvement in symptoms  Patient given exercises, stretches and lifestyle modifications  See medications in patient instructions if given  Patient will follow up in 4-6 weeks 

## 2019-01-10 NOTE — Assessment & Plan Note (Signed)
Stable with a mild exacerbation.  Responded well to manipulation.  Discussed posture and ergonomics, and discussed core strengthening.  Has gabapentin to take at night but she has not been taking it regularly.  Discussed the possibility also of taking other oral anti-inflammatories with patient avoiding at the moment.  Follow-up 2 to 3 months

## 2019-01-11 ENCOUNTER — Ambulatory Visit: Payer: 59 | Admitting: Family Medicine

## 2019-02-05 MED FILL — DICLOFENAC SOD EC 50 MG TAB: 50 | 90 days supply | Qty: 180 | Fill #1

## 2019-02-13 ENCOUNTER — Ambulatory Visit (INDEPENDENT_AMBULATORY_CARE_PROVIDER_SITE_OTHER): Payer: 59

## 2019-02-13 ENCOUNTER — Ambulatory Visit: Payer: 59

## 2019-02-13 DIAGNOSIS — Z23 Encounter for immunization: Secondary | ICD-10-CM | POA: Diagnosis not present

## 2019-03-07 ENCOUNTER — Encounter: Payer: Self-pay | Admitting: Internal Medicine

## 2019-03-07 ENCOUNTER — Ambulatory Visit (INDEPENDENT_AMBULATORY_CARE_PROVIDER_SITE_OTHER): Payer: 59 | Admitting: Internal Medicine

## 2019-03-07 ENCOUNTER — Ambulatory Visit (INDEPENDENT_AMBULATORY_CARE_PROVIDER_SITE_OTHER)
Admission: RE | Admit: 2019-03-07 | Discharge: 2019-03-07 | Disposition: A | Discharge status: Home / Self Care | Payer: 59 | Source: Ambulatory Visit | Attending: Internal Medicine | Admitting: Internal Medicine

## 2019-03-07 ENCOUNTER — Other Ambulatory Visit: Payer: Self-pay

## 2019-03-07 VITALS — BP 110/78 | HR 66 | Temp 98.0°F | Ht 66.0 in | Wt 196.1 lb

## 2019-03-07 DIAGNOSIS — Z6831 Body mass index (BMI) 31.0-31.9, adult: Secondary | ICD-10-CM

## 2019-03-07 DIAGNOSIS — G8929 Other chronic pain: Secondary | ICD-10-CM

## 2019-03-07 DIAGNOSIS — E66811 Obesity, class 1: Secondary | ICD-10-CM | POA: Insufficient documentation

## 2019-03-07 DIAGNOSIS — M5136 Other intervertebral disc degeneration, lumbar region: Secondary | ICD-10-CM | POA: Diagnosis not present

## 2019-03-07 DIAGNOSIS — M545 Low back pain, unspecified: Secondary | ICD-10-CM

## 2019-03-07 DIAGNOSIS — E6609 Other obesity due to excess calories: Secondary | ICD-10-CM

## 2019-03-07 MED ORDER — PHENTERMINE HCL 37.5 MG PO TABS: 37.5000 mg | ORAL_TABLET | Freq: Every day | ORAL | 2 refills | Status: DC

## 2019-03-07 MED ORDER — METHYLPREDNISOLONE ACETATE 80 MG/ML IJ SUSP
120.0000 mg | Freq: Once | INTRAMUSCULAR | Status: AC
  Administered 2019-03-07: 16:00:00 120 mg via INTRAMUSCULAR

## 2019-03-07 MED FILL — PHENTERMINE 37.5 MG TABLET: 37.5 | 30 days supply | Qty: 30 | Fill #0

## 2019-03-07 NOTE — Patient Instructions (Signed)
Acute Back Pain, Adult Acute back pain is sudden and usually short-lived. It is often caused by an injury to the muscles and tissues in the back. The injury may result from:  A muscle or ligament getting overstretched or torn (strained). Ligaments are tissues that connect bones to each other. Lifting something improperly can cause a back strain.  Wear and tear (degeneration) of the spinal disks. Spinal disks are circular tissue that provides cushioning between the bones of the spine (vertebrae).  Twisting motions, such as while playing sports or doing yard work.  A hit to the back.  Arthritis. You may have a physical exam, lab tests, and imaging tests to find the cause of your pain. Acute back pain usually goes away with rest and home care. Follow these instructions at home: Managing pain, stiffness, and swelling  Take over-the-counter and prescription medicines only as told by your health care provider.  Your health care provider may recommend applying ice during the first 24-48 hours after your pain starts. To do this: ? Put ice in a plastic bag. ? Place a towel between your skin and the bag. ? Leave the ice on for 20 minutes, 2-3 times a day.  If directed, apply heat to the affected area as often as told by your health care provider. Use the heat source that your health care provider recommends, such as a moist heat pack or a heating pad. ? Place a towel between your skin and the heat source. ? Leave the heat on for 20-30 minutes. ? Remove the heat if your skin turns bright red. This is especially important if you are unable to feel pain, heat, or cold. You have a greater risk of getting burned. Activity   Do not stay in bed. Staying in bed for more than 1-2 days can delay your recovery.  Sit up and stand up straight. Avoid leaning forward when you sit, or hunching over when you stand. ? If you work at a desk, sit close to it so you do not need to lean over. Keep your chin tucked  in. Keep your neck drawn back, and keep your elbows bent at a right angle. Your arms should look like the letter "L." ? Sit high and close to the steering wheel when you drive. Add lower back (lumbar) support to your car seat, if needed.  Take short walks on even surfaces as soon as you are able. Try to increase the length of time you walk each day.  Do not sit, drive, or stand in one place for more than 30 minutes at a time. Sitting or standing for long periods of time can put stress on your back.  Do not drive or use heavy machinery while taking prescription pain medicine.  Use proper lifting techniques. When you bend and lift, use positions that put less stress on your back: ? Bend your knees. ? Keep the load close to your body. ? Avoid twisting.  Exercise regularly as told by your health care provider. Exercising helps your back heal faster and helps prevent back injuries by keeping muscles strong and flexible.  Work with a physical therapist to make a safe exercise program, as recommended by your health care provider. Do any exercises as told by your physical therapist. Lifestyle  Maintain a healthy weight. Extra weight puts stress on your back and makes it difficult to have good posture.  Avoid activities or situations that make you feel anxious or stressed. Stress and anxiety increase muscle   tension and can make back pain worse. Learn ways to manage anxiety and stress, such as through exercise. General instructions  Sleep on a firm mattress in a comfortable position. Try lying on your side with your knees slightly bent. If you lie on your back, put a pillow under your knees.  Follow your treatment plan as told by your health care provider. This may include: ? Cognitive or behavioral therapy. ? Acupuncture or massage therapy. ? Meditation or yoga. Contact a health care provider if:  You have pain that is not relieved with rest or medicine.  You have increasing pain going down  into your legs or buttocks.  Your pain does not improve after 2 weeks.  You have pain at night.  You lose weight without trying.  You have a fever or chills. Get help right away if:  You develop new bowel or bladder control problems.  You have unusual weakness or numbness in your arms or legs.  You develop nausea or vomiting.  You develop abdominal pain.  You feel faint. Summary  Acute back pain is sudden and usually short-lived.  Use proper lifting techniques. When you bend and lift, use positions that put less stress on your back.  Take over-the-counter and prescription medicines and apply heat or ice as directed by your health care provider. This information is not intended to replace advice given to you by your health care provider. Make sure you discuss any questions you have with your health care provider. Document Released: 03/08/2005 Document Revised: 06/27/2018 Document Reviewed: 10/20/2016 Elsevier Patient Education  2020 [employer]  

## 2019-03-07 NOTE — Progress Notes (Signed)
Subjective:  Patient ID: Christina [name], female    DOB: July 05, 1980  Age: 38 y.o. MRN: [id number]  CC: Back Pain   HPI Christina [name] presents for f/u- She has a history of DDD in her lumbar spine and complains of a 1 month history of worsening low back pain.  She does not notice much radiation of the back pain but she complains of weakness in her lower extremities.  She denies numbness or tingling.  She is getting minimal symptom relief with diclofenac.  Outpatient Medications Prior to Visit  Medication Sig Dispense Refill  . diclofenac (VOLTAREN) 50 MG EC tablet Take 1 tablet (50 mg total) by mouth 2 (two) times daily. 180 tablet 1  . fluocinonide cream (LIDEX) 5.80 % Apply 1 application topically 2 (two) times daily. 60 g 2  . Ubrogepant (UBRELVY) 50 MG TABS Take 1 tablet by mouth as needed. 10 tablet 5  . erythromycin ophthalmic ointment Place 1 application into the left eye 4 (four) times daily. Place a 1/4 inch ribbon of ointment into the lower eyelid. 1 g 0  . gabapentin (NEURONTIN) 300 MG capsule Take 1 capsule (300 mg total) by mouth at bedtime. 90 capsule 1   No facility-administered medications prior to visit.    ROS Review of Systems  Constitutional: Positive for unexpected weight change (wt gain). Negative for chills, diaphoresis, fatigue and fever.  HENT: Negative.   Eyes: Negative.   Respiratory: Negative.  Negative for cough, chest tightness, shortness of breath and wheezing.   Cardiovascular: Negative for leg swelling.  Gastrointestinal: Negative for abdominal pain, constipation, nausea and vomiting.  Genitourinary: Negative.   Musculoskeletal: Positive for back pain. Negative for arthralgias and myalgias.  Skin: Negative.   Neurological: Positive for weakness. Negative for dizziness, light-headedness and numbness.  Hematological: Negative.   Psychiatric/Behavioral: Negative.     Objective:  BP 110/78 (BP Location: Left Arm,  Patient Position: Sitting, Cuff Size: Normal)   Pulse 66   Temp 98 F (36.7 C) (Oral)   Ht 5\' 6"  (1.676 m)   Wt 196 lb 1.9 oz (89 kg)   LMP 03/01/2019 (Exact Date)   SpO2 97%   BMI 31.65 kg/m   BP Readings from Last 3 Encounters:  03/07/19 110/78  11/19/18 109/79  11/02/18 120/74    Wt Readings from Last 3 Encounters:  03/07/19 196 lb 1.9 oz (89 kg)  01/10/19 192 lb (87.1 kg)  11/02/18 192 lb (87.1 kg)    Physical Exam Constitutional:      General: She is not in acute distress.    Appearance: She is not ill-appearing, toxic-appearing or diaphoretic.  HENT:     Nose: Nose normal.     Mouth/Throat:     Mouth: Mucous membranes are moist.  Eyes:     General: No scleral icterus.    Conjunctiva/sclera: Conjunctivae normal.  Cardiovascular:     Rate and Rhythm: Normal rate and regular rhythm.     Heart sounds: No murmur.  Pulmonary:     Effort: Pulmonary effort is normal.     Breath sounds: No stridor. No wheezing, rhonchi or rales.  Abdominal:     General: Abdomen is protuberant. Bowel sounds are normal. There is no distension.     Palpations: Abdomen is soft. There is no hepatomegaly or splenomegaly.  Musculoskeletal:        General: Normal range of motion.     Cervical back: Neck supple.     Lumbar back:  Bony tenderness present. No swelling, edema or deformity. Negative right straight leg raise test and negative left straight leg raise test.  Lymphadenopathy:     Cervical: No cervical adenopathy.  Skin:    General: Skin is warm and dry.  Neurological:     General: No focal deficit present.     Mental Status: She is alert.     Cranial Nerves: Cranial nerves are intact.     Sensory: Sensation is intact.     Motor: Motor function is intact. No weakness.     Coordination: Coordination is intact. Romberg sign negative.     Deep Tendon Reflexes: Reflexes normal.     Reflex Scores:      Tricep reflexes are 1+ on the right side and 1+ on the left side.      Bicep  reflexes are 1+ on the right side and 1+ on the left side.      Brachioradialis reflexes are 1+ on the right side and 1+ on the left side.      Patellar reflexes are 1+ on the right side and 1+ on the left side.      Achilles reflexes are 1+ on the right side and 1+ on the left side.    Lab Results  Component Value Date   WBC 9.2 04/24/2018   HGB 14.0 04/24/2018   HCT 42.0 04/24/2018   PLT 244.0 10/31/2017   GLUCOSE 92 04/24/2018   CHOL 168 04/24/2018   TRIG 54 04/24/2018   HDL 54 04/24/2018   LDLCALC 103 (H) 04/24/2018   ALT 12 04/24/2018   AST 14 04/24/2018   NA 139 04/24/2018   K 4.4 04/24/2018   CL 102 04/24/2018   CREATININE 0.73 04/24/2018   BUN 11 04/24/2018   CO2 25 04/24/2018   TSH 1.030 04/24/2018   HGBA1C 5.5 04/24/2018    DG Lumbar Spine Complete  Result Date: 03/08/2019 CLINICAL DATA:  Worsening lumbosacral back pain for 1 month. Chronic pain that has worsened recently. No known injury. Degenerative disc disease. Chronic bilateral low back pain without sciatica. EXAM: LUMBAR SPINE - COMPLETE 4+ VIEW COMPARISON:  Radiograph 12/22/2015 FINDINGS: The alignment is maintained. Vertebral body heights are normal. There is no listhesis. The posterior elements are intact. Disc space narrowing and endplate spurring at L5-S1. Unchanged from prior exam. Minor disc space narrowing at L1-L2. Mild L5-S1 facet hypertrophy. No fracture. Sacroiliac joints are symmetric and normal. Bilateral tubal occlusion devices in the pelvis. IMPRESSION: Stable degenerative change in the lumbar spine since 2017, most prominent at L5-S1. Electronically Signed   By: Narda Rutherford M.D.   On: 03/08/2019 01:39    Assessment & Plan:   Christina [name] was seen today for back pain.  Diagnoses and all orders for this visit:  DDD (degenerative disc disease), lumbar- She complains of worsening low back pain with lower extremity weakness.  Will try course of methylprednisolone to reduce the discomfort.   Plain films show disc space narrowing and endplate spurring at L5-S1.  I have asked her to undergo an MRI of the lumbar spine to see if there is nerve impingement, spinal stenosis, or tumor. -     DG Lumbar Spine Complete; Future -     methylPREDNISolone acetate (DEPO-MEDROL) injection 120 mg -     MR Lumbar Spine Wo Contrast; Future  Class 1 obesity due to excess calories without serious comorbidity with body mass index (BMI) of 31.0 to 31.9 in adult -     phentermine (  ADIPEX-P) 37.5 MG tablet; Take 1 tablet (37.5 mg total) by mouth daily before breakfast.  Chronic bilateral low back pain without sciatica -     DG Lumbar Spine Complete; Future -     methylPREDNISolone acetate (DEPO-MEDROL) injection 120 mg -     MR Lumbar Spine Wo Contrast; Future   I have discontinued Christina [name]'s gabapentin and erythromycin. I am also having her start on phentermine. Additionally, I am having her maintain her diclofenac, Ubrelvy, and fluocinonide cream. We administered methylPREDNISolone acetate.  Meds ordered this encounter  Medications  . phentermine (ADIPEX-P) 37.5 MG tablet    Sig: Take 1 tablet (37.5 mg total) by mouth daily before breakfast.    Dispense:  30 tablet    Refill:  2  . methylPREDNISolone acetate (DEPO-MEDROL) injection 120 mg     Follow-up: Return in about 3 months (around 06/05/2019).  Sanda Lingerhomas Mariella Blackwelder, MD

## 2019-03-08 ENCOUNTER — Encounter: Payer: Self-pay | Admitting: Internal Medicine

## 2019-03-14 ENCOUNTER — Ambulatory Visit (INDEPENDENT_AMBULATORY_CARE_PROVIDER_SITE_OTHER): Payer: 59

## 2019-03-14 ENCOUNTER — Other Ambulatory Visit: Payer: Self-pay

## 2019-03-14 DIAGNOSIS — Z23 Encounter for immunization: Secondary | ICD-10-CM

## 2019-03-15 DIAGNOSIS — Z23 Encounter for immunization: Secondary | ICD-10-CM

## 2019-04-02 ENCOUNTER — Other Ambulatory Visit: Payer: Self-pay

## 2019-04-02 ENCOUNTER — Ambulatory Visit
Admission: RE | Admit: 2019-04-02 | Discharge: 2019-04-02 | Disposition: A | Discharge status: Home / Self Care | Payer: 59 | Source: Ambulatory Visit | Attending: Internal Medicine | Admitting: Internal Medicine

## 2019-04-02 DIAGNOSIS — M5127 Other intervertebral disc displacement, lumbosacral region: Secondary | ICD-10-CM | POA: Diagnosis not present

## 2019-04-02 DIAGNOSIS — G8929 Other chronic pain: Secondary | ICD-10-CM

## 2019-04-02 DIAGNOSIS — M5136 Other intervertebral disc degeneration, lumbar region: Secondary | ICD-10-CM

## 2019-04-02 DIAGNOSIS — M4807 Spinal stenosis, lumbosacral region: Secondary | ICD-10-CM | POA: Diagnosis not present

## 2019-04-03 ENCOUNTER — Encounter: Payer: Self-pay | Admitting: Internal Medicine

## 2019-04-04 MED FILL — PHENTERMINE 37.5 MG TABLET: 37.5 | 30 days supply | Qty: 30 | Fill #1

## 2019-04-13 ENCOUNTER — Other Ambulatory Visit: Payer: Self-pay

## 2019-04-13 ENCOUNTER — Encounter: Payer: Self-pay | Admitting: Family Medicine

## 2019-04-13 ENCOUNTER — Ambulatory Visit: Payer: 59 | Admitting: Family Medicine

## 2019-04-13 VITALS — BP 100/80 | HR 78 | Ht 66.0 in | Wt 182.0 lb

## 2019-04-13 DIAGNOSIS — M47817 Spondylosis without myelopathy or radiculopathy, lumbosacral region: Secondary | ICD-10-CM | POA: Insufficient documentation

## 2019-04-13 DIAGNOSIS — M999 Biomechanical lesion, unspecified: Secondary | ICD-10-CM

## 2019-04-13 MED ORDER — KETOROLAC TROMETHAMINE 60 MG/2ML IM SOLN
60.0000 mg | Freq: Once | INTRAMUSCULAR | Status: AC
  Administered 2019-04-13: 60 mg via INTRAMUSCULAR

## 2019-04-13 MED ORDER — METHYLPREDNISOLONE ACETATE 80 MG/ML IJ SUSP
80.0000 mg | Freq: Once | INTRAMUSCULAR | Status: AC
  Administered 2019-04-13: 80 mg via INTRAMUSCULAR

## 2019-04-13 NOTE — Progress Notes (Signed)
Christina [name] [address] [place] Phone: [phone] Subjective:   I Christina [name] am serving as a Education administrator for Dr. Hulan Saas.  This visit occurred during the SARS-CoV-2 public health emergency.  Safety protocols were in place, including screening questions prior to the visit, additional usage of staff PPE, and extensive cleaning of exam room while observing appropriate contact time as indicated for disinfecting solutions.   I'm seeing this patient by the request  of:  Christina Lima, MD  CC: Low back pain  [id number]   01/10/2019 Stable with a mild exacerbation.  Responded well to manipulation.  Discussed posture and ergonomics, and discussed core strengthening.  Has gabapentin to take at night but she has not been taking it regularly.  Discussed the possibility also of taking other oral anti-inflammatories with patient avoiding at the moment.  Follow-up 2 to 3 months  04/13/2019 Christina [name] is a 39 y.o. female coming in with complaint of SI joint dysfunction. Back pain is terrible. MRI recently. Wants to know if neurosurgery is the route she should take.  Patient did have an MRI that we did go over together.  MRI did show an L5-S1 facet arthrosis left greater than right.  Patient had some mild bulging disc from L3-L4 L4-L5 and L5-S1 but no true nerve impingement or any spinal stenosis.  Patient denies any true radiation of pain but does have more discomfort and pain.    Past Medical History:  Diagnosis Date  . Back pain   . Eczema   . Major depressive disorder   . Migraine   . Normal pregnancy 03/11/2012  . Ovarian cyst   . Postpartum hemorrhage   . SVD (spontaneous vaginal delivery) 03/11/2012   No past surgical history on file. Social History   Socioeconomic History  . Marital status: Married    Spouse name: Christina [name]  . Number of children: 2  . Years of education: Not on file  . Highest education  level: Not on file  Occupational History  . Occupation: cma     Employer: [employer]  Tobacco Use  . Smoking status: Current Every Day Smoker    Packs/day: 0.50    Years: 20.00    Pack years: 10.00    Types: Cigarettes  . Smokeless tobacco: Never Used  Substance and Sexual Activity  . Alcohol use: Yes    Alcohol/week: 0.0 standard drinks    Comment: occassionally for social functions  . Drug use: No  . Sexual activity: Yes    Partners: Male  Other Topics Concern  . Not on file  Social History Narrative  . Not on file   Social Determinants of Health   Financial Resource Strain:   . Difficulty of Paying Living Expenses: Not on file  Food Insecurity:   . Worried About Charity fundraiser in the Last Year: Not on file  . Ran Out of Food in the Last Year: Not on file  Transportation Needs:   . Lack of Transportation (Medical): Not on file  . Lack of Transportation (Non-Medical): Not on file  Physical Activity:   . Days of Exercise per Week: Not on file  . Minutes of Exercise per Session: Not on file  Stress:   . Feeling of Stress : Not on file  Social Connections:   . Frequency of Communication with Friends and Family: Not on file  . Frequency of Social Gatherings with Friends and Family: Not on  file  . Attends Religious Services: Not on file  . Active Member of Clubs or Organizations: Not on file  . Attends Banker Meetings: Not on file  . Marital Status: Not on file   Allergies  Allergen Reactions  . Hydrocodone Itching  . Prednisone Other (See Comments)    Hyper, agitated, aggressive   Family History  Problem Relation Age of Onset  . Arthritis Father   . Sudden death Father 66       Sinus Cancer  . Cancer Father        nasal cancer  . Bipolar disorder Mother   . Depression Mother   . Anxiety disorder Mother   . Alcohol abuse Mother   . Drug abuse Mother   . Obesity Mother   . Sudden death Brother        GSW  . Arthritis Maternal  Grandfather   . Hyperlipidemia Maternal Grandfather   . Heart disease Maternal Grandfather   . Breast cancer Maternal Grandmother   . Arthritis Maternal Grandmother   . Lung cancer Maternal Grandmother   . Heart disease Maternal Grandmother   . Hyperlipidemia Paternal Grandmother   . Stroke Paternal Grandfather        Current Outpatient Medications (Analgesics):  .  diclofenac (VOLTAREN) 50 MG EC tablet, Take 1 tablet (50 mg total) by mouth 2 (two) times daily. Marland Kitchen  Ubrogepant (UBRELVY) 50 MG TABS, Take 1 tablet by mouth as needed.   Current Outpatient Medications (Other):  .  fluocinonide cream (LIDEX) 0.05 %, Apply 1 application topically 2 (two) times daily. .  phentermine (ADIPEX-P) 37.5 MG tablet, Take 1 tablet (37.5 mg total) by mouth daily before breakfast.    Past medical history, social, surgical and family history all reviewed in electronic medical record.  No pertanent information unless stated regarding to the chief complaint.   Review of Systems:  No headache, visual changes, nausea, vomiting, diarrhea, constipation, dizziness, abdominal pain, skin rash, fevers, chills, night sweats, weight loss, swollen lymph nodes, body aches, joint swelling, chest pain, shortness of breath, mood changes. POSITIVE muscle aches  Objective  There were no vitals taken for this visit.   General: No apparent distress alert and oriented x3 mood and affect normal, dressed appropriately.  HEENT: Pupils equal, extraocular movements intact  Respiratory: Patient's speak in full sentences and does not appear short of breath  Cardiovascular: No lower extremity edema, non tender, no erythema  Skin: Warm dry intact with no signs of infection or rash on extremities or on axial skeleton.  Abdomen: Soft nontender  Neuro: Cranial nerves II through XII are intact, neurovascularly intact in all extremities with 2+ DTRs and 2+ pulses.  Lymph: No lymphadenopathy of posterior or anterior cervical chain  or axillae bilaterally.  Gait normal with good balance and coordination.  MSK:  tender with full range of motion and good stability and symmetric strength and tone of shoulders, elbows, wrist, hip, knee and ankles bilaterally.  Back Exam:  Inspection: Unremarkable  Motion: Flexion 45 deg, Extension 25 deg, Side Bending to 35 deg bilaterally,  Rotation to 45 deg bilaterally  SLR laying: Negative  XSLR laying: Negative  Palpable tenderness: Tender to palpation in the paraspinal musculature at L4-L5 left greater than right. FABER: negative. Sensory change: Gross sensation intact to all lumbar and sacral dermatomes.  Reflexes: 2+ at both patellar tendons, 2+ at achilles tendons, Babinski's downgoing.  Strength at foot  Plantar-flexion: 5/5 Dorsi-flexion: 5/5 Eversion: 5/5 Inversion: 5/5  Leg strength  Quad: 5/5 Hamstring: 5/5 Hip flexor: 5/5 Hip abductors: 5/5  Gait unremarkable.  Osteopathic findings  C2 flexed rotated and side bent right C6 flexed rotated and side bent left T3 extended rotated and side bent right inhaled third rib T9 extended rotated and side bent left L4 flexed rotated and side bent left Sacrum right on right    Impression and Recommendations:     This case required medical decision making of moderate complexity. The above documentation has been reviewed and is accurate and complete Judi Saa, DO       Note: This dictation was prepared with Dragon dictation along with smaller phrase technology. Any transcriptional errors that result from this process are unintentional.

## 2019-04-13 NOTE — Assessment & Plan Note (Signed)
Decision today to treat with OMT was based on Physical Exam  After verbal consent patient was treated with HVLA, ME, FPR techniques in cervical, thoracic, lumbar and sacral areas  Patient tolerated the procedure well with improvement in symptoms  Patient given exercises, stretches and lifestyle modifications  See medications in patient instructions if given  Patient will follow up in 4-8 weeks 

## 2019-04-13 NOTE — Assessment & Plan Note (Signed)
Mild to moderate worse on the left than the right.  Discussed core strengthening, avoiding excessive extension of the back.  Gabapentin restarted which patient has had at home.  We discussed the possibility of Effexor in the long run, discussed the possibility of formal physical therapy but will start with home exercise program.  Responded well to osteopathic manipulation.  Follow-up again 4 to 5 weeks

## 2019-04-13 NOTE — Addendum Note (Signed)
Addended by: Evon Slack on: 04/13/2019 08:37 AM   Modules accepted: Orders

## 2019-04-13 NOTE — Patient Instructions (Signed)
Avoid back extension See me again in 4 weeks

## 2019-05-11 MED FILL — PHENTERMINE 37.5 MG TABLET: 37.5 | 30 days supply | Qty: 30 | Fill #2

## 2019-05-11 MED FILL — GABAPENTIN 300 MG CAPSULE: 300 | 90 days supply | Qty: 90 | Fill #1

## 2019-05-25 ENCOUNTER — Ambulatory Visit: Payer: 59 | Admitting: Family Medicine

## 2019-06-20 ENCOUNTER — Other Ambulatory Visit: Payer: Self-pay | Admitting: Gynecology

## 2019-06-20 DIAGNOSIS — N6452 Nipple discharge: Secondary | ICD-10-CM

## 2019-07-04 ENCOUNTER — Other Ambulatory Visit: Payer: Self-pay

## 2019-07-04 ENCOUNTER — Ambulatory Visit
Admission: RE | Admit: 2019-07-04 | Discharge: 2019-07-04 | Disposition: A | Discharge status: Home / Self Care | Payer: 59 | Source: Ambulatory Visit | Attending: Gynecology | Admitting: Gynecology

## 2019-07-04 ENCOUNTER — Other Ambulatory Visit: Payer: Self-pay | Admitting: Gynecology

## 2019-07-04 DIAGNOSIS — N6452 Nipple discharge: Secondary | ICD-10-CM

## 2019-07-04 DIAGNOSIS — R928 Other abnormal and inconclusive findings on diagnostic imaging of breast: Secondary | ICD-10-CM | POA: Diagnosis not present

## 2019-07-04 DIAGNOSIS — N6310 Unspecified lump in the right breast, unspecified quadrant: Secondary | ICD-10-CM

## 2019-07-04 DIAGNOSIS — N6312 Unspecified lump in the right breast, upper inner quadrant: Secondary | ICD-10-CM | POA: Diagnosis not present

## 2019-07-23 ENCOUNTER — Telehealth: Payer: Self-pay | Admitting: Family Medicine

## 2019-07-23 MED ORDER — CYCLOBENZAPRINE HCL 10 MG PO TABS: 10.0000 mg | ORAL_TABLET | Freq: Three times a day (TID) | ORAL | 0 refills | Status: DC | PRN

## 2019-07-23 MED FILL — CYCLOBENZAPRINE HCL 10 MG T: 10 | 10 days supply | Qty: 30 | Fill #0

## 2019-07-23 NOTE — Telephone Encounter (Signed)
Patient is requesting a refill on cyclobenzaprine sent to [facility].

## 2019-07-23 NOTE — Telephone Encounter (Signed)
Patient notified refill was sent in.

## 2019-07-23 NOTE — Telephone Encounter (Signed)
Cyclobenzaprine refill sent.

## 2019-07-24 ENCOUNTER — Encounter: Payer: Self-pay | Admitting: Internal Medicine

## 2019-07-24 ENCOUNTER — Ambulatory Visit (INDEPENDENT_AMBULATORY_CARE_PROVIDER_SITE_OTHER): Payer: 59 | Admitting: Internal Medicine

## 2019-07-24 ENCOUNTER — Ambulatory Visit: Payer: 59 | Admitting: Internal Medicine

## 2019-07-24 ENCOUNTER — Other Ambulatory Visit: Payer: Self-pay | Admitting: Internal Medicine

## 2019-07-24 ENCOUNTER — Ambulatory Visit (INDEPENDENT_AMBULATORY_CARE_PROVIDER_SITE_OTHER): Payer: 59

## 2019-07-24 VITALS — BP 100/62 | HR 86 | Resp 16 | Ht 66.0 in | Wt 170.0 lb

## 2019-07-24 DIAGNOSIS — Z23 Encounter for immunization: Secondary | ICD-10-CM

## 2019-07-24 DIAGNOSIS — M545 Low back pain, unspecified: Secondary | ICD-10-CM

## 2019-07-24 DIAGNOSIS — S80212A Abrasion, left knee, initial encounter: Secondary | ICD-10-CM

## 2019-07-24 DIAGNOSIS — M25551 Pain in right hip: Secondary | ICD-10-CM | POA: Diagnosis not present

## 2019-07-24 DIAGNOSIS — S79911A Unspecified injury of right hip, initial encounter: Secondary | ICD-10-CM | POA: Diagnosis not present

## 2019-07-24 DIAGNOSIS — S8992XA Unspecified injury of left lower leg, initial encounter: Secondary | ICD-10-CM

## 2019-07-24 DIAGNOSIS — M25562 Pain in left knee: Secondary | ICD-10-CM | POA: Diagnosis not present

## 2019-07-24 NOTE — Progress Notes (Signed)
Subjective:  Patient ID: Christina [name], female    DOB: 11-27-1980  Age: 39 y.o. MRN: [id number]  CC: Back Pain  This visit occurred during the SARS-CoV-2 public health emergency.  Safety protocols were in place, including screening questions prior to the visit, additional usage of staff PPE, and extensive cleaning of exam room while observing appropriate contact time as indicated for disinfecting solutions.    HPI [facility] [name] presents for concerns about MSK pain after a fall earlier today.  She was leaving her home when she tripped and fell and landed on the ground.  She has an abrasion and discomfort over her left anterior knee.  She can bear weight on the left lower extremity and has good range of motion.  She also has discomfort in the right posterior hip area.  Outpatient Medications Prior to Visit  Medication Sig Dispense Refill  . cyclobenzaprine (FLEXERIL) 10 MG tablet Take 1 tablet (10 mg total) by mouth 3 (three) times daily as needed for muscle spasms. 30 tablet 0  . diclofenac (VOLTAREN) 50 MG EC tablet Take 1 tablet (50 mg total) by mouth 2 (two) times daily. 180 tablet 1  . fluocinonide cream (LIDEX) 0.05 % Apply 1 application topically 2 (two) times daily. 60 g 2  . Ubrogepant (UBRELVY) 50 MG TABS Take 1 tablet by mouth as needed. 10 tablet 5  . phentermine (ADIPEX-P) 37.5 MG tablet Take 1 tablet (37.5 mg total) by mouth daily before breakfast. 30 tablet 2   No facility-administered medications prior to visit.    ROS Review of Systems  Constitutional: Negative.  Negative for diaphoresis and fatigue.  HENT: Negative.   Eyes: Negative for visual disturbance.  Respiratory: Negative for cough, chest tightness, shortness of breath and wheezing.   Cardiovascular: Negative for chest pain, palpitations and leg swelling.  Gastrointestinal: Negative for abdominal pain, diarrhea, nausea and vomiting.  Endocrine: Negative.   Genitourinary:  Negative.  Negative for difficulty urinating, dysuria and hematuria.  Musculoskeletal: Positive for arthralgias and back pain. Negative for gait problem, joint swelling, neck pain and neck stiffness.  Skin: Positive for wound. Negative for color change.  Neurological: Negative for dizziness, weakness, light-headedness and headaches.  Hematological: Negative for adenopathy. Does not bruise/bleed easily.  Psychiatric/Behavioral: Negative.     Objective:  BP 100/62   Pulse 86   Resp 16   Ht 5\' 6"  (1.676 m)   Wt 170 lb (77.1 kg)   LMP 07/06/2019 Comment: ESSURE  SpO2 99%   BMI 27.44 kg/m   BP Readings from Last 3 Encounters:  07/24/19 100/62  04/13/19 100/80  03/07/19 110/78    Wt Readings from Last 3 Encounters:  07/24/19 170 lb (77.1 kg)  04/13/19 182 lb (82.6 kg)  03/07/19 196 lb 1.9 oz (89 kg)    Physical Exam Vitals reviewed.  Constitutional:      Appearance: Normal appearance.  HENT:     Nose: Nose normal.     Mouth/Throat:     Mouth: Mucous membranes are moist.  Eyes:     General: No scleral icterus.    Conjunctiva/sclera: Conjunctivae normal.  Cardiovascular:     Rate and Rhythm: Normal rate and regular rhythm.     Heart sounds: No murmur.  Pulmonary:     Effort: Pulmonary effort is normal.     Breath sounds: No stridor. No wheezing, rhonchi or rales.  Abdominal:     General: Abdomen is flat. Bowel sounds are normal.  Palpations: There is no hepatomegaly, splenomegaly or mass.     Tenderness: There is no abdominal tenderness.  Musculoskeletal:        General: Deformity present. Normal range of motion.     Cervical back: Normal and neck supple.     Thoracic back: Normal.     Lumbar back: Normal. No swelling, edema, deformity, signs of trauma, spasms, tenderness or bony tenderness. Normal range of motion. Negative right straight leg raise test and negative left straight leg raise test.     Right hip: Normal. No deformity or bony tenderness. Normal range  of motion.     Left hip: Normal. No deformity or bony tenderness. Normal range of motion.     Right knee: Normal. No swelling.     Left knee: Deformity present. No swelling, ecchymosis or bony tenderness. Normal range of motion. Tenderness present. No medial joint line or lateral joint line tenderness. Normal alignment.       Legs:     Comments: Abrasion over left knee (TT) - see photo  Lymphadenopathy:     Cervical: No cervical adenopathy.  Skin:    General: Skin is warm and dry.  Neurological:     Mental Status: She is alert.     Lab Results  Component Value Date   WBC 9.2 04/24/2018   HGB 14.0 04/24/2018   HCT 42.0 04/24/2018   PLT 244.0 10/31/2017   GLUCOSE 92 04/24/2018   CHOL 168 04/24/2018   TRIG 54 04/24/2018   HDL 54 04/24/2018   LDLCALC 103 (H) 04/24/2018   ALT 12 04/24/2018   AST 14 04/24/2018   NA 139 04/24/2018   K 4.4 04/24/2018   CL 102 04/24/2018   CREATININE 0.73 04/24/2018   BUN 11 04/24/2018   CO2 25 04/24/2018   TSH 1.030 04/24/2018   HGBA1C 5.5 04/24/2018    US BREAST LTD UNI RIGHT INC AXILLA  Result Date: 07/04/2019 CLINICAL DATA:  Patient presents for evaluation of greenish discharge from the right nipple with palpation. EXAM: DIGITAL DIAGNOSTIC BILATERAL MAMMOGRAM WITH CAD AND TOMO ULTRASOUND RIGHT BREAST COMPARISON:  Previous exam(s). ACR Breast Density Category b: There are scattered areas of fibroglandular density. FINDINGS: There is a small oval circumscribed mass within the medial anterior right breast. No additional masses, calcifications or distortion identified within the right or left breast. Mammographic images were processed with CAD. On physical exam, a small amount of greenish discharge elicited with palpation from the right nipple. Targeted ultrasound is performed, showing no discrete dilated ducts or intraductal masses within the retroareolar right breast. Within the right breast 1 o'clock position retroareolar location there is a 3 x 2  x 2 mm mildly complicated cyst. IMPRESSION: 1. Likely physiologic non spontaneous greenish discharge from the right nipple. 2. Probably benign right breast mass 1 o'clock position favored to represent a complicated cyst. RECOMMENDATION: 1. Patient was instructed to continue to observe the right nipple discharge. Should the discharge become spontaneous or clear/bloody, she should return for additional evaluation, including breast MRI. 2. Right breast diagnostic ultrasound in 6 months to reassess probably benign right breast mass 1 o'clock position. I have discussed the findings and recommendations with the patient. If applicable, a reminder letter will be sent to the patient regarding the next appointment. BI-RADS CATEGORY  3: Probably benign. Electronically Signed   By: Annia Belt M.D.   On: 07/04/2019 10:41   MM DIAG BREAST TOMO BILATERAL  Result Date: 07/04/2019 CLINICAL DATA:  Patient presents for  evaluation of greenish discharge from the right nipple with palpation. EXAM: DIGITAL DIAGNOSTIC BILATERAL MAMMOGRAM WITH CAD AND TOMO ULTRASOUND RIGHT BREAST COMPARISON:  Previous exam(s). ACR Breast Density Category b: There are scattered areas of fibroglandular density. FINDINGS: There is a small oval circumscribed mass within the medial anterior right breast. No additional masses, calcifications or distortion identified within the right or left breast. Mammographic images were processed with CAD. On physical exam, a small amount of greenish discharge elicited with palpation from the right nipple. Targeted ultrasound is performed, showing no discrete dilated ducts or intraductal masses within the retroareolar right breast. Within the right breast 1 o'clock position retroareolar location there is a 3 x 2 x 2 mm mildly complicated cyst. IMPRESSION: 1. Likely physiologic non spontaneous greenish discharge from the right nipple. 2. Probably benign right breast mass 1 o'clock position favored to represent a  complicated cyst. RECOMMENDATION: 1. Patient was instructed to continue to observe the right nipple discharge. Should the discharge become spontaneous or clear/bloody, she should return for additional evaluation, including breast MRI. 2. Right breast diagnostic ultrasound in 6 months to reassess probably benign right breast mass 1 o'clock position. I have discussed the findings and recommendations with the patient. If applicable, a reminder letter will be sent to the patient regarding the next appointment. BI-RADS CATEGORY  3: Probably benign. Electronically Signed   By: Lovey Newcomer M.D.   On: 07/04/2019 10:41     DG Knee Complete 4 Views Left  Result Date: 07/24/2019 CLINICAL DATA:  Pain after fall EXAM: LEFT KNEE - COMPLETE 4+ VIEW COMPARISON:  10/28/2014 FINDINGS: Frontal, bilateral oblique, lateral views of the left knee are obtained. No fracture, subluxation, or dislocation. Joint spaces are well preserved. No joint effusion. IMPRESSION: 1. Unremarkable left knee. Electronically Signed   By: Randa Ngo M.D.   On: 07/24/2019 15:02   DG HIP UNILAT WITH PELVIS 2-3 VIEWS RIGHT  Result Date: 07/24/2019 CLINICAL DATA:  Pain after fall EXAM: DG HIP (WITH OR WITHOUT PELVIS) 2-3V RIGHT COMPARISON:  None. FINDINGS: Frontal view of the pelvis as well as frontal and frogleg lateral views of the right hip are obtained. No fracture, subluxation, or dislocation. Joint spaces are well preserved. Sacroiliac joints are normal. Tubal occlusion devices are seen within the pelvis. Remaining soft tissues are unremarkable. IMPRESSION: 1. No acute bony abnormality. Electronically Signed   By: Randa Ngo M.D.   On: 07/24/2019 15:01    Assessment & Plan:   Berdine was seen today for back pain.  Diagnoses and all orders for this visit:  Injury to joint of right side of pelvis, initial encounter- Based on her symptoms, exam, and plain films this is a contusion.  She will continue taking her current  anti-inflammatories. -     Cancel: DG Hip Unilat W OR W/O Pelvis Min 4 Views Right; Future  Left knee injury, initial encounter-based on her symptoms, exam, and plain films this is a contusion with abrasion.  She will continue take any anti-inflammatories to control her discomfort. -     DG Knee Complete 4 Views Left; Future  Acute right-sided low back pain without sciatica- Plain films are negative for fracture. -     Cancel: DG Hip Unilat W OR W/O Pelvis Min 4 Views Right; Future  Abrasion of left knee, initial encounter -     Tdap vaccine greater than or equal to 7yo IM   I have discontinued Anelle M. Cairrikier Davidson's phentermine. I am also having  her maintain her diclofenac, Ubrelvy, fluocinonide cream, and cyclobenzaprine.  No orders of the defined types were placed in this encounter.    Follow-up: No follow-ups on file.  Sanda Linger, MD

## 2019-07-27 ENCOUNTER — Encounter: Payer: Self-pay | Admitting: Internal Medicine

## 2019-07-27 NOTE — Patient Instructions (Signed)
Abrasion  An abrasion is a cut or a scrape on the surface of your skin. An abrasion does not go through all the layers of your skin. It is important to take good care of your abrasion to prevent infection. Follow these instructions at home: Medicines  Take or apply over-the-counter and prescription medicines only as told by your doctor.  If you were prescribed an antibiotic medicine, apply it as told by your doctor. Do not stop using the antibiotic even if you start to feel better. Wound care  Clean the wound 2-3 times a day or as often as told by your doctor. To do this: 1. Wash the wound with mild soap and water. 2. Rinse off the soap. 3. Pat a clean towel on the wound to dry it. Do not rub it.  Keep the bandage (dressing) clean and dry as told by your doctor.  Follow instructions from your doctor about how to take care of your wound. Make sure you: ? Wash your hands with soap and water before you change your bandage. If you cannot use soap and water, use hand sanitizer. ? Change your bandage as told by your doctor.  Check your wound every day for signs of infection. Check for: ? Redness, swelling, or pain. ? Fluid or blood. ? Warmth. ? Pus or a bad smell.  If directed, put ice on the injured area. To do this: ? Put ice in a plastic bag. ? Place a towel between your skin and the bag. ? Leave the ice on for 20 minutes, 2-3 times a day. General instructions  Do not take baths, swim, or use a hot tub until your doctor says it is okay.  If there is swelling, raise (elevate) the injured area above the level of your heart while you are sitting or lying down.  Keep all follow-up visits as told by your doctor. This is important. Contact a doctor if:  You were given a tetanus shot, and you have any of these where the needle went in: ? Swelling. ? Very bad pain. ? Redness. ? Bleeding.  You have a lot of pain, and medicine does not help.  You have any of these at the site of  the wound: ? More redness. ? More swelling. ? More pain. Get help right away if:  You have a red streak going away from your wound.  You have a fever.  You have fluid, blood, or pus coming from your wound.  There is a bad smell coming from your wound or bandage. Summary  An abrasion is a cut or a scrape on the surface of your skin.  Take good care of your abrasion so it does not get infected.  Clean the wound with mild soap and water, and change your bandage as told by your doctor.  Call your doctor if you have redness, swelling, or more pain in your wound.  Get help right away if you have a fever or if you have fluid, blood, pus, a bad smell, or a red streak coming from the wound. This information is not intended to replace advice given to you by your health care provider. Make sure you discuss any questions you have with your health care provider. Document Revised: 02/18/2017 Document Reviewed: 10/28/2016 Elsevier Patient Education  2020 Elsevier Inc.  

## 2019-08-07 ENCOUNTER — Other Ambulatory Visit: Payer: Self-pay

## 2019-08-07 ENCOUNTER — Ambulatory Visit: Payer: 59 | Admitting: Internal Medicine

## 2019-08-09 ENCOUNTER — Ambulatory Visit: Payer: 59 | Admitting: Internal Medicine

## 2019-08-15 ENCOUNTER — Other Ambulatory Visit: Payer: Self-pay

## 2019-08-15 ENCOUNTER — Encounter: Payer: Self-pay | Admitting: Internal Medicine

## 2019-08-15 ENCOUNTER — Ambulatory Visit (INDEPENDENT_AMBULATORY_CARE_PROVIDER_SITE_OTHER): Payer: 59 | Admitting: Internal Medicine

## 2019-08-15 ENCOUNTER — Ambulatory Visit: Payer: 59 | Attending: Internal Medicine

## 2019-08-15 VITALS — BP 130/80 | HR 88 | Temp 98.6°F | Resp 16 | Ht 66.0 in | Wt 168.2 lb

## 2019-08-15 DIAGNOSIS — E663 Overweight: Secondary | ICD-10-CM | POA: Insufficient documentation

## 2019-08-15 DIAGNOSIS — E6609 Other obesity due to excess calories: Secondary | ICD-10-CM

## 2019-08-15 DIAGNOSIS — Z20822 Contact with and (suspected) exposure to covid-19: Secondary | ICD-10-CM | POA: Diagnosis not present

## 2019-08-15 MED ORDER — PHENTERMINE HCL 37.5 MG PO TABS: 37.5000 mg | ORAL_TABLET | Freq: Every day | ORAL | 2 refills | Status: DC

## 2019-08-15 MED FILL — PHENTERMINE 37.5 MG TABLET: 37.5 | 30 days supply | Qty: 30 | Fill #0

## 2019-08-15 NOTE — Patient Instructions (Signed)
Phentermine tablets or capsules What is this medicine? PHENTERMINE (FEN ter meen) decreases your appetite. It is used with a reduced calorie diet and exercise to help you lose weight. This medicine may be used for other purposes; ask your health care provider or pharmacist if you have questions. COMMON BRAND NAME(S): Adipex-P, Atti-Plex P, Atti-Plex P Spansule, Fastin, Lomaira, Pro-Fast, Tara-8 What should I tell my health care provider before I take this medicine? They need to know if you have any of these conditions:  agitation or nervousness  diabetes  glaucoma  heart disease  high blood pressure  history of drug abuse or addiction  history of stroke  kidney disease  lung disease called Primary Pulmonary Hypertension (PPH)  taken an MAOI like Carbex, Eldepryl, Marplan, Nardil, or Parnate in last 14 days  taking stimulant medicines for attention disorders, weight loss, or to stay awake  thyroid disease  an unusual or allergic reaction to phentermine, other medicines, foods, dyes, or preservatives  pregnant or trying to get pregnant  breast-feeding How should I use this medicine? Take this medicine by mouth with a glass of water. Follow the directions on the prescription label. Take your medicine at regular intervals. Do not take it more often than directed. Do not stop taking except on your doctor's advice. Talk to your pediatrician regarding the use of this medicine in children. While this drug may be prescribed for children 17 years or older for selected conditions, precautions do apply. Overdosage: If you think you have taken too much of this medicine contact a poison control center or emergency room at once. NOTE: This medicine is only for you. Do not share this medicine with others. What if I miss a dose? If you miss a dose, take it as soon as you can. If it is almost time for your next dose, take only that dose. Do not take double or extra doses. What may interact  with this medicine? Do not take this medicine with any of the following medications:  MAOIs like Carbex, Eldepryl, Marplan, Nardil, and Parnate This medicine may also interact with the following medications:  alcohol  certain medicines for depression, anxiety, or psychotic disorders  certain medicines for high blood pressure  linezolid  medicines for colds or breathing difficulties like pseudoephedrine or phenylephrine  medicines for diabetes  sibutramine  stimulant medicines for attention disorders, weight loss, or to stay awake This list may not describe all possible interactions. Give your health care provider a list of all the medicines, herbs, non-prescription drugs, or dietary supplements you use. Also tell them if you smoke, drink alcohol, or use illegal drugs. Some items may interact with your medicine. What should I watch for while using this medicine? Visit your doctor or health care provider for regular checks on your progress. Do not stop taking except on your health care provider's advice. You may develop a severe reaction. Your health care provider will tell you how much medicine to take. Do not take this medicine close to bedtime. It may prevent you from sleeping. You may get drowsy or dizzy. Do not drive, use machinery, or do anything that needs mental alertness until you know how this medicine affects you. Do not stand or sit up quickly, especially if you are an older patient. This reduces the risk of dizzy or fainting spells. Alcohol may increase dizziness and drowsiness. Avoid alcoholic drinks. This medicine may affect blood sugar levels. Ask your healthcare provider if changes in diet or medicines are needed   if you have diabetes. Women should inform their health care provider if they wish to become pregnant or think they might be pregnant. Losing weight while pregnant is not advised and may cause harm to the unborn child. Talk to your health care provider for more  information. What side effects may I notice from receiving this medicine? Side effects that you should report to your doctor or health care professional as soon as possible:  allergic reactions like skin rash, itching or hives, swelling of the face, lips, or tongue  breathing problems  changes in emotions or moods  changes in vision  chest pain or chest tightness  fast, irregular heartbeat  feeling faint or lightheaded  increased blood pressure  irritable  restlessness  tremors  seizures  signs and symptoms of a stroke like changes in vision; confusion; trouble speaking or understanding; severe headaches; sudden numbness or weakness of the face, arm or leg; trouble walking; dizziness; loss of balance or coordination  unusually weak or tired Side effects that usually do not require medical attention (report to your doctor or health care professional if they continue or are bothersome):  changes in taste  constipation or diarrhea  dizziness  dry mouth  headache  trouble sleeping  upset stomach This list may not describe all possible side effects. Call your doctor for medical advice about side effects. You may report side effects to FDA at 1-800-FDA-1088. Where should I keep my medicine? Keep out of the reach of children. This medicine can be abused. Keep your medicine in a safe place to protect it from theft. Do not share this medicine with anyone. Selling or giving away this medicine is dangerous and against the law. This medicine may cause harm and death if it is taken by other adults, children, or pets. Return medicine that has not been used to an official disposal site. Contact the DEA at 1-800-882-9539 or your city/county government to find a site. If you cannot return the medicine, mix any unused medicine with a substance like cat litter or coffee grounds. Then throw the medicine away in a sealed container like a sealed bag or coffee can with a lid. Do not use the  medicine after the expiration date. Store at room temperature between 20 and 25 degrees C (68 and 77 degrees F). Keep container tightly closed. NOTE: This sheet is a summary. It may not cover all possible information. If you have questions about this medicine, talk to your doctor, pharmacist, or health care provider.  2020 Elsevier/Gold Standard (2019-01-12 12:54:20)  

## 2019-08-15 NOTE — Progress Notes (Signed)
Subjective:  Patient ID: Christina [name], female    DOB: 1981-01-31  Age: 39 y.o. MRN: [id number]  CC: Follow-up  This visit occurred during the SARS-CoV-2 public health emergency.  Safety protocols were in place, including screening questions prior to the visit, additional usage of staff PPE, and extensive cleaning of exam room while observing appropriate contact time as indicated for disinfecting solutions.    HPI [name] [name] presents for f/up - She has been able to lose weight with lifestyle modifications and phentermine.  She tolerates phentermine well with no anxiety, dysphoria, insomnia, palpitations, or dizziness.  She wants to continue taking phentermine.  Outpatient Medications Prior to Visit  Medication Sig Dispense Refill  . cyclobenzaprine (FLEXERIL) 10 MG tablet Take 1 tablet (10 mg total) by mouth 3 (three) times daily as needed for muscle spasms. 30 tablet 0  . diclofenac (VOLTAREN) 50 MG EC tablet Take 1 tablet (50 mg total) by mouth 2 (two) times daily. 180 tablet 1  . fluocinonide cream (LIDEX) 2.70 % Apply 1 application topically 2 (two) times daily. 60 g 2  . Ubrogepant (UBRELVY) 50 MG TABS Take 1 tablet by mouth as needed. 10 tablet 5   No facility-administered medications prior to visit.    ROS Review of Systems  Constitutional: Negative.  Negative for diaphoresis and fatigue.  HENT: Negative.   Eyes: Negative.   Respiratory: Negative for cough, chest tightness, shortness of breath and wheezing.   Cardiovascular: Negative for chest pain, palpitations and leg swelling.  Gastrointestinal: Negative for abdominal pain, constipation, diarrhea, nausea and vomiting.  Endocrine: Negative.   Genitourinary: Negative.  Negative for difficulty urinating.  Musculoskeletal: Negative.  Negative for arthralgias and back pain.  Skin: Negative.  Negative for color change.  Neurological: Negative.  Negative for dizziness, weakness,  light-headedness and headaches.  Hematological: Negative for adenopathy. Does not bruise/bleed easily.  Psychiatric/Behavioral: Negative.     Objective:  BP 130/80 (BP Location: Right Arm, Patient Position: Sitting, Cuff Size: Large)   Pulse 88   Temp 98.6 F (37 C) (Oral)   Resp 16   Ht 5\' 6"  (1.676 m)   Wt 168 lb 4 oz (76.3 kg)   SpO2 97%   BMI 27.16 kg/m   BP Readings from Last 3 Encounters:  08/15/19 130/80  07/24/19 100/62  04/13/19 100/80    Wt Readings from Last 3 Encounters:  08/15/19 168 lb 4 oz (76.3 kg)  07/24/19 170 lb (77.1 kg)  04/13/19 182 lb (82.6 kg)    Physical Exam Vitals reviewed.  Constitutional:      Appearance: Normal appearance.  HENT:     Nose: Nose normal.     Mouth/Throat:     Mouth: Mucous membranes are moist.  Eyes:     General: No scleral icterus.    Conjunctiva/sclera: Conjunctivae normal.  Cardiovascular:     Rate and Rhythm: Normal rate and regular rhythm.     Heart sounds: No murmur.  Pulmonary:     Effort: Pulmonary effort is normal.     Breath sounds: No stridor. No wheezing, rhonchi or rales.  Abdominal:     General: Abdomen is flat. Bowel sounds are normal. There is no distension.     Palpations: Abdomen is soft. There is no hepatomegaly, splenomegaly or mass.  Musculoskeletal:        General: Normal range of motion.     Cervical back: Neck supple.     Right lower leg: No edema.  Left lower leg: No edema.  Lymphadenopathy:     Cervical: No cervical adenopathy.  Skin:    General: Skin is warm and dry.  Neurological:     General: No focal deficit present.     Mental Status: She is alert.  Psychiatric:        Mood and Affect: Mood normal.        Behavior: Behavior normal.        Thought Content: Thought content normal.        Judgment: Judgment normal.     Lab Results  Component Value Date   WBC 9.2 04/24/2018   HGB 14.0 04/24/2018   HCT 42.0 04/24/2018   PLT 244.0 10/31/2017   GLUCOSE 92 04/24/2018    CHOL 168 04/24/2018   TRIG 54 04/24/2018   HDL 54 04/24/2018   LDLCALC 103 (H) 04/24/2018   ALT 12 04/24/2018   AST 14 04/24/2018   NA 139 04/24/2018   K 4.4 04/24/2018   CL 102 04/24/2018   CREATININE 0.73 04/24/2018   BUN 11 04/24/2018   CO2 25 04/24/2018   TSH 1.030 04/24/2018   HGBA1C 5.5 04/24/2018    US BREAST LTD UNI RIGHT INC AXILLA  Result Date: 07/04/2019 CLINICAL DATA:  Patient presents for evaluation of greenish discharge from the right nipple with palpation. EXAM: DIGITAL DIAGNOSTIC BILATERAL MAMMOGRAM WITH CAD AND TOMO ULTRASOUND RIGHT BREAST COMPARISON:  Previous exam(s). ACR Breast Density Category b: There are scattered areas of fibroglandular density. FINDINGS: There is a small oval circumscribed mass within the medial anterior right breast. No additional masses, calcifications or distortion identified within the right or left breast. Mammographic images were processed with CAD. On physical exam, a small amount of greenish discharge elicited with palpation from the right nipple. Targeted ultrasound is performed, showing no discrete dilated ducts or intraductal masses within the retroareolar right breast. Within the right breast 1 o'clock position retroareolar location there is a 3 x 2 x 2 mm mildly complicated cyst. IMPRESSION: 1. Likely physiologic non spontaneous greenish discharge from the right nipple. 2. Probably benign right breast mass 1 o'clock position favored to represent a complicated cyst. RECOMMENDATION: 1. Patient was instructed to continue to observe the right nipple discharge. Should the discharge become spontaneous or clear/bloody, she should return for additional evaluation, including breast MRI. 2. Right breast diagnostic ultrasound in 6 months to reassess probably benign right breast mass 1 o'clock position. I have discussed the findings and recommendations with the patient. If applicable, a reminder letter will be sent to the patient regarding the next  appointment. BI-RADS CATEGORY  3: Probably benign. Electronically Signed   By: Annia Belt M.D.   On: 07/04/2019 10:41   MM DIAG BREAST TOMO BILATERAL  Result Date: 07/04/2019 CLINICAL DATA:  Patient presents for evaluation of greenish discharge from the right nipple with palpation. EXAM: DIGITAL DIAGNOSTIC BILATERAL MAMMOGRAM WITH CAD AND TOMO ULTRASOUND RIGHT BREAST COMPARISON:  Previous exam(s). ACR Breast Density Category b: There are scattered areas of fibroglandular density. FINDINGS: There is a small oval circumscribed mass within the medial anterior right breast. No additional masses, calcifications or distortion identified within the right or left breast. Mammographic images were processed with CAD. On physical exam, a small amount of greenish discharge elicited with palpation from the right nipple. Targeted ultrasound is performed, showing no discrete dilated ducts or intraductal masses within the retroareolar right breast. Within the right breast 1 o'clock position retroareolar location there is a 3 x 2  x 2 mm mildly complicated cyst. IMPRESSION: 1. Likely physiologic non spontaneous greenish discharge from the right nipple. 2. Probably benign right breast mass 1 o'clock position favored to represent a complicated cyst. RECOMMENDATION: 1. Patient was instructed to continue to observe the right nipple discharge. Should the discharge become spontaneous or clear/bloody, she should return for additional evaluation, including breast MRI. 2. Right breast diagnostic ultrasound in 6 months to reassess probably benign right breast mass 1 o'clock position. I have discussed the findings and recommendations with the patient. If applicable, a reminder letter will be sent to the patient regarding the next appointment. BI-RADS CATEGORY  3: Probably benign. Electronically Signed   By: Annia Belt M.D.   On: 07/04/2019 10:41    Assessment & Plan:   Riata was seen today for follow-up.  Diagnoses and all  orders for this visit:  Overweight (BMI 25.0-29.9)- She was praised for her lifestyle modifications and success with her weight loss regimen.  Will continue phentermine for the next 3 months. -     phentermine (ADIPEX-P) 37.5 MG tablet; Take 1 tablet (37.5 mg total) by mouth daily before breakfast.  Class 1 obesity due to excess calories without serious comorbidity with body mass index (BMI) of 31.0 to 31.9 in adult -     phentermine (ADIPEX-P) 37.5 MG tablet; Take 1 tablet (37.5 mg total) by mouth daily before breakfast.   I am having Pernell M. [name] maintain her diclofenac, Ubrelvy, fluocinonide cream, cyclobenzaprine, and phentermine.  Meds ordered this encounter  Medications  . phentermine (ADIPEX-P) 37.5 MG tablet    Sig: Take 1 tablet (37.5 mg total) by mouth daily before breakfast.    Dispense:  30 tablet    Refill:  2     Follow-up: Return in about 3 months (around 11/15/2019).  Sanda Linger, MD

## 2019-08-16 LAB — NOVEL CORONAVIRUS, NAA: SARS-CoV-2, NAA: NOT DETECTED

## 2019-08-16 LAB — SARS-COV-2, NAA 2 DAY TAT

## 2019-08-23 MED FILL — FLUOCINONIDE 0.05% CREAM: 0.05 | 30 days supply | Qty: 60 | Fill #1

## 2019-09-03 ENCOUNTER — Ambulatory Visit: Payer: 59 | Attending: Internal Medicine

## 2019-09-03 DIAGNOSIS — Z20822 Contact with and (suspected) exposure to covid-19: Secondary | ICD-10-CM

## 2019-09-04 ENCOUNTER — Other Ambulatory Visit: Payer: Self-pay

## 2019-09-04 ENCOUNTER — Ambulatory Visit (INDEPENDENT_AMBULATORY_CARE_PROVIDER_SITE_OTHER): Payer: 59 | Admitting: *Deleted

## 2019-09-04 DIAGNOSIS — Z23 Encounter for immunization: Secondary | ICD-10-CM | POA: Diagnosis not present

## 2019-09-04 LAB — SARS-COV-2, NAA 2 DAY TAT

## 2019-09-04 LAB — NOVEL CORONAVIRUS, NAA: SARS-CoV-2, NAA: NOT DETECTED

## 2019-10-02 ENCOUNTER — Ambulatory Visit (INDEPENDENT_AMBULATORY_CARE_PROVIDER_SITE_OTHER): Payer: 59 | Admitting: Internal Medicine

## 2019-10-02 ENCOUNTER — Ambulatory Visit (INDEPENDENT_AMBULATORY_CARE_PROVIDER_SITE_OTHER): Payer: 59

## 2019-10-02 ENCOUNTER — Other Ambulatory Visit: Payer: Self-pay

## 2019-10-02 VITALS — BP 120/78 | HR 87 | Temp 98.2°F | Ht 66.0 in | Wt 169.4 lb

## 2019-10-02 DIAGNOSIS — M542 Cervicalgia: Secondary | ICD-10-CM | POA: Diagnosis not present

## 2019-10-02 DIAGNOSIS — R937 Abnormal findings on diagnostic imaging of other parts of musculoskeletal system: Secondary | ICD-10-CM

## 2019-10-02 DIAGNOSIS — M5412 Radiculopathy, cervical region: Secondary | ICD-10-CM

## 2019-10-02 DIAGNOSIS — M50322 Other cervical disc degeneration at C5-C6 level: Secondary | ICD-10-CM | POA: Diagnosis not present

## 2019-10-02 MED FILL — PHENTERMINE 37.5 MG TABLET: 37.5 | 30 days supply | Qty: 30 | Fill #1

## 2019-10-02 NOTE — Progress Notes (Signed)
Subjective:  Patient ID: Christina [name], female    DOB: 10-18-1980  Age: 39 y.o. MRN: [id number]  CC: Numbness (Numbness R arm)  This visit occurred during the SARS-CoV-2 public health emergency.  Safety protocols were in place, including screening questions prior to the visit, additional usage of staff PPE, and extensive cleaning of exam room while observing appropriate contact time as indicated for disinfecting solutions.    HPI [facility] Christina [name] presents for the complaint of numbness in her right upper extremity with fine motor weakness in her right hand.  She does repetitive activities but does not recall any specific trauma or injury.  She has mild discomfort and a popping sensation in her neck.  She has no neck pain that radiates towards her extremities.  For the pain she is getting adequate symptom relief with oral Diflucan and a muscle relaxer.  Outpatient Medications Prior to Visit  Medication Sig Dispense Refill   diclofenac (VOLTAREN) 50 MG EC tablet Take 1 tablet (50 mg total) by mouth 2 (two) times daily. 180 tablet 1   fluocinonide cream (LIDEX) 0.05 % Apply 1 application topically 2 (two) times daily. 60 g 2   phentermine (ADIPEX-P) 37.5 MG tablet Take 1 tablet (37.5 mg total) by mouth daily before breakfast. 30 tablet 2   cyclobenzaprine (FLEXERIL) 10 MG tablet Take 1 tablet (10 mg total) by mouth 3 (three) times daily as needed for muscle spasms. (Patient not taking: Reported on 10/02/2019) 30 tablet 0   Ubrogepant (UBRELVY) 50 MG TABS Take 1 tablet by mouth as needed. (Patient not taking: Reported on 10/02/2019) 10 tablet 5   No facility-administered medications prior to visit.    ROS Review of Systems  Constitutional: Negative for chills, fatigue and fever.  HENT: Negative.  Negative for trouble swallowing.   Eyes: Negative.   Respiratory: Negative for cough, chest tightness, shortness of breath and wheezing.   Cardiovascular:  Negative for chest pain, palpitations and leg swelling.  Gastrointestinal: Negative for abdominal pain.  Genitourinary: Negative.  Negative for difficulty urinating.  Musculoskeletal: Positive for neck pain. Negative for arthralgias, back pain and myalgias.  Skin: Negative.  Negative for rash.  Neurological: Positive for weakness and numbness. Negative for light-headedness.  Hematological: Negative for adenopathy. Does not bruise/bleed easily.  Psychiatric/Behavioral: Negative.     Objective:  BP 120/78 (BP Location: Left Arm, Patient Position: Sitting, Cuff Size: Normal)    Pulse 87    Temp 98.2 F (36.8 C) (Oral)    Ht 5\' 6"  (1.676 m)    Wt 169 lb 6.4 oz (76.8 kg)    LMP 09/26/2019    SpO2 98%    BMI 27.34 kg/m   BP Readings from Last 3 Encounters:  10/02/19 120/78  08/15/19 130/80  07/24/19 100/62    Wt Readings from Last 3 Encounters:  10/02/19 169 lb 6.4 oz (76.8 kg)  08/15/19 168 lb 4 oz (76.3 kg)  07/24/19 170 lb (77.1 kg)    Physical Exam Vitals reviewed.  Constitutional:      Appearance: Normal appearance.  HENT:     Nose: Nose normal.     Mouth/Throat:     Mouth: Mucous membranes are moist.  Eyes:     General: No scleral icterus.    Conjunctiva/sclera: Conjunctivae normal.  Cardiovascular:     Rate and Rhythm: Normal rate and regular rhythm.     Pulses: Normal pulses.     Heart sounds: No murmur heard.   Pulmonary:  Effort: Pulmonary effort is normal.     Breath sounds: No stridor. No wheezing, rhonchi or rales.  Abdominal:     General: Abdomen is flat.     Palpations: There is no mass.     Tenderness: There is no abdominal tenderness.  Musculoskeletal:        General: No tenderness. Normal range of motion.     Cervical back: Neck supple. No edema, deformity, signs of trauma, rigidity, spasms, tenderness or bony tenderness. No pain with movement. Normal range of motion.     Thoracic back: Normal.  Lymphadenopathy:     Cervical: No cervical  adenopathy.  Skin:    General: Skin is warm and dry.  Neurological:     General: No focal deficit present.     Mental Status: She is alert and oriented to person, place, and time.     Cranial Nerves: Cranial nerves are intact. No cranial nerve deficit.     Sensory: Sensation is intact. No sensory deficit.     Motor: Weakness present. No atrophy or abnormal muscle tone.     Coordination: Coordination is intact.     Gait: Gait normal.     Deep Tendon Reflexes: Reflexes normal.     Reflex Scores:      Tricep reflexes are 1+ on the right side and 1+ on the left side.      Bicep reflexes are 1+ on the right side and 1+ on the left side.      Brachioradialis reflexes are 1+ on the right side and 1+ on the left side.      Patellar reflexes are 1+ on the right side and 1+ on the left side.      Achilles reflexes are 1+ on the right side and 1+ on the left side.    Comments: Mild weakness with flexion in the right ring finger and apposition of the fifth finger against the thumb.  Psychiatric:        Mood and Affect: Mood normal.        Behavior: Behavior normal.     Lab Results  Component Value Date   WBC 9.2 04/24/2018   HGB 14.0 04/24/2018   HCT 42.0 04/24/2018   PLT 244.0 10/31/2017   GLUCOSE 92 04/24/2018   CHOL 168 04/24/2018   TRIG 54 04/24/2018   HDL 54 04/24/2018   LDLCALC 103 (H) 04/24/2018   ALT 12 04/24/2018   AST 14 04/24/2018   NA 139 04/24/2018   K 4.4 04/24/2018   CL 102 04/24/2018   CREATININE 0.73 04/24/2018   BUN 11 04/24/2018   CO2 25 04/24/2018   TSH 1.030 04/24/2018   HGBA1C 5.5 04/24/2018    US BREAST LTD UNI RIGHT INC AXILLA  Result Date: 07/04/2019 CLINICAL DATA:  Patient presents for evaluation of greenish discharge from the right nipple with palpation. EXAM: DIGITAL DIAGNOSTIC BILATERAL MAMMOGRAM WITH CAD AND TOMO ULTRASOUND RIGHT BREAST COMPARISON:  Previous exam(s). ACR Breast Density Category b: There are scattered areas of fibroglandular density.  FINDINGS: There is a small oval circumscribed mass within the medial anterior right breast. No additional masses, calcifications or distortion identified within the right or left breast. Mammographic images were processed with CAD. On physical exam, a small amount of greenish discharge elicited with palpation from the right nipple. Targeted ultrasound is performed, showing no discrete dilated ducts or intraductal masses within the retroareolar right breast. Within the right breast 1 o'clock position retroareolar location there is a 3  x 2 x 2 mm mildly complicated cyst. IMPRESSION: 1. Likely physiologic non spontaneous greenish discharge from the right nipple. 2. Probably benign right breast mass 1 o'clock position favored to represent a complicated cyst. RECOMMENDATION: 1. Patient was instructed to continue to observe the right nipple discharge. Should the discharge become spontaneous or clear/bloody, she should return for additional evaluation, including breast MRI. 2. Right breast diagnostic ultrasound in 6 months to reassess probably benign right breast mass 1 o'clock position. I have discussed the findings and recommendations with the patient. If applicable, a reminder letter will be sent to the patient regarding the next appointment. BI-RADS CATEGORY  3: Probably benign. Electronically Signed   By: Annia Belt M.D.   On: 07/04/2019 10:41   MM DIAG BREAST TOMO BILATERAL  Result Date: 07/04/2019 CLINICAL DATA:  Patient presents for evaluation of greenish discharge from the right nipple with palpation. EXAM: DIGITAL DIAGNOSTIC BILATERAL MAMMOGRAM WITH CAD AND TOMO ULTRASOUND RIGHT BREAST COMPARISON:  Previous exam(s). ACR Breast Density Category b: There are scattered areas of fibroglandular density. FINDINGS: There is a small oval circumscribed mass within the medial anterior right breast. No additional masses, calcifications or distortion identified within the right or left breast. Mammographic images were  processed with CAD. On physical exam, a small amount of greenish discharge elicited with palpation from the right nipple. Targeted ultrasound is performed, showing no discrete dilated ducts or intraductal masses within the retroareolar right breast. Within the right breast 1 o'clock position retroareolar location there is a 3 x 2 x 2 mm mildly complicated cyst. IMPRESSION: 1. Likely physiologic non spontaneous greenish discharge from the right nipple. 2. Probably benign right breast mass 1 o'clock position favored to represent a complicated cyst. RECOMMENDATION: 1. Patient was instructed to continue to observe the right nipple discharge. Should the discharge become spontaneous or clear/bloody, she should return for additional evaluation, including breast MRI. 2. Right breast diagnostic ultrasound in 6 months to reassess probably benign right breast mass 1 o'clock position. I have discussed the findings and recommendations with the patient. If applicable, a reminder letter will be sent to the patient regarding the next appointment. BI-RADS CATEGORY  3: Probably benign. Electronically Signed   By: Annia Belt M.D.   On: 07/04/2019 10:41   DG Cervical Spine Complete  Result Date: 10/03/2019 CLINICAL DATA:  Insert locale GIA with right upper extremity radicular symptoms EXAM: CERVICAL SPINE - COMPLETE 4+ VIEW COMPARISON:  None. FINDINGS: Frontal, lateral, open-mouth odontoid, and bilateral oblique views were obtained. There is no fracture or spondylolisthesis. Prevertebral soft tissues and predental space regions are normal. There is mild disc space narrowing at C5-6. Other disc spaces appear unremarkable. There is no appreciable exit foraminal narrowing on the oblique views. There is slight reversal of lordotic curvature. Lung apices are clear. IMPRESSION: Slight disc space narrowing at C5-6. Other disc spaces appear unremarkable. No fracture or spondylolisthesis. Mild reversal of lordotic curvature is likely  indicative of a degree of muscle spasm. Electronically Signed   By: Bretta Bang III M.D.   On: 10/03/2019 10:59    Assessment & Plan:   Frimet was seen today for numbness.  Diagnoses and all orders for this visit:  Neck pain on right side -     DG Cervical Spine Complete; Future -     MR Cervical Spine Wo Contrast; Future  Abnormal x-ray of cervical spine -     MR Cervical Spine Wo Contrast; Future  Radiculitis of right cervical region-  She has radicular symptoms and weakness in her right hand.  Her plain films show disc space narrowing at C5-C6.  I recommended that she start using cervical traction and undergo an MRI to see if there is degenerative changes, spurring, spinal stenosis, or nerve impingement. -     MR Cervical Spine Wo Contrast; Future   I am having Kimaria M. Christina Ignacia Palmaavidson maintain her diclofenac, Ubrelvy, fluocinonide cream, cyclobenzaprine, and phentermine.  No orders of the defined types were placed in this encounter.    Follow-up: No follow-ups on file.  Sanda Lingerhomas Miliano Cotten, MD

## 2019-10-03 DIAGNOSIS — M5412 Radiculopathy, cervical region: Secondary | ICD-10-CM | POA: Insufficient documentation

## 2019-10-03 DIAGNOSIS — R937 Abnormal findings on diagnostic imaging of other parts of musculoskeletal system: Secondary | ICD-10-CM | POA: Insufficient documentation

## 2019-10-04 ENCOUNTER — Encounter: Payer: Self-pay | Admitting: Internal Medicine

## 2019-10-19 NOTE — Addendum Note (Signed)
Addended by: Deatra James on: 10/19/2019 12:23 PM   Modules accepted: Orders

## 2019-10-28 ENCOUNTER — Other Ambulatory Visit: Payer: Self-pay

## 2019-10-28 ENCOUNTER — Ambulatory Visit
Admission: RE | Admit: 2019-10-28 | Discharge: 2019-10-28 | Disposition: A | Discharge status: Home / Self Care | Payer: 59 | Source: Ambulatory Visit | Attending: Internal Medicine | Admitting: Internal Medicine

## 2019-10-28 DIAGNOSIS — M5412 Radiculopathy, cervical region: Secondary | ICD-10-CM

## 2019-10-28 DIAGNOSIS — M5011 Cervical disc disorder with radiculopathy,  high cervical region: Secondary | ICD-10-CM | POA: Diagnosis not present

## 2019-10-28 DIAGNOSIS — M542 Cervicalgia: Secondary | ICD-10-CM

## 2019-10-28 DIAGNOSIS — R937 Abnormal findings on diagnostic imaging of other parts of musculoskeletal system: Secondary | ICD-10-CM

## 2019-10-28 DIAGNOSIS — M50122 Cervical disc disorder at C5-C6 level with radiculopathy: Secondary | ICD-10-CM | POA: Diagnosis not present

## 2019-10-29 ENCOUNTER — Other Ambulatory Visit: Payer: Self-pay | Admitting: Internal Medicine

## 2019-10-29 DIAGNOSIS — M5010 Cervical disc disorder with radiculopathy, unspecified cervical region: Secondary | ICD-10-CM | POA: Insufficient documentation

## 2019-11-02 DIAGNOSIS — M542 Cervicalgia: Secondary | ICD-10-CM | POA: Diagnosis not present

## 2019-11-02 DIAGNOSIS — M5412 Radiculopathy, cervical region: Secondary | ICD-10-CM | POA: Diagnosis not present

## 2019-11-02 DIAGNOSIS — M5020 Other cervical disc displacement, unspecified cervical region: Secondary | ICD-10-CM | POA: Diagnosis not present

## 2019-11-05 ENCOUNTER — Ambulatory Visit (INDEPENDENT_AMBULATORY_CARE_PROVIDER_SITE_OTHER): Payer: 59 | Admitting: Internal Medicine

## 2019-11-05 ENCOUNTER — Other Ambulatory Visit: Payer: Self-pay

## 2019-11-05 ENCOUNTER — Other Ambulatory Visit: Payer: 59

## 2019-11-05 VITALS — BP 116/78 | HR 85 | Temp 98.4°F | Ht 66.0 in | Wt 163.6 lb

## 2019-11-05 DIAGNOSIS — Z0000 Encounter for general adult medical examination without abnormal findings: Secondary | ICD-10-CM | POA: Diagnosis not present

## 2019-11-05 DIAGNOSIS — M5010 Cervical disc disorder with radiculopathy, unspecified cervical region: Secondary | ICD-10-CM

## 2019-11-05 DIAGNOSIS — Z202 Contact with and (suspected) exposure to infections with a predominantly sexual mode of transmission: Secondary | ICD-10-CM | POA: Insufficient documentation

## 2019-11-05 DIAGNOSIS — M5412 Radiculopathy, cervical region: Secondary | ICD-10-CM | POA: Diagnosis not present

## 2019-11-05 NOTE — Progress Notes (Signed)
Subjective:  Patient ID: Christina [name], female    DOB: July 06, 1980  Age: 39 y.o. MRN: [id number]  CC: Annual Exam  This visit occurred during the SARS-CoV-2 public health emergency.  Safety protocols were in place, including screening questions prior to the visit, additional usage of staff PPE, and extensive cleaning of exam room while observing appropriate contact time as indicated for disinfecting solutions.    HPI Myrene M [name] presents for a CPX.  She recently had a high risk sexual encounter and was told that she might have been exposed to a unspecified sexually transmitted infection.  Though she does not have any symptoms she wants to be screened.  She has had neck pain that radiates into her right upper extremity with numbness and weakness in the right upper extremity.  Her cervical spine MRI was abnormal.  She saw neurosurgery a few days ago and surgery has been recommended.  She is not sure that she wants to do that quite yet.  She is getting adequate symptom relief with the current meds.  Outpatient Medications Prior to Visit  Medication Sig Dispense Refill  . diclofenac (VOLTAREN) 50 MG EC tablet Take 1 tablet (50 mg total) by mouth 2 (two) times daily. 180 tablet 1  . fluocinonide cream (LIDEX) 0.05 % Apply 1 application topically 2 (two) times daily. 60 g 2  . phentermine (ADIPEX-P) 37.5 MG tablet Take 1 tablet (37.5 mg total) by mouth daily before breakfast. 30 tablet 2  . cyclobenzaprine (FLEXERIL) 10 MG tablet Take 1 tablet (10 mg total) by mouth 3 (three) times daily as needed for muscle spasms. (Patient not taking: Reported on 11/05/2019) 30 tablet 0  . Ubrogepant (UBRELVY) 50 MG TABS Take 1 tablet by mouth as needed. (Patient not taking: Reported on 11/05/2019) 10 tablet 5   No facility-administered medications prior to visit.    ROS Review of Systems  Constitutional: Negative.  Negative for chills, diaphoresis, fatigue and fever.    HENT: Negative.  Negative for sore throat and trouble swallowing.   Eyes: Negative.   Respiratory: Negative for cough, chest tightness, shortness of breath and wheezing.   Cardiovascular: Negative for chest pain, palpitations and leg swelling.  Gastrointestinal: Negative for abdominal pain, constipation, diarrhea, nausea and vomiting.  Endocrine: Negative.   Genitourinary: Negative for difficulty urinating, dysuria, genital sores, hematuria, vaginal bleeding and vaginal discharge.  Musculoskeletal: Positive for neck pain. Negative for arthralgias, joint swelling and myalgias.  Skin: Negative.  Negative for color change, pallor and rash.  Neurological: Positive for weakness and numbness. Negative for dizziness.  Hematological: Negative for adenopathy. Does not bruise/bleed easily.  Psychiatric/Behavioral: Negative.     Objective:  BP 116/78 (BP Location: Left Arm, Patient Position: Sitting, Cuff Size: Normal)   Pulse 85   Temp 98.4 F (36.9 C) (Oral)   Ht 5\' 6"  (1.676 m)   Wt 163 lb 9.6 oz (74.2 kg)   LMP 10/19/2019   SpO2 99%   BMI 26.41 kg/m   BP Readings from Last 3 Encounters:  11/06/19 110/78  11/05/19 116/78  10/02/19 120/78    Wt Readings from Last 3 Encounters:  11/06/19 163 lb (73.9 kg)  11/05/19 163 lb 9.6 oz (74.2 kg)  10/02/19 169 lb 6.4 oz (76.8 kg)    Physical Exam Vitals reviewed.  Constitutional:      Appearance: Normal appearance.  HENT:     Nose: Nose normal.     Mouth/Throat:     Mouth: Mucous  membranes are moist.  Eyes:     General: No scleral icterus.    Conjunctiva/sclera: Conjunctivae normal.  Cardiovascular:     Rate and Rhythm: Normal rate and regular rhythm.     Heart sounds: No murmur heard.   Pulmonary:     Effort: Pulmonary effort is normal.     Breath sounds: No stridor. No wheezing, rhonchi or rales.  Abdominal:     General: Abdomen is flat.     Palpations: There is no mass.     Tenderness: There is no abdominal tenderness.  There is no guarding.  Musculoskeletal:     Cervical back: Normal range of motion and neck supple.     Right lower leg: No edema.  Lymphadenopathy:     Cervical: No cervical adenopathy.  Skin:    General: Skin is warm.  Neurological:     General: No focal deficit present.     Mental Status: She is alert and oriented to person, place, and time.     Cranial Nerves: Cranial nerves are intact.     Sensory: Sensation is intact.     Motor: Weakness (RUE) present.     Coordination: Coordination is intact.     Gait: Gait is intact.  Psychiatric:        Mood and Affect: Mood normal.        Behavior: Behavior normal.     Lab Results  Component Value Date   WBC 9.2 04/24/2018   HGB 14.0 04/24/2018   HCT 42.0 04/24/2018   PLT 244.0 10/31/2017   GLUCOSE 92 04/24/2018   CHOL 168 04/24/2018   TRIG 54 04/24/2018   HDL 54 04/24/2018   LDLCALC 103 (H) 04/24/2018   ALT 12 04/24/2018   AST 14 04/24/2018   NA 139 04/24/2018   K 4.4 04/24/2018   CL 102 04/24/2018   CREATININE 0.73 04/24/2018   BUN 11 04/24/2018   CO2 25 04/24/2018   TSH 1.030 04/24/2018   HGBA1C 5.5 04/24/2018    MR Cervical Spine Wo Contrast  Result Date: 10/29/2019 CLINICAL DATA:  Neck pain on right side. Abnormal x-ray of cervical spine. Radiculitis of right cervical region. Neck pain for greater than 4 weeks or red flag, positive x-ray. Additional history provided by scanning technologist: Patient reports right arm numbness with certain positions for 2 months, weakness, numbness, tingling. EXAM: MRI CERVICAL SPINE WITHOUT CONTRAST TECHNIQUE: Multiplanar, multisequence MR imaging of the cervical spine was performed. No intravenous contrast was administered. COMPARISON:  Cervical spine radiograph 10/02/2019 FINDINGS: Alignment: Straightening of the expected cervical lordosis. No significant spondylolisthesis. Vertebrae: Vertebral body height is maintained. No marrow edema or suspicious osseous lesion. Cord: No spinal cord  signal abnormality is identified. Posterior Fossa, vertebral arteries, paraspinal tissues: No abnormality identified within included portions of the posterior fossa. Flow voids preserved within the imaged cervical vertebral arteries. Paraspinal soft tissues within normal limits. Disc levels: Mild/moderate C5-C6 disc degeneration. No more than mild disc degeneration at the remaining levels. C2-C3: No significant disc herniation or stenosis. C3-C4: Trace disc bulge. Minimal uncinate and facet hypertrophy (greater on the left). No significant canal or foraminal stenosis. C4-C5: No significant disc herniation or stenosis. C5-C6: Shallow disc bulge. Superimposed right center/foraminal disc protrusion with associated right-sided disc osteophyte ridge/uncinate hypertrophy. No significant canal stenosis. Severe right neural foraminal narrowing. The left neural foramen is patent. C6-C7: No significant disc herniation or stenosis. C7-T1: No significant disc herniation or stenosis. IMPRESSION: Cervical spondylosis as outlined and most notably as  follows. At C5-C6, there is mild/moderate disc degeneration. Shallow disc bulge. Superimposed right center/foraminal disc protrusion with associated right-sided disc osteophyte ridge/uncinate hypertrophy. Severe right neural foraminal narrowing. Correlate for right C6 radiculopathy. No significant spinal canal or left neural foraminal narrowing at this level. Electronically Signed   By: Jackey Loge DO   On: 10/29/2019 07:37    Assessment & Plan:   Ellice was seen today for annual exam.  Diagnoses and all orders for this visit:  Exposure to sexually transmitted disease (STD)- Screening for infections is negative. -     Cancel: GC/Chlamydia Probe Amp; Future -     RPR; Future -     HIV Antibody (routine testing w rflx); Future -     GC/Chlamydia Probe Amp; Future -     GC/Chlamydia Probe Amp -     C. trachomatis/N. gonorrhoeae RNA; Future  Cervical radiculopathy at  C6  Herniation of cervical intervertebral disc with radiculopathy- I recommended that she proceed with surgery as recommended.  Routine general medical examination at a health care facility- Exam completed, no labs are indicated, screening for cervical cancer is up-to-date, patient education was given.  Other orders -     Specimen status report   I am having Enora M. [name] maintain her diclofenac, Ubrelvy, fluocinonide cream, cyclobenzaprine, and phentermine.  No orders of the defined types were placed in this encounter.    Follow-up: Return if symptoms worsen or fail to improve.  Sanda Linger, MD

## 2019-11-06 ENCOUNTER — Ambulatory Visit: Payer: 59 | Admitting: Family Medicine

## 2019-11-06 ENCOUNTER — Encounter: Payer: Self-pay | Admitting: Family Medicine

## 2019-11-06 VITALS — BP 110/78 | HR 87 | Ht 66.0 in | Wt 163.0 lb

## 2019-11-06 DIAGNOSIS — M5010 Cervical disc disorder with radiculopathy, unspecified cervical region: Secondary | ICD-10-CM

## 2019-11-06 DIAGNOSIS — M5412 Radiculopathy, cervical region: Secondary | ICD-10-CM | POA: Diagnosis not present

## 2019-11-06 DIAGNOSIS — M542 Cervicalgia: Secondary | ICD-10-CM | POA: Diagnosis not present

## 2019-11-06 LAB — HIV ANTIBODY (ROUTINE TESTING W REFLEX): HIV 1&2 Ab, 4th Generation: NONREACTIVE

## 2019-11-06 LAB — RPR: RPR Ser Ql: NONREACTIVE

## 2019-11-06 NOTE — Patient Instructions (Signed)
PT [address] will call

## 2019-11-06 NOTE — Progress Notes (Signed)
[facility] [address] [place] [address] Phone: [phone] Subjective:   Christina [name], am serving as a scribe for Dr. Antoine Primas. This visit occurred during the SARS-CoV-2 public health emergency.  Safety protocols were in place, including screening questions prior to the visit, additional usage of staff PPE, and extensive cleaning of exam room while observing appropriate contact time as indicated for disinfecting solutions.   I'm seeing this patient by the request  of:  Etta Grandchild, MD  CC: neck pain follow up   [id number]     Christina [name] is a 39 y.o. female coming in with complaint of cervical spine pain. Patient here for MRI results. Patient states overall feeling like she is making very minimal progress.  Did meet with the neurosurgeons and they have recommended the surgical intervention secondary to the MRI.  Patient would like to know her options.   MRI:   IMPRESSION: Cervical spondylosis as outlined and most notably as follows.  At C5-C6, there is mild/moderate disc degeneration. Shallow disc bulge. Superimposed right center/foraminal disc protrusion with associated right-sided disc osteophyte ridge/uncinate hypertrophy. Severe right neural foraminal narrowing. Correlate for right C6 radiculopathy. No significant spinal canal or left neural foraminal narrowing at this level.  Past Medical History:  Diagnosis Date  . Back pain   . Eczema   . Major depressive disorder   . Migraine   . Normal pregnancy 03/11/2012  . Ovarian cyst   . Postpartum hemorrhage   . SVD (spontaneous vaginal delivery) 03/11/2012   No past surgical history on file. Social History   Socioeconomic History  . Marital status: Married    Spouse name: Sheria Lang  . Number of children: 2  . Years of education: Not on file  . Highest education level: Not on file  Occupational History  . Occupation: cma     Employer: [employer]  Tobacco Use  . Smoking status: Current Every Day Smoker    Packs/day: 0.50    Years: 20.00    Pack years: 10.00    Types: Cigarettes  . Smokeless tobacco: Never Used  Vaping Use  . Vaping Use: Never used  Substance and Sexual Activity  . Alcohol use: Yes    Alcohol/week: 0.0 standard drinks    Comment: occassionally for social functions  . Drug use: No  . Sexual activity: Yes    Partners: Male  Other Topics Concern  . Not on file  Social History Narrative  . Not on file   Social Determinants of Health   Financial Resource Strain:   . Difficulty of Paying Living Expenses:   Food Insecurity:   . Worried About Programme researcher, broadcasting/film/video in the Last Year:   . Barista in the Last Year:   Transportation Needs:   . Freight forwarder (Medical):   Marland Kitchen Lack of Transportation (Non-Medical):   Physical Activity:   . Days of Exercise per Week:   . Minutes of Exercise per Session:   Stress:   . Feeling of Stress :   Social Connections:   . Frequency of Communication with Friends and Family:   . Frequency of Social Gatherings with Friends and Family:   . Attends Religious Services:   . Active Member of Clubs or Organizations:   . Attends Banker Meetings:   Marland Kitchen Marital Status:    Allergies  Allergen Reactions  . Hydrocodone Itching  . Prednisone Other (See Comments)  Hyper, agitated, aggressive   Family History  Problem Relation Age of Onset  . Arthritis Father   . Sudden death Father 39       Sinus Cancer  . Cancer Father        nasal cancer  . Bipolar disorder Mother   . Depression Mother   . Anxiety disorder Mother   . Alcohol abuse Mother   . Drug abuse Mother   . Obesity Mother   . Sudden death Brother        GSW  . Arthritis Maternal Grandfather   . Hyperlipidemia Maternal Grandfather   . Heart disease Maternal Grandfather   . Breast cancer Maternal Grandmother   . Arthritis Maternal Grandmother   . Lung cancer Maternal  Grandmother   . Heart disease Maternal Grandmother   . Hyperlipidemia Paternal Grandmother   . Stroke Paternal Grandfather        Current Outpatient Medications (Analgesics):  .  diclofenac (VOLTAREN) 50 MG EC tablet, Take 1 tablet (50 mg total) by mouth 2 (two) times daily. Marland Kitchen  Ubrogepant (UBRELVY) 50 MG TABS, Take 1 tablet by mouth as needed. (Patient not taking: Reported on 11/05/2019)   Current Outpatient Medications (Other):  .  cyclobenzaprine (FLEXERIL) 10 MG tablet, Take 1 tablet (10 mg total) by mouth 3 (three) times daily as needed for muscle spasms. (Patient not taking: Reported on 11/05/2019) .  fluocinonide cream (LIDEX) 0.05 %, Apply 1 application topically 2 (two) times daily. .  phentermine (ADIPEX-P) 37.5 MG tablet, Take 1 tablet (37.5 mg total) by mouth daily before breakfast.   Reviewed prior external information including notes and imaging from  primary care provider As well as notes that were available from care everywhere and other healthcare systems.  Past medical history, social, surgical and family history all reviewed in electronic medical record.  No pertanent information unless stated regarding to the chief complaint.   Review of Systems:  No headache, visual changes, nausea, vomiting, diarrhea, constipation, dizziness, abdominal pain, skin rash, fevers, chills, night sweats, weight loss, swollen lymph nodes, body aches, joint swelling, chest pain, shortness of breath, mood changes. POSITIVE muscle aches  Objective  Blood pressure 110/78, pulse 87, height 5\' 6"  (1.676 m), weight 163 lb (73.9 kg), last menstrual period 10/19/2019, SpO2 98 %.   General: No apparent distress alert and oriented x3 mood and affect normal, dressed appropriately.  HEENT: Pupils equal, extraocular movements intact  Respiratory: Patient's speak in full sentences and does not appear short of breath  Cardiovascular: No lower extremity edema, non tender, no erythema  Neuro: Cranial  nerves II through XII are intact, neurovascularly intact in all extremities with 2+ DTRs and 2+ pulses.  Gait normal with good balance and coordination.  MSK:  tender with full range of motion and good stability and symmetric strength and tone of shoulders, elbows, wrist, hip, knee and ankles bilaterally.  Patient does have more of a neck loss of lordosis.  Positive Spurling's on the right side.  Patient does have weakness noted in the C6 distribution still noted on the right side compared to left mild improvement from previous exam.  No significant thenar eminence wasting noted   Impression and Recommendations:     The above documentation has been reviewed and is accurate and complete 10/21/2019, DO       Note: This dictation was prepared with Dragon dictation along with smaller phrase technology. Any transcriptional errors that result from this process are unintentional.

## 2019-11-06 NOTE — Assessment & Plan Note (Signed)
Patient's MRI findings is very consistent with pain with herniated disc causing nerve impingement and causing the weakness in the C6 distribution.  Patient is to increase the gabapentin to 600 mg and see how she responds.  If it is helpful we will do a new prescription of the 600 mg capsules.  Patient will be referred to formal physical therapy.  Depending on patient's response we will see how patient does with conservative therapy or we will consider epidural.  In addition to that if necessary will consider the possibility of surgical intervention but I hope this is not necessary.  Follow-up again with 5 to 6 weeks

## 2019-11-07 LAB — SPECIMEN STATUS REPORT

## 2019-11-07 LAB — GC/CHLAMYDIA PROBE AMP

## 2019-11-08 ENCOUNTER — Other Ambulatory Visit: Payer: 59

## 2019-11-08 DIAGNOSIS — Z202 Contact with and (suspected) exposure to infections with a predominantly sexual mode of transmission: Secondary | ICD-10-CM

## 2019-11-09 ENCOUNTER — Encounter: Payer: Self-pay | Admitting: Internal Medicine

## 2019-11-09 ENCOUNTER — Ambulatory Visit: Payer: 59 | Admitting: Family Medicine

## 2019-11-09 LAB — C. TRACHOMATIS/N. GONORRHOEAE RNA
C. trachomatis RNA, TMA: NOT DETECTED
N. gonorrhoeae RNA, TMA: NOT DETECTED

## 2019-11-09 NOTE — Patient Instructions (Signed)

## 2019-11-14 ENCOUNTER — Other Ambulatory Visit: Payer: Self-pay | Admitting: Internal Medicine

## 2019-11-14 DIAGNOSIS — L309 Dermatitis, unspecified: Secondary | ICD-10-CM

## 2019-11-14 MED FILL — FLUOCINONIDE 0.05% CREAM: 0.05 | 20 days supply | Qty: 60 | Fill #0

## 2019-11-14 MED FILL — PHENTERMINE 37.5 MG TABLET: 37.5 | 30 days supply | Qty: 30 | Fill #2

## 2019-12-04 ENCOUNTER — Ambulatory Visit: Payer: 59 | Attending: Family Medicine | Admitting: Physical Therapy

## 2019-12-04 ENCOUNTER — Other Ambulatory Visit: Payer: Self-pay

## 2019-12-04 DIAGNOSIS — M542 Cervicalgia: Secondary | ICD-10-CM | POA: Diagnosis not present

## 2019-12-04 DIAGNOSIS — R252 Cramp and spasm: Secondary | ICD-10-CM | POA: Insufficient documentation

## 2019-12-04 DIAGNOSIS — R293 Abnormal posture: Secondary | ICD-10-CM | POA: Insufficient documentation

## 2019-12-04 DIAGNOSIS — M6281 Muscle weakness (generalized): Secondary | ICD-10-CM | POA: Insufficient documentation

## 2019-12-04 NOTE — Patient Instructions (Signed)
     ost   Posture Tips DO: - stand tall and erect - keep chin tucked in - keep head and shoulders in alignment - check posture regularly in mirror or large window - pull head back against headrest in car seat;  Change your position often.  Sit with lumbar support. DON'T: - slouch or slump while watching TV or reading - sit, stand or lie in one position  for too long;  Sitting is especially hard on the spine so if you sit at a desk/use the computer, then stand up often!   Copyright  VHI. All rights reserved.  Posture - Standing   Good posture is important. Avoid slouching and forward head thrust. Maintain curve in low back and align ears over shoul- ders, hips over ankles.  Pull your belly button in toward your back bone. Stand with even weight on heels an toes, ribs lifted up and chin down.   Copyright  VHI. All rights reserved.  Posture - Sitting   Sit upright, head facing forward. Try using a roll to support lower back. Keep shoulders relaxed, and avoid rounded back. Keep hips level with knees. Avoid crossing legs for long periods. dont cross legs,  Sit on sit bones not tail bone   Copyright  VHI. All rights reserved.    Garen Lah, PT, ATRIC Certified Exercise Expert for the Aging Adult  12/04/19 2:27 PM Phone: [phone] Fax: [phone]

## 2019-12-04 NOTE — Therapy (Signed)
[facility] Outpatient Rehabilitation [facility] [address] [place], [place], [address] Phone: [phone]   Fax:  [phone]  Physical Therapy Evaluation  Patient Details  Name: Christina [name] MRN: [id number] Date of Birth: [birth year] Referring Provider (PT): Terrilee Files DO   Encounter Date: 12/04/2019   PT End of Session - 12/04/19 1340    Visit Number 1    Number of Visits 13    Date for PT Re-Evaluation 01/15/20    Authorization Type [facility]    PT Start Time 1330    PT Stop Time 1415    PT Time Calculation (min) 45 min    Activity Tolerance Patient tolerated treatment well    Behavior During Therapy [facility] for tasks assessed/performed           Past Medical History:  Diagnosis Date  . Back pain   . Eczema   . Major depressive disorder   . Migraine   . Normal pregnancy 03/11/2012  . Ovarian cyst   . Postpartum hemorrhage   . SVD (spontaneous vaginal delivery) 03/11/2012    No past surgical history on file.  There were no vitals filed for this visit.    Subjective Assessment - 12/04/19 1335    Subjective I have had neck to shoulder pain for about 3 months of pain.  I just had an MRI August 4th, (HNP C-5/C-6). I have intermittent numbness/tingling. As I sit with my neck straight, i have pain and tingling but I turn my neck to the left and tilt I get better.  I didnt have anything or accident or falls. that I know of . I am concerned because I sometimes drop things with my hands. so I was concerned and wanted to see what I can do to get better. I did have a couple of trips and fell over my door threshold.   We are doing a lot of work in our yard and use a chain saw and take care of chickens  I need to be able to use a shovel without haveing pain    Pertinent History DDD of low back. class 1 obesity,  See medical chart   HNP with cervical radiculopathy    Limitations Sitting    How long can you sit comfortably? I can sti for an hour as  long as I can tilt my head    How long can you stand comfortably? no limitiation in but neck needs to be right postion    Diagnostic tests MRI    Patient Stated Goals Try to get numbness to go away so I can not have surgery. Learn HEP    Currently in Pain? Yes    Pain Score 4    at worst it is 9/10   Pain Location Neck    Pain Descriptors / Indicators Numbness;Tingling;Throbbing;Burning    Pain Type Acute pain    Pain Radiating Towards Rt UE to to Thumb and forefinger  C 6 distribuation    Pain Onset More than a month ago   3 months   Pain Frequency Intermittent    Aggravating Factors  holding neck straight              [facility] PT Assessment - 12/04/19 0001      Assessment   Medical Diagnosis cervical spine pain   C-6 radiculopathy on RT    Referring Provider (PT) Terrilee Files DO    Onset Date/Surgical Date 09/05/19    Hand Dominance Right  Next MD Visit next visit 6 weeks of PT    Prior Therapy none      Precautions   Precautions None      Restrictions   Weight Bearing Restrictions No      Balance Screen   Has the patient fallen in the past 6 months Yes    How many times? 2   stepping out of door and tripped   Has the patient had a decrease in activity level because of a fear of falling?  No    Is the patient reluctant to leave their home because of a fear of falling?  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      Cognition   Overall Cognitive Status Within Functional Limits for tasks assessed      Observation/Other Assessments   Focus on Therapeutic Outcomes (FOTO)  Intake 36% limtation 44% predicted 30%      Sensation   Additional Comments numbness and tingling to thumb and forefinger on Rt UE       Posture/Postural Control   Posture/Postural Control Postural limitations    Postural Limitations Rounded Shoulders;Forward head;Anterior pelvic tilt    Posture Comments was obesity level 1 but now at 164  lb      AROM   Overall AROM  Deficits    Cervical Flexion 43   feels stretch pain between shld blades   Cervical Extension 50    Cervical - Right Side Bend 28    Cervical - Left Side Bend 30    Cervical - Right Rotation 45    Cervical - Left Rotation 55      Strength   Overall Strength Deficits    Right Shoulder Flexion 5/5    Right Shoulder Extension 5/5    Right Shoulder ABduction 5/5    Right Shoulder Internal Rotation 4+/5    Right Shoulder External Rotation 4-/5    Left Shoulder Flexion 5/5    Left Shoulder Extension 5/5    Left Shoulder ABduction 5/5    Left Shoulder Internal Rotation 5/5    Left Shoulder External Rotation 5/5    Right Hand Grip (lbs) 24    Left Hand Grip (lbs) 69      Palpation   Palpation comment tenderniss over RT C-5 to C-7  spinous process cervial  tightened Upper trap RT      Spurling's   Findings Positive    Side Right      Distraction Test   Findngs Positive    side Right              Access Code: [email or web address]: [email or web address]: 09/14/2021Prepared by: Wayland DenisLawrie BeardsleyExercises  Supine Deep Neck Flexor Training - 2 x daily - 7 x weekly - 10 reps - 10 hold  Supine Deep Neck Flexor Training - Repetitions - 2 x daily - 7 x weekly - 10 reps - 3 hold  Seated Levator Scapulae Stretch - 2 x daily - 7 x weekly - 1 sets - 3 reps - 30 hold  Seated Upper Trapezius Stretch - 2 x daily - 7 x weekly - 1 sets - 3 reps - 30 hold  Shoulder External Rotation and Scapular Retraction with Resistance - 2 x  daily - 7 x weekly - 10 reps - 3 sets  Scapular Retraction with Resistance - 2 x daily - 7 x weekly - 10 reps - 3 sets  Scapular Retraction with Resistance Advanced - 2 x daily - 7 x weekly - 10 reps - 3  sets         Objective measurements completed on examination: See above findings.       [facility] Adult PT Treatment/Exercise - 12/04/19 0001      Self-Care   Self-Care Posture    Posture initial sitting and standing posture       Exercises   Exercises Neck                  PT Education - 12/04/19 1445    Education Details POC Explanation of finding.  inital Posture sititing and standing.  Intiial HEP, initial FOTO report for goal setting.    Person(s) Educated Patient    Methods Explanation;Demonstration;Tactile cues;Verbal cues;Handout    Comprehension Verbalized understanding;Returned demonstration            PT Short Term Goals - 12/04/19 1403      PT SHORT TERM GOAL #1   Title Pt will be independent with Intial HEP    Baseline limited knowledge    Time 3    Period Weeks    Status New    Target Date 12/25/19      PT SHORT TERM GOAL #2   Title Pt will increase Hand held dynamometer by 10 lb    Baseline Rt was 24 # LT was 60#    Time 3    Period Weeks    Status New    Target Date 12/25/19             PT Long Term Goals - 12/04/19 1414      PT LONG TERM GOAL #1   Title Demonstrate and verbalize techniques to reduce the risk of re-injury including: lifting, posture, body mechanics.    Baseline Pt cannot sit with neck upright in chair due to increasing symptoms of radiculopathy in RT UE    Time 6    Period Weeks    Status New    Target Date 01/15/20      PT LONG TERM GOAL #2   Title Pt will need to ba able to lift equipment maximum 30# overhead to stock work station.    Baseline Pt unable to lift above head safely with appropriate strength and safety    Time 6    Period Weeks    Status New    Target Date 01/15/20      PT LONG TERM GOAL #3   Title Pt will be able to use a shovel for yard work and a chain saw without exacerbating R UE symptomes    Baseline Pt is unable to use a shovel to help yard and raise chickens in their back yard     Time 6    Period Weeks    Status New    Target Date 01/15/20      PT LONG TERM GOAL #4   Title FOTO will improve from  44% limitation  to   30%  indicating improved functional mobility.    Baseline Eval limted 44%    Time 6    Period Weeks    Status New    Target Date 01/15/20      PT LONG TERM GOAL #5   Title Pt will not  wake due to pain  while turning in bed while sleeping at night    Baseline Pt currently wakes up 3 x during the night due to pain    Time 6    Period Weeks    Status New    Target Date 01/15/20                  Plan - 12/04/19 1412    Clinical Impression Statement Pt is a 38 year old female with c/o of increased neck pain with radiating pain into  RT UE c 6 distribution for about 3 months since mid June.  No incident. Pt presents with signs and symptoms compatible with cervical radiculopathy due to disc derangement. Pt would benefit from skilled PT for 2 times a week for 6 weeks to address impariments and functional limitations and return to pain-free PLOF.    Personal Factors and Comorbidities Comorbidity 1    Examination-Activity Limitations Carry;Lift;Reach Overhead    Examination-Participation Restrictions Yard Work;Occupation    Stability/Clinical Decision Making Stable/Uncomplicated    Clinical Decision Making Low    Rehab Potential Excellent    PT Frequency 2x / week    PT Duration 6 weeks    PT Treatment/Interventions ADLs/Self Care Home Management;Cryotherapy;Electrical Stimulation;Iontophoresis 4mg /ml Dexamethasone;Moist Heat;Traction;Ultrasound;Therapeutic exercise;Therapeutic activities;Neuromuscular re-education;Patient/family education;Manual techniques;Passive range of motion;Dry needling;Taping;Spinal Manipulations    PT Next Visit Plan reveiw FOTO report and reinforce HEP.  TPDN to RTtrap and cervical paraspinal.   UE limb testing for possible addition of nerve glides for cervical radial nerve    PT Home Exercise Plan [email or web address]  upper trap   and levator stretch.  deep neck flexor stretch.  deep neck flexor rep with chin tuck.  , scapular retraction, shld ER and scapular retraction with arm ext all with resistance    Consulted and Agree with Plan of Care Patient           Patient will benefit from skilled therapeutic intervention in order to improve the following deficits and impairments:  Pain, Postural dysfunction, Decreased range of motion, Decreased strength, Hypermobility, Increased muscle spasms, Impaired UE functional use, Improper body mechanics  Visit Diagnosis: Cervicalgia  Muscle weakness (generalized)  Abnormal posture  Cramp and spasm     Problem List Patient Active Problem List   Diagnosis Date Noted  . Exposure to sexually transmitted disease (STD) 11/05/2019  . Herniation of cervical intervertebral disc with radiculopathy 10/29/2019  . Overweight (BMI 25.0-29.9) 08/15/2019  . Facet arthritis, degenerative, L5-S1 level, lumbosacral spine 04/13/2019  . Class 1 obesity due to excess calories without serious comorbidity with body mass index (BMI) of 31.0 to 31.9 in adult 03/07/2019  . Vitamin D deficiency 09/05/2018  . Allergic rhinosinusitis 01/30/2018  . Female stress incontinence 11/22/2017  . DDD (degenerative disc disease), lumbar 10/31/2017  . Varicose veins of right lower extremity with complications 09/13/2017  . Eczema 08/17/2017  . Middle insomnia 04/20/2016  . Depression with somatization 11/22/2014  . Routine general medical examination at a health care facility 08/29/2014  . Tobacco use disorder 02/23/2014   14/07/2013, PT, [employer] Certified Exercise Expert for the Aging Adult  12/04/19 2:49 PM Phone: [phone] Fax: [phone]  [facility] [address] [place], [place], [place] Phone: [phone]   Fax:  [phone]  Name: Christina [name] MRN: Christina [name] Date of Birth: 05-06-1980

## 2019-12-06 ENCOUNTER — Ambulatory Visit (INDEPENDENT_AMBULATORY_CARE_PROVIDER_SITE_OTHER): Payer: 59

## 2019-12-06 ENCOUNTER — Other Ambulatory Visit: Payer: Self-pay

## 2019-12-06 DIAGNOSIS — Z23 Encounter for immunization: Secondary | ICD-10-CM | POA: Diagnosis not present

## 2019-12-07 ENCOUNTER — Other Ambulatory Visit: Payer: 59

## 2019-12-13 DIAGNOSIS — Z01419 Encounter for gynecological examination (general) (routine) without abnormal findings: Secondary | ICD-10-CM | POA: Diagnosis not present

## 2019-12-13 DIAGNOSIS — Z1151 Encounter for screening for human papillomavirus (HPV): Secondary | ICD-10-CM | POA: Diagnosis not present

## 2019-12-13 DIAGNOSIS — Z124 Encounter for screening for malignant neoplasm of cervix: Secondary | ICD-10-CM | POA: Diagnosis not present

## 2019-12-13 DIAGNOSIS — Z1389 Encounter for screening for other disorder: Secondary | ICD-10-CM | POA: Diagnosis not present

## 2019-12-13 DIAGNOSIS — N6452 Nipple discharge: Secondary | ICD-10-CM | POA: Diagnosis not present

## 2019-12-13 DIAGNOSIS — Z6827 Body mass index (BMI) 27.0-27.9, adult: Secondary | ICD-10-CM | POA: Diagnosis not present

## 2019-12-13 DIAGNOSIS — Z130 Encounter for screening for diseases of the blood and blood-forming organs and certain disorders involving the immune mechanism: Secondary | ICD-10-CM | POA: Diagnosis not present

## 2019-12-13 LAB — HM PAP SMEAR

## 2019-12-14 DIAGNOSIS — Z1151 Encounter for screening for human papillomavirus (HPV): Secondary | ICD-10-CM | POA: Diagnosis not present

## 2019-12-14 DIAGNOSIS — Z124 Encounter for screening for malignant neoplasm of cervix: Secondary | ICD-10-CM | POA: Diagnosis not present

## 2019-12-14 LAB — HM PAP SMEAR

## 2019-12-25 ENCOUNTER — Other Ambulatory Visit: Payer: Self-pay

## 2019-12-25 ENCOUNTER — Ambulatory Visit: Payer: 59 | Attending: Family Medicine | Admitting: Physical Therapy

## 2019-12-25 DIAGNOSIS — M6281 Muscle weakness (generalized): Secondary | ICD-10-CM | POA: Insufficient documentation

## 2019-12-25 DIAGNOSIS — R252 Cramp and spasm: Secondary | ICD-10-CM | POA: Diagnosis not present

## 2019-12-25 DIAGNOSIS — M542 Cervicalgia: Secondary | ICD-10-CM | POA: Insufficient documentation

## 2019-12-25 DIAGNOSIS — R293 Abnormal posture: Secondary | ICD-10-CM | POA: Insufficient documentation

## 2019-12-26 ENCOUNTER — Encounter: Payer: Self-pay | Admitting: Physical Therapy

## 2019-12-26 NOTE — Therapy (Signed)
[facility] Outpatient Rehabilitation [facility] [address] [place], [place], [address] Phone: [phone]   Fax:  [phone]  Physical Therapy Treatment  Patient Details  Name: Christina [name] MRN: [id number] Date of Birth: May 22, 1980 Referring Provider (PT): Terrilee Files DO   Encounter Date: 12/25/2019   PT End of Session - 12/26/19 1310    Visit Number 2    Number of Visits 13    Date for PT Re-Evaluation 01/15/20    Authorization Type [facility]    PT Start Time 1630    PT Stop Time 1712    PT Time Calculation (min) 42 min    Activity Tolerance Patient tolerated treatment well    Behavior During Therapy [facility] for tasks assessed/performed           Past Medical History:  Diagnosis Date  . Back pain   . Eczema   . Major depressive disorder   . Migraine   . Normal pregnancy 03/11/2012  . Ovarian cyst   . Postpartum hemorrhage   . SVD (spontaneous vaginal delivery) 03/11/2012    History reviewed. No pertinent surgical history.  There were no vitals filed for this visit.   Subjective Assessment - 12/25/19 1634    Subjective Patient reports having continued numbness/tingling in R upper extremity with difficulty holding and grasping objects with R hand. She states she can still grip with R hand but has a noticeable difference in strength compared to before. Pt states she has been working hard to maintain ideal posture while completing homework. Pt complains of numbness throughout entire right upper extremity.    Currently in Pain? Yes    Pain Score 4     Pain Location Neck    Pain Orientation Lower    Pain Descriptors / Indicators Numbness;Tingling;Throbbing;Burning    Pain Type Acute pain    Pain Radiating Towards Entire right upper extremity to thumb and forefinger along C6 distribution    Pain Onset More than a month ago    Pain Frequency Intermittent    Aggravating Factors  holding neck straight                              [facility] Adult PT Treatment/Exercise - 12/26/19 0001      Exercises   Exercises Neck;Shoulder      Neck Exercises: Seated   Other Seated Exercise bilateral er x20 red; horizontal abdcution x20 red; felcion with abdcution x20 red       Shoulder Exercises: Seated   External Rotation AROM;Strengthening;Both;10 reps;Theraband      Shoulder Exercises: Standing   Extension Strengthening;Both;Theraband;20 reps    Theraband Level (Shoulder Extension) Level 3 (Green)    Row Strengthening;Both;Theraband;20 reps    Theraband Level (Shoulder Row) Level 3 (Green)      Manual Therapy   Manual Therapy Myofascial release;Soft tissue mobilization    Soft tissue mobilization B upper traps and posterior neck musculature    Myofascial Release Suboccipital release with pt in supine                  PT Education - 12/25/19 1640    Education Details POC, pt education regarding use and potential benefits of dry nedling, sitting and standing posture, HEP    Person(s) Educated Patient    Methods Explanation;Demonstration;Tactile cues;Verbal cues    Comprehension Verbalized understanding;Returned demonstration            PT Short Term  Goals - 12/04/19 1403      PT SHORT TERM GOAL #1   Title Pt will be independent with Intial HEP    Baseline limited knowledge    Time 3    Period Weeks    Status New    Target Date 12/25/19      PT SHORT TERM GOAL #2   Title Pt will increase Hand held dynamometer by 10 lb    Baseline Rt was 24 # LT was 60#    Time 3    Period Weeks    Status New    Target Date 12/25/19             PT Long Term Goals - 12/04/19 1414      PT LONG TERM GOAL #1   Title Demonstrate and verbalize techniques to reduce the risk of re-injury including: lifting, posture, body mechanics.    Baseline Pt cannot sit with neck upright in chair due to increasing symptoms of radiculopathy in RT UE    Time 6    Period Weeks    Status New     Target Date 01/15/20      PT LONG TERM GOAL #2   Title Pt will need to ba able to lift equipment maximum 30# overhead to stock work station.    Baseline Pt unable to lift above head safely with appropriate strength and safety    Time 6    Period Weeks    Status New    Target Date 01/15/20      PT LONG TERM GOAL #3   Title Pt will be able to use a shovel for yard work and a chain saw without exacerbating R UE symptomes    Baseline Pt is unable to use a shovel to help yard and raise chickens in their back yard    Time 6    Period Weeks    Status New    Target Date 01/15/20      PT LONG TERM GOAL #4   Title FOTO will improve from  44% limitation  to   30%  indicating improved functional mobility.    Baseline Eval limted 44%    Time 6    Period Weeks    Status New    Target Date 01/15/20      PT LONG TERM GOAL #5   Title Pt will not wake due to pain  while turning in bed while sleeping at night    Baseline Pt currently wakes up 3 x during the night due to pain    Time 6    Period Weeks    Status New    Target Date 01/15/20                 Plan - 12/25/19 1653    Clinical Impression Statement Pt tolerated R upper trap dry needling well with minimal discomfort along myofascial trigger points but no complaints of significant pain. Pt expressed brief sensation of numbness in R upper extremity during suboccipital release that was quickly eased. Pt then experienced persistent numbness/tingling in R upper extremity towards end of treatment session but was able to perform interventions without complaints of pain.    Personal Factors and Comorbidities Comorbidity 1    Examination-Activity Limitations Carry;Lift;Reach Overhead    Examination-Participation Restrictions Yard Work;Occupation    PT Frequency 2x / week    PT Duration 6 weeks    PT Treatment/Interventions ADLs/Self Care Home Management;Cryotherapy;Electrical Stimulation;Iontophoresis 4mg /ml Dexamethasone;Moist  Heat;Traction;Ultrasound;Therapeutic  exercise;Therapeutic activities;Neuromuscular re-education;Patient/family education;Manual techniques;Passive range of motion;Dry needling;Taping;Spinal Manipulations    PT Home Exercise Plan [id number]  upper trap  and levator stretch.  deep neck flexor stretch.  deep neck flexor rep with chin tuck.  , scapular retraction, shld ER and scapular retraction with arm ext all with resistance    Consulted and Agree with Plan of Care Patient           Patient will benefit from skilled therapeutic intervention in order to improve the following deficits and impairments:  Pain, Postural dysfunction, Decreased range of motion, Decreased strength, Hypermobility, Increased muscle spasms, Impaired UE functional use, Improper body mechanics  Visit Diagnosis: Cervicalgia  Muscle weakness (generalized)  Abnormal posture  Cramp and spasm     Problem List Patient Active Problem List   Diagnosis Date Noted  . Exposure to sexually transmitted disease (STD) 11/05/2019  . Herniation of cervical intervertebral disc with radiculopathy 10/29/2019  . Overweight (BMI 25.0-29.9) 08/15/2019  . Facet arthritis, degenerative, L5-S1 level, lumbosacral spine 04/13/2019  . Class 1 obesity due to excess calories without serious comorbidity with body mass index (BMI) of 31.0 to 31.9 in adult 03/07/2019  . Vitamin D deficiency 09/05/2018  . Allergic rhinosinusitis 01/30/2018  . Female stress incontinence 11/22/2017  . DDD (degenerative disc disease), lumbar 10/31/2017  . Varicose veins of right lower extremity with complications 09/13/2017  . Eczema 08/17/2017  . Middle insomnia 04/20/2016  . Depression with somatization 11/22/2014  . Routine general medical examination at a health care facility 08/29/2014  . Tobacco use disorder 02/23/2014    Dessie Coma 12/26/2019, 1:13 PM  [facility] [address] [place],  [place], [address] Phone: [phone]   Fax:  [phone]  Name: Christina [name] MRN: [id number] Date of Birth: Oct 25, 1980

## 2019-12-27 ENCOUNTER — Other Ambulatory Visit: Payer: Self-pay

## 2019-12-27 ENCOUNTER — Ambulatory Visit: Payer: 59 | Admitting: Physical Therapy

## 2019-12-27 ENCOUNTER — Encounter: Payer: Self-pay | Admitting: Physical Therapy

## 2019-12-27 DIAGNOSIS — R293 Abnormal posture: Secondary | ICD-10-CM | POA: Diagnosis not present

## 2019-12-27 DIAGNOSIS — M542 Cervicalgia: Secondary | ICD-10-CM

## 2019-12-27 DIAGNOSIS — M6281 Muscle weakness (generalized): Secondary | ICD-10-CM

## 2019-12-27 DIAGNOSIS — R252 Cramp and spasm: Secondary | ICD-10-CM

## 2019-12-27 NOTE — Therapy (Signed)
[place], [place], [address] Phone: [phone]   Fax:  [phone]  Physical Therapy Treatment  Patient Details  Name: Christina [name] MRN: [id number] Date of Birth: 05/23/1980 Referring Provider (PT): Charlann Boxer DO   Encounter Date: 12/27/2019   PT End of Session - 12/27/19 1938    Visit Number 3    Number of Visits 13    Date for PT Re-Evaluation 01/15/20    Authorization Type [facility]    PT Start Time 3790    PT Stop Time 2409    PT Time Calculation (min) 44 min    Activity Tolerance Other (comment)   limited response directional preference exercises to ease right UE symptoms   Behavior During Therapy [facility] for tasks assessed/performed           Past Medical History:  Diagnosis Date  . Back pain   . Eczema   . Major depressive disorder   . Migraine   . Normal pregnancy 03/11/2012  . Ovarian cyst   . Postpartum hemorrhage   . SVD (spontaneous vaginal delivery) 03/11/2012    History reviewed. No pertinent surgical history.  There were no vitals filed for this visit.   Subjective Assessment - 12/27/19 1932    Subjective Pt. continues with right UE radicular symptoms with sharp pain into lateral shoulder region and continued parasthesias distally intor right arm and hand to digits 1-3. She sees Dr. Arnoldo Morale on Tuesday for follow up.    Currently in Pain? Yes    Pain Score 5     Pain Location Neck    Pain Descriptors / Indicators Numbness;Tingling;Sharp    Pain Type Acute pain    Pain Radiating Towards right shoulder/right UE distally to fingers (digits 1-3)    Pain Onset More than a month ago    Pain Frequency Intermittent              [facility] PT Assessment - 12/27/19 0001      Strength   Right Hand Grip (lbs) 31                         [facility] Adult PT Treatment/Exercise - 12/27/19 0001      Neck Exercises: Seated   Other Seated Exercise seated  retraction to cervical flexion nod-brief ease of symptoms but then pain returned, also attempted retraction with left cervical rotation x 10 reps no significant ease in symptoms      Neck Exercises: Supine   Neck Retraction Limitations attempted retraction in supine with therapist assistance but held due to peripheralization of right UE symptoms    Other Supine Exercise attempted retraction with extension but held due to peripheralization of right UE symptoms      Manual Therapy   Manual Therapy Joint mobilization;Neural Stretch    Joint Mobilization Thoracic PAs grade I-III    Myofascial Release cervical manual traction and suboccipital release    Neural Stretch right median and radial nerve glides x 15 reps ea.                  PT Education - 12/27/19 1938    Education Details spine anatomy with symptom etiology, POC    Person(s) Educated Patient    Methods Explanation;Demonstration    Comprehension Verbalized understanding            PT Short Term Goals - 12/27/19 1943      PT  SHORT TERM GOAL #1   Title Pt will be independent with Intial HEP    Baseline met for initial HEP    Time 3    Period Weeks    Status Achieved      PT SHORT TERM GOAL #2   Title Pt will increase Hand held dynamometer by 10 lb    Baseline right 31 lbs. 12/27/19 (24 lbs. at eval)    Time 3    Period Weeks    Status On-going             PT Long Term Goals - 12/04/19 1414      PT LONG TERM GOAL #1   Title Demonstrate and verbalize techniques to reduce the risk of re-injury including: lifting, posture, body mechanics.    Baseline Pt cannot sit with neck upright in chair due to increasing symptoms of radiculopathy in RT UE    Time 6    Period Weeks    Status New    Target Date 01/15/20      PT LONG TERM GOAL #2   Title Pt will need to ba able to lift equipment maximum 30# overhead to stock work station.    Baseline Pt unable to lift above head safely with appropriate strength and  safety    Time 6    Period Weeks    Status New    Target Date 01/15/20      PT LONG TERM GOAL #3   Title Pt will be able to use a shovel for yard work and a chain saw without exacerbating R UE symptomes    Baseline Pt is unable to use a shovel to help yard and raise chickens in their back yard    Time 6    Period Weeks    Status New    Target Date 01/15/20      PT LONG TERM GOAL #4   Title FOTO will improve from  44% limitation  to   30%  indicating improved functional mobility.    Baseline Eval limted 44%    Time 6    Period Weeks    Status New    Target Date 01/15/20      PT LONG TERM GOAL #5   Title Pt will not wake due to pain  while turning in bed while sleeping at night    Baseline Pt currently wakes up 3 x during the night due to pain    Time 6    Period Weeks    Status New    Target Date 01/15/20                 Plan - 12/27/19 1940    Clinical Impression Statement Temporary ease of right UE radicular symptoms with manual traction but symptoms returned upon release of traction. Multiple attempts at directional preference exercises initially with retractions and trial extension but symptoms peripheralized with both. Mild ease with flexion which could be correlate with underlying stenosis from disc bulge but symptoms returned after this as well. Plan await status at next MD follow up for further plan of care.    Personal Factors and Comorbidities Comorbidity 1    Examination-Activity Limitations Carry;Lift;Reach Overhead    Examination-Participation Restrictions Yard Work;Occupation    Stability/Clinical Decision Making Stable/Uncomplicated    Clinical Decision Making Low    Rehab Potential Fair    PT Frequency 2x / week    PT Duration 6 weeks    PT Treatment/Interventions ADLs/Self Care  Home Management;Cryotherapy;Electrical Stimulation;Iontophoresis 77m/ml Dexamethasone;Moist Heat;Traction;Ultrasound;Therapeutic exercise;Therapeutic activities;Neuromuscular  re-education;Patient/family education;Manual techniques;Passive range of motion;Dry needling;Taping;Spinal Manipulations    PT Next Visit Plan continue traction, nerve glides (radial, median prn), directional preference?, further dry needling prn    PT Home Exercise Plan [id number]  upper trap  and levator stretch.  deep neck flexor stretch.  deep neck flexor rep with chin tuck.  , scapular retraction, shld ER and scapular retraction with arm ext all with resistance    Consulted and Agree with Plan of Care Patient           Patient will benefit from skilled therapeutic intervention in order to improve the following deficits and impairments:  Pain, Postural dysfunction, Decreased range of motion, Decreased strength, Hypermobility, Increased muscle spasms, Impaired UE functional use, Improper body mechanics  Visit Diagnosis: Cervicalgia  Muscle weakness (generalized)  Abnormal posture  Cramp and spasm     Problem List Patient Active Problem List   Diagnosis Date Noted  . Exposure to sexually transmitted disease (STD) 11/05/2019  . Herniation of cervical intervertebral disc with radiculopathy 10/29/2019  . Overweight (BMI 25.0-29.9) 08/15/2019  . Facet arthritis, degenerative, L5-S1 level, lumbosacral spine 04/13/2019  . Class 1 obesity due to excess calories without serious comorbidity with body mass index (BMI) of 31.0 to 31.9 in adult 03/07/2019  . Vitamin D deficiency 09/05/2018  . Allergic rhinosinusitis 01/30/2018  . Female stress incontinence 11/22/2017  . DDD (degenerative disc disease), lumbar 10/31/2017  . Varicose veins of right lower extremity with complications 097/06/4923 . Eczema 08/17/2017  . Middle insomnia 04/20/2016  . Depression with somatization 11/22/2014  . Routine general medical examination at a health care facility 08/29/2014  . Tobacco use disorder 02/23/2014    CBeaulah Dinning PT, DPT 12/27/19 7:45 PM  [facility] [address]Phone: [phone]  Fax:  [phone] Name: SGarnell BegemanMRN: [id number] of Birth: [birth year]

## 2020-01-01 DIAGNOSIS — M5020 Other cervical disc displacement, unspecified cervical region: Secondary | ICD-10-CM | POA: Diagnosis not present

## 2020-01-11 ENCOUNTER — Other Ambulatory Visit: Payer: Self-pay | Admitting: Gynecology

## 2020-01-11 ENCOUNTER — Ambulatory Visit
Admission: RE | Admit: 2020-01-11 | Discharge: 2020-01-11 | Disposition: A | Discharge status: Home / Self Care | Payer: 59 | Source: Ambulatory Visit | Attending: Gynecology | Admitting: Gynecology

## 2020-01-11 ENCOUNTER — Other Ambulatory Visit: Payer: Self-pay

## 2020-01-11 DIAGNOSIS — N6310 Unspecified lump in the right breast, unspecified quadrant: Secondary | ICD-10-CM

## 2020-01-11 DIAGNOSIS — N6312 Unspecified lump in the right breast, upper inner quadrant: Secondary | ICD-10-CM | POA: Diagnosis not present

## 2020-01-15 ENCOUNTER — Ambulatory Visit: Payer: 59 | Admitting: Physical Therapy

## 2020-01-21 ENCOUNTER — Other Ambulatory Visit: Payer: Self-pay | Admitting: Internal Medicine

## 2020-01-21 ENCOUNTER — Other Ambulatory Visit: Payer: Self-pay

## 2020-01-21 ENCOUNTER — Ambulatory Visit: Payer: 59 | Admitting: Internal Medicine

## 2020-01-21 ENCOUNTER — Encounter: Payer: Self-pay | Admitting: Internal Medicine

## 2020-01-21 VITALS — BP 112/76 | HR 76 | Temp 98.5°F | Resp 16 | Ht 66.0 in | Wt 166.0 lb

## 2020-01-21 DIAGNOSIS — F5081 Binge eating disorder: Secondary | ICD-10-CM | POA: Insufficient documentation

## 2020-01-21 MED ORDER — LISDEXAMFETAMINE DIMESYLATE 20 MG PO CAPS: 20.0000 mg | ORAL_CAPSULE | Freq: Every day | ORAL | 0 refills | Status: DC

## 2020-01-21 MED FILL — VYVANSE 20 MG CAPSULE: 20 | 30 days supply | Qty: 30 | Fill #0

## 2020-01-21 NOTE — Progress Notes (Signed)
Subjective:  Patient ID: Christina [name], female    DOB: [birth year]  Age: 39 y.o. MRN: [id number]  CC: Obesity  This visit occurred during the SARS-CoV-2 public health emergency.  Safety protocols were in place, including screening questions prior to the visit, additional usage of staff PPE, and extensive cleaning of exam room while observing appropriate contact time as indicated for disinfecting solutions.    HPI Christina [name] presents for f/up - She continues to struggle with her efforts to lose weight. She has worked on her lifestyle modifications and is taking phentermine. She will lose weight and then regain the weight. She feels like her main issue now is binge eating. She will not eat much during the day and then will consume a large amount of calories later in the day. She would like to try something different.  Outpatient Medications Prior to Visit  Medication Sig Dispense Refill  . cyclobenzaprine (FLEXERIL) 10 MG tablet Take 1 tablet (10 mg total) by mouth 3 (three) times daily as needed for muscle spasms. 30 tablet 0  . diclofenac (VOLTAREN) 50 MG EC tablet Take 1 tablet (50 mg total) by mouth 2 (two) times daily. 180 tablet 1  . fluocinonide cream (LIDEX) 0.05 % APPLY 1 APPLICATION TOPICALLY 2 (TWO) TIMES DAILY. 60 g 2  . Ubrogepant (UBRELVY) 50 MG TABS Take 1 tablet by mouth as needed. 10 tablet 5  . gabapentin (NEURONTIN) 100 MG capsule Take 100 mg by mouth at bedtime.    . phentermine (ADIPEX-P) 37.5 MG tablet Take 1 tablet (37.5 mg total) by mouth daily before breakfast. 30 tablet 2   No facility-administered medications prior to visit.    ROS Review of Systems  Constitutional: Positive for unexpected weight change. Negative for diaphoresis and fatigue.  HENT: Negative.   Respiratory: Negative for cough, chest tightness, shortness of breath and wheezing.   Cardiovascular: Negative for chest pain, palpitations and leg swelling.   Gastrointestinal: Negative for abdominal pain, constipation, diarrhea, nausea and vomiting.  Genitourinary: Negative.  Negative for difficulty urinating.  Musculoskeletal: Positive for neck pain. Negative for back pain.  Skin: Negative for color change and pallor.  Neurological: Negative.  Negative for dizziness, weakness and light-headedness.  Hematological: Negative for adenopathy. Does not bruise/bleed easily.  Psychiatric/Behavioral: Negative.     Objective:  BP 112/76   Pulse 76   Temp 98.5 F (36.9 C) (Oral)   Resp 16   Ht 5\' 6"  (1.676 m)   Wt 166 lb (75.3 kg)   LMP 01/01/2020 (Exact Date)   SpO2 99%   BMI 26.79 kg/m   BP Readings from Last 3 Encounters:  01/21/20 112/76  11/06/19 110/78  11/05/19 116/78    Wt Readings from Last 3 Encounters:  01/21/20 166 lb (75.3 kg)  11/06/19 163 lb (73.9 kg)  11/05/19 163 lb 9.6 oz (74.2 kg)    Physical Exam Vitals reviewed.  Constitutional:      Appearance: Normal appearance.  HENT:     Nose: Nose normal.     Mouth/Throat:     Mouth: Mucous membranes are moist.  Eyes:     General: No scleral icterus.    Conjunctiva/sclera: Conjunctivae normal.  Cardiovascular:     Rate and Rhythm: Normal rate and regular rhythm.     Heart sounds: No murmur heard.   Pulmonary:     Effort: Pulmonary effort is normal.     Breath sounds: No stridor. No wheezing, rhonchi or rales.  Abdominal:  General: Abdomen is flat.     Palpations: There is no mass.     Tenderness: There is no abdominal tenderness. There is no guarding.  Musculoskeletal:        General: Normal range of motion.     Cervical back: Neck supple.     Right lower leg: No edema.     Left lower leg: No edema.  Lymphadenopathy:     Cervical: No cervical adenopathy.  Skin:    General: Skin is warm and dry.  Neurological:     General: No focal deficit present.     Mental Status: She is alert.  Psychiatric:        Mood and Affect: Mood normal.        Behavior:  Behavior normal.        Thought Content: Thought content normal.        Judgment: Judgment normal.     Lab Results  Component Value Date   WBC 9.2 04/24/2018   HGB 14.0 04/24/2018   HCT 42.0 04/24/2018   PLT 244.0 10/31/2017   GLUCOSE 92 04/24/2018   CHOL 168 04/24/2018   TRIG 54 04/24/2018   HDL 54 04/24/2018   LDLCALC 103 (H) 04/24/2018   ALT 12 04/24/2018   AST 14 04/24/2018   NA 139 04/24/2018   K 4.4 04/24/2018   CL 102 04/24/2018   CREATININE 0.73 04/24/2018   BUN 11 04/24/2018   CO2 25 04/24/2018   TSH 1.030 04/24/2018   HGBA1C 5.5 04/24/2018    US BREAST LTD UNI RIGHT INC AXILLA  Result Date: 01/11/2020 CLINICAL DATA:  First six-month follow-up for probably benign mass in the RIGHT breast. EXAM: ULTRASOUND OF THE RIGHT BREAST COMPARISON:  Previous exam(s). FINDINGS: Targeted ultrasound is performed, showing a hypoechoic oval parallel mass in the retroareolar region of the RIGHT breast 1 o'clock location measuring 0.4 x 0.3 x 0.2 centimeters. IMPRESSION: Stable appearance of probably benign mass in the retroareolar region of the RIGHT breast. RECOMMENDATION: Recommend bilateral diagnostic mammogram and RIGHT breast ultrasound in 6 months. I have discussed the findings and recommendations with the patient. If applicable, a reminder letter will be sent to the patient regarding the next appointment. BI-RADS CATEGORY  3: Probably benign. Electronically Signed   By: Norva Pavlov M.D.   On: 01/11/2020 11:31    Assessment & Plan:   Ines was seen today for obesity.  Diagnoses and all orders for this visit:  Binge eating disorder-Will treat for binge eating disorder with lisdexamphetamine. Will start at a low dose of 20 mg a day and over the next few months we will titrate up to get to the therapeutic dose of 60 mg a day. -     lisdexamfetamine (VYVANSE) 20 MG capsule; Take 1 capsule (20 mg total) by mouth daily.   I have discontinued Cierra M. Cairrikier  Davidson's phentermine and gabapentin. I am also having her start on lisdexamfetamine. Additionally, I am having her maintain her diclofenac, Ubrelvy, cyclobenzaprine, and fluocinonide cream.  Meds ordered this encounter  Medications  . lisdexamfetamine (VYVANSE) 20 MG capsule    Sig: Take 1 capsule (20 mg total) by mouth daily.    Dispense:  30 capsule    Refill:  0     Follow-up: Return if symptoms worsen or fail to improve.  Sanda Linger, MD

## 2020-01-21 NOTE — Patient Instructions (Signed)
Amphetamine extended-release oral disintegrating tablet What is this medicine? AMPHETAMINE (am FET a meen) is used to treat attention-deficit hyperactivity disorder (ADHD). This medicine may be used for other purposes; ask your health care provider or pharmacist if you have questions. COMMON BRAND NAME(S): Adzenys XR What should I tell my health care provider before I take this medicine? They need to know if you have any of these conditions:  circulation problems in fingers and toes  heart disease or a heart defect  high blood pressure  history of a drug or alcohol abuse problem  history of stroke  kidney disease  mental illness  suicidal thoughts, plans, or attempt; a previous suicide attempt by you or a family member  Tourette's syndrome  an unusual or allergic reaction to amphetamine, other medicines, foods, dyes, or preservatives  pregnant or trying to get pregnant  breast-feeding How should I use this medicine? Take this medicine by mouth. Follow the directions on the prescription label. Leave the tablet in the sealed blister pack until you are ready to take it. With dry hands, open the blister and gently remove the tablet. Do not try to push the tablet through the foil backing. If the tablet breaks or crumbles, throw it away and take a new tablet out of the blister pack. Place the tablet in the mouth and allow it to dissolve, and then swallow. Do not cut, crush or chew this medicine. This medicine will be taken once daily, in the morning. You can take it with or without food. If it upsets your stomach, take it with food. Take your medicine at regular intervals. Do not take it more often than directed. Do not stop taking except on your doctor's advice. A special MedGuide will be given to you by the pharmacist with each prescription and refill. Be sure to read this information carefully each time. Talk to your pediatrician regarding the use of this medicine in children. While  this drug may be prescribed for children as young as 75 years of age for selected conditions, precautions do apply. Overdosage: If you think you have taken too much of this medicine contact a poison control center or emergency room at once. NOTE: This medicine is only for you. Do not share this medicine with others. What if I miss a dose? If you miss a dose, take it as soon as you can. If it is almost time for your next dose, take only that dose. Do not take double or extra doses. What may interact with this medicine? Do not take this medicine with any of the following medications:  alcohol  MAOIS like Carbex, Eldepryl, Marplan, Nardil, and Parnate  other stimulant medicines for attention disorders This medicine may also interact with the following medications:  acetazolamide  ammonium chloride  antacids  ascorbic acid  certain medicines for depression, anxiety, or psychotic disturbances  certain medicines for stomach problems like cimetidine, famotidine, omeprazole, lansoprazole  glutamic acid  guanethidine  methenamine; sodium acid phosphate  reserpine  sodium bicarbonate This list may not describe all possible interactions. Give your health care provider a list of all the medicines, herbs, non-prescription drugs, or dietary supplements you use. Also tell them if you smoke, drink alcohol, or use illegal drugs. Some items may interact with your medicine. What should I watch for while using this medicine? Visit your doctor or health care professional for regular checks on your progress. This prescription requires that you follow special procedures with your doctor and pharmacy. You will  need to have a new written prescription from your doctor every time you need a refill. This medicine may affect your concentration, or hide signs of tiredness. Until you know how this drug affects you, do not drive, ride a bicycle, use machinery, or do anything that needs mental alertness. Tell  your doctor or health care professional if this medicine loses its effects, or if you feel you need to take more than the prescribed amount. Do not change the dosage without talking to your doctor or health care professional. For males, contact your doctor or health care professional right away if you have an erection that lasts longer than 4 hours or if it becomes painful. This may be a sign of a serious problem and must be treated right away to prevent permanent damage. Decreased appetite is a common side effect when starting this medicine. Eating small, frequent meals or snacks can help. Talk to your doctor if you continue to have poor eating habits. Height and weight growth of a child taking this medication will be monitored closely. Do not take this medicine close to bedtime. It may prevent you from sleeping. Tell your doctor or healthcare professional right away if you notice unexplained wounds on your fingers and toes while taking this medicine. You should also tell your healthcare provider if you experience numbness or pain, changes in the skin color, or sensitivity to temperature in your fingers or toes. What side effects may I notice from receiving this medicine? Side effects that you should report to your doctor or health care professional as soon as possible:  allergic reactions like skin rash, itching or hives, swelling of the face, lips, or tongue  changes in emotions or moods  fingers or toes feel numb, cool, painful  hallucination, loss of contact with reality  high blood pressure  males: prolonged or painful erection  signs and symptoms of a dangerous change in heartbeat or heart rhythm like chest pain; dizziness; fast or irregular heartbeat; palpitations; feeling faint or lightheaded, falls; breathing problems  signs and symptoms of a stroke like changes in vision; confusion; trouble speaking or understanding; severe headaches; sudden numbness or weakness of the face, arm, or  leg; trouble walking; dizziness; loss of balance or coordination  suicidal thoughts or other mood changes  uncontrollable head, mouth, neck, arm, or leg movements Side effects that usually do not require medical attention (report to your doctor or health care professional if they continue or are bothersome):  anxious  diarrhea  dizziness  dry mouth  fever  headache  loss of appetite  nausea, vomiting  stomach pain  trouble sleeping  weight loss This list may not describe all possible side effects. Call your doctor for medical advice about side effects. You may report side effects to FDA at 1-800-FDA-1088. Where should I keep my medicine? Keep out of the reach of children. This medicine can be abused. Keep your medicine in a safe place to protect it from theft. Do not share this medicine with anyone. Selling or giving away this medicine is dangerous and against the law. Store at room temperature between 20 and 25 degrees C (68 and 77 degrees F). Keep this medicine in the blister packaging until you are ready to take or give it. Store the blister packages in the hard plastic travel case provided. Throw away any unused medicine after the expiration date. This medicine may cause accidental overdose and death if it taken by other adults, children, or pets. Discard unused medicine  and used packaging carefully. Follow the directions in the MedGuide. Do not use the medicine after the expiration date. NOTE: This sheet is a summary. It may not cover all possible information. If you have questions about this medicine, talk to your doctor, pharmacist, or health care provider.  2020 Elsevier/Gold Standard (2015-04-28 15:29:28)

## 2020-01-24 ENCOUNTER — Other Ambulatory Visit (HOSPITAL_COMMUNITY): Payer: Self-pay | Admitting: Student

## 2020-01-24 DIAGNOSIS — M50122 Cervical disc disorder at C5-C6 level with radiculopathy: Secondary | ICD-10-CM | POA: Diagnosis not present

## 2020-01-24 DIAGNOSIS — M5020 Other cervical disc displacement, unspecified cervical region: Secondary | ICD-10-CM | POA: Diagnosis not present

## 2020-01-24 MED FILL — CYCLOBENZAPRINE HCL 10 MG T: 10 | 10 days supply | Qty: 30 | Fill #0

## 2020-01-24 MED FILL — OXYCODONE-APAP 5-325MG: 5-325 | 5 days supply | Qty: 30 | Fill #0

## 2020-02-07 NOTE — Therapy (Signed)
[facility] [place], [place], [address] Phone: [phone]   Fax:  [phone]  Physical Therapy Treatment/Discharge  Patient Details  Name: Christina [name] MRN: [id number] Date of Birth: 1980/12/30 Referring Provider (PT): Charlann Boxer DO   Encounter Date: 12/27/2019    Past Medical History:  Diagnosis Date  . Back pain   . Eczema   . Major depressive disorder   . Migraine   . Normal pregnancy 03/11/2012  . Ovarian cyst   . Postpartum hemorrhage   . SVD (spontaneous vaginal delivery) 03/11/2012    History reviewed. No pertinent surgical history.  There were no vitals filed for this visit.                                PT Short Term Goals - 12/27/19 1943      PT SHORT TERM GOAL #1   Title Pt will be independent with Intial HEP    Baseline met for initial HEP    Time 3    Period Weeks    Status Achieved      PT SHORT TERM GOAL #2   Title Pt will increase Hand held dynamometer by 10 lb    Baseline right 31 lbs. 12/27/19 (24 lbs. at eval)    Time 3    Period Weeks    Status On-going             PT Long Term Goals - 12/04/19 1414      PT LONG TERM GOAL #1   Title Demonstrate and verbalize techniques to reduce the risk of re-injury including: lifting, posture, body mechanics.    Baseline Pt cannot sit with neck upright in chair due to increasing symptoms of radiculopathy in RT UE    Time 6    Period Weeks    Status New    Target Date 01/15/20      PT LONG TERM GOAL #2   Title Pt will need to ba able to lift equipment maximum 30# overhead to stock work station.    Baseline Pt unable to lift above head safely with appropriate strength and safety    Time 6    Period Weeks    Status New    Target Date 01/15/20      PT LONG TERM GOAL #3   Title Pt will be able to use a shovel for yard work and a chain saw without exacerbating R UE symptomes     Baseline Pt is unable to use a shovel to help yard and raise chickens in their back yard    Time 6    Period Weeks    Status New    Target Date 01/15/20      PT LONG TERM GOAL #4   Title FOTO will improve from  44% limitation  to   30%  indicating improved functional mobility.    Baseline Eval limted 44%    Time 6    Period Weeks    Status New    Target Date 01/15/20      PT LONG TERM GOAL #5   Title Pt will not wake due to pain  while turning in bed while sleeping at night    Baseline Pt currently wakes up 3 x during the night due to pain    Time 6    Period Weeks    Status New  Target Date 01/15/20                  Patient will benefit from skilled therapeutic intervention in order to improve the following deficits and impairments:  Pain, Postural dysfunction, Decreased range of motion, Decreased strength, Hypermobility, Increased muscle spasms, Impaired UE functional use, Improper body mechanics  Visit Diagnosis: Cervicalgia  Muscle weakness (generalized)  Abnormal posture  Cramp and spasm     Problem List Patient Active Problem List   Diagnosis Date Noted  . Binge eating disorder 01/21/2020  . Exposure to sexually transmitted disease (STD) 11/05/2019  . Herniation of cervical intervertebral disc with radiculopathy 10/29/2019  . Overweight (BMI 25.0-29.9) 08/15/2019  . Facet arthritis, degenerative, L5-S1 level, lumbosacral spine 04/13/2019  . Class 1 obesity due to excess calories without serious comorbidity with body mass index (BMI) of 31.0 to 31.9 in adult 03/07/2019  . Vitamin D deficiency 09/05/2018  . Allergic rhinosinusitis 01/30/2018  . Female stress incontinence 11/22/2017  . DDD (degenerative disc disease), lumbar 10/31/2017  . Varicose veins of right lower extremity with complications 79/55/8316  . Eczema 08/17/2017  . Middle insomnia 04/20/2016  . Depression with somatization 11/22/2014  . Routine general medical examination at a  health care facility 08/29/2014  . Tobacco use disorder 02/23/2014       PHYSICAL THERAPY DISCHARGE SUMMARY  Visits from Start of Care: 3  Current functional level related to goals / functional outcomes: Patient did not return for further therapy visits after last session 12/27/19. At the time she was still having significant cervical radicular symptoms and plan was to follow up with Dr. Arnoldo Morale regarding status. No further visits scheduled/planned at this time.   Remaining deficits: Cervical radiculopathy with associated pain   Education / Equipment: HEP Plan: Patient agrees to discharge.  Patient goals were not met. Patient is being discharged due to not returning since the last visit.  ?????           Beaulah Dinning, PT, DPT 02/07/20 2:43 PM        [facility] [address] [place], [place], [address] Phone: [phone]   Fax:  [phone]  Name: Christina [name] MRN: [id number] Date of Birth: [birth year]

## 2020-02-22 DIAGNOSIS — M5020 Other cervical disc displacement, unspecified cervical region: Secondary | ICD-10-CM | POA: Diagnosis not present

## 2020-03-26 ENCOUNTER — Encounter: Payer: Self-pay | Admitting: Internal Medicine

## 2020-03-26 ENCOUNTER — Other Ambulatory Visit: Payer: Self-pay | Admitting: Internal Medicine

## 2020-03-26 DIAGNOSIS — M5136 Other intervertebral disc degeneration, lumbar region: Secondary | ICD-10-CM

## 2020-03-26 DIAGNOSIS — F5081 Binge eating disorder: Secondary | ICD-10-CM

## 2020-03-26 MED ORDER — DICLOFENAC SODIUM 50 MG PO TBEC: 50.0000 mg | DELAYED_RELEASE_TABLET | Freq: Two times a day (BID) | ORAL | 1 refills | Status: DC

## 2020-03-26 MED ORDER — LISDEXAMFETAMINE DIMESYLATE 20 MG PO CAPS: 20.0000 mg | ORAL_CAPSULE | Freq: Every day | ORAL | 0 refills | Status: DC

## 2020-03-26 MED FILL — VYVANSE 20 MG CAPSULE: 20 | 30 days supply | Qty: 30 | Fill #0

## 2020-03-26 MED FILL — DICLOFENAC SOD EC 50 MG TAB: 50 | 90 days supply | Qty: 180 | Fill #0

## 2020-04-28 ENCOUNTER — Encounter: Payer: Self-pay | Admitting: Internal Medicine

## 2020-05-12 ENCOUNTER — Other Ambulatory Visit: Payer: Self-pay | Admitting: Family

## 2020-05-12 ENCOUNTER — Ambulatory Visit: Payer: 59 | Admitting: Family

## 2020-05-12 ENCOUNTER — Other Ambulatory Visit: Payer: Self-pay

## 2020-05-12 ENCOUNTER — Encounter: Payer: Self-pay | Admitting: Family

## 2020-05-12 VITALS — BP 124/82 | HR 86 | Temp 98.2°F | Resp 18 | Ht 66.0 in | Wt 173.6 lb

## 2020-05-12 DIAGNOSIS — R309 Painful micturition, unspecified: Secondary | ICD-10-CM

## 2020-05-12 LAB — POCT URINALYSIS DIPSTICK
Bilirubin, UA: NEGATIVE
Blood, UA: POSITIVE
Glucose, UA: NEGATIVE
Ketones, UA: NEGATIVE
Leukocytes, UA: NEGATIVE
Nitrite, UA: NEGATIVE
Protein, UA: NEGATIVE
Spec Grav, UA: >=1.03 — AB (ref 1.010–1.025)
Urobilinogen, UA: 1 E.U./dL
pH, UA: 6 (ref 5.0–8.0)

## 2020-05-12 MED ORDER — SULFAMETHOXAZOLE-TRIMETHOPRIM 800-160 MG PO TABS: 1.0000 | ORAL_TABLET | Freq: Two times a day (BID) | ORAL | 0 refills | Status: DC

## 2020-05-12 MED ORDER — FLUCONAZOLE 150 MG PO TABS: 150.0000 mg | ORAL_TABLET | Freq: Once | ORAL | 0 refills | Status: DC

## 2020-05-12 MED FILL — FLUCONAZOLE 150 MG TABS: 150 | 3 days supply | Qty: 2 | Fill #0

## 2020-05-12 MED FILL — SULFAMETHOXAZOLE-TMP DS TAB: 800-160 | 5 days supply | Qty: 10 | Fill #0

## 2020-05-12 NOTE — Progress Notes (Signed)
Christina [name] is a 40 y.o. female with the following history as recorded in Epic[employer]:  Patient Active Problem List   Diagnosis Date Noted  . Binge eating disorder 01/21/2020  . Exposure to sexually transmitted disease (STD) 11/05/2019  . Herniation of cervical intervertebral disc with radiculopathy 10/29/2019  . Overweight (BMI 25.0-29.9) 08/15/2019  . Facet arthritis, degenerative, L5-S1 level, lumbosacral spine 04/13/2019  . Class 1 obesity due to excess calories without serious comorbidity with body mass index (BMI) of 31.0 to 31.9 in adult 03/07/2019  . Vitamin D deficiency 09/05/2018  . Allergic rhinosinusitis 01/30/2018  . Female stress incontinence 11/22/2017  . DDD (degenerative disc disease), lumbar 10/31/2017  . Varicose veins of right lower extremity with complications 09/13/2017  . Eczema 08/17/2017  . Middle insomnia 04/20/2016  . Depression with somatization 11/22/2014  . Routine general medical examination at a health care facility 08/29/2014  . Tobacco use disorder 02/23/2014    Current Outpatient Medications  Medication Sig Dispense Refill  . cyclobenzaprine (FLEXERIL) 10 MG tablet Take 1 tablet (10 mg total) by mouth 3 (three) times daily as needed for muscle spasms. 30 tablet 0  . diclofenac (VOLTAREN) 50 MG EC tablet Take 1 tablet (50 mg total) by mouth 2 (two) times daily. 180 tablet 1  . fluconazole (DIFLUCAN) 150 MG tablet Take 1 tablet (150 mg total) by mouth once for 1 dose. Repeat after 72 hours 2 tablet 0  . fluocinonide cream (LIDEX) 0.05 % APPLY 1 APPLICATION TOPICALLY 2 (TWO) TIMES DAILY. 60 g 2  . sulfamethoxazole-trimethoprim (BACTRIM DS) 800-160 MG tablet Take 1 tablet by mouth 2 (two) times daily. 10 tablet 0  . Ubrogepant (UBRELVY) 50 MG TABS Take 1 tablet by mouth as needed. 10 tablet 5  . lisdexamfetamine (VYVANSE) 20 MG capsule Take 1 capsule (20 mg total) by mouth daily. 30 capsule 0   No current facility-administered  medications for this visit.    Allergies: Hydrocodone and Prednisone  Past Medical History:  Diagnosis Date  . Back pain   . Eczema   . Major depressive disorder   . Migraine   . Normal pregnancy 03/11/2012  . Ovarian cyst   . Postpartum hemorrhage   . SVD (spontaneous vaginal delivery) 03/11/2012    History reviewed. No pertinent surgical history.  Family History  Problem Relation Age of Onset  . Arthritis Father   . Sudden death Father 52       Sinus Cancer  . Cancer Father        nasal cancer  . Bipolar disorder Mother   . Depression Mother   . Anxiety disorder Mother   . Alcohol abuse Mother   . Drug abuse Mother   . Obesity Mother   . Sudden death Brother        GSW  . Arthritis Maternal Grandfather   . Hyperlipidemia Maternal Grandfather   . Heart disease Maternal Grandfather   . Breast cancer Maternal Grandmother   . Arthritis Maternal Grandmother   . Lung cancer Maternal Grandmother   . Heart disease Maternal Grandmother   . Hyperlipidemia Paternal Grandmother   . Stroke Paternal Grandfather     Social History   Tobacco Use  . Smoking status: Current Every Day Smoker    Packs/day: 0.50    Years: 20.00    Pack years: 10.00    Types: Cigarettes  . Smokeless tobacco: Never Used  Substance Use Topics  . Alcohol use: Yes    Alcohol/week: 0.0  standard drinks    Comment: occassionally for social functions    Subjective:   Patient felt like she had a UTI last week- was having pain and discomfort with urination; symptoms have improved in past 24 hours with increased water intake; still having urinary frequency/ pelvic pressure;   LMP- now  Objective:  Vitals:   05/12/20 1347  BP: 124/82  Pulse: 86  Resp: 18  Temp: 98.2 F (36.8 C)  TempSrc: Oral  SpO2: 98%  Weight: 173 lb 9.6 oz (78.7 kg)  Height: 5\' 6"  (1.676 m)    General: Well developed, well nourished, in no acute distress  Skin : Warm and dry.  Head: Normocephalic and atraumatic  Lungs:  Respirations unlabored; clear to auscultation bilaterally without wheeze, rales, rhonchi  Neurologic: Alert and oriented; speech intact; face symmetrical; moves all extremities well; CNII-XII intact without focal deficit   Assessment:  1. Pain with urination     Plan:  Check U/A, urine culture; start Bactrim DS bid x 5 days; increase water intake; follow- up worse, no better.   This visit occurred during the SARS-CoV-2 public health emergency.  Safety protocols were in place, including screening questions prior to the visit, additional usage of staff PPE, and extensive cleaning of exam room while observing appropriate contact time as indicated for disinfecting solutions.      No follow-ups on file.  Orders Placed This Encounter  Procedures  . Urine Culture    Standing Status:   Future    Number of Occurrences:   1    Standing Expiration Date:   05/12/2021  . POCT Urinalysis Dipstick    Requested Prescriptions   Signed Prescriptions Disp Refills  . sulfamethoxazole-trimethoprim (BACTRIM DS) 800-160 MG tablet 10 tablet 0    Sig: Take 1 tablet by mouth 2 (two) times daily.  . fluconazole (DIFLUCAN) 150 MG tablet 2 tablet 0    Sig: Take 1 tablet (150 mg total) by mouth once for 1 dose. Repeat after 72 hours

## 2020-05-13 LAB — URINE CULTURE: Result:: NO GROWTH

## 2020-05-14 ENCOUNTER — Encounter: Payer: Self-pay | Admitting: Family

## 2020-05-14 ENCOUNTER — Other Ambulatory Visit: Payer: Self-pay | Admitting: Internal Medicine

## 2020-05-14 DIAGNOSIS — F5081 Binge eating disorder: Secondary | ICD-10-CM

## 2020-05-14 MED ORDER — LISDEXAMFETAMINE DIMESYLATE 20 MG PO CAPS: 20.0000 mg | ORAL_CAPSULE | Freq: Every day | ORAL | 0 refills | Status: DC

## 2020-05-14 MED FILL — VYVANSE 20 MG CAPSULE: 20 | 30 days supply | Qty: 30 | Fill #0

## 2020-05-21 MED FILL — FLUOCINONIDE 0.05% CREAM: 0.05 | 20 days supply | Qty: 60 | Fill #1

## 2020-05-22 MED FILL — VYVANSE 20 MG CAPSULE: 20 | 30 days supply | Qty: 30 | Fill #0

## 2020-06-10 DIAGNOSIS — M5020 Other cervical disc displacement, unspecified cervical region: Secondary | ICD-10-CM | POA: Diagnosis not present

## 2020-06-13 ENCOUNTER — Other Ambulatory Visit (HOSPITAL_BASED_OUTPATIENT_CLINIC_OR_DEPARTMENT_OTHER): Payer: Self-pay

## 2020-07-11 ENCOUNTER — Other Ambulatory Visit: Payer: Self-pay | Admitting: Obstetrics and Gynecology

## 2020-07-14 ENCOUNTER — Ambulatory Visit
Admission: RE | Admit: 2020-07-14 | Discharge: 2020-07-14 | Disposition: A | Discharge status: Home / Self Care | Payer: 59 | Source: Ambulatory Visit | Attending: Gynecology | Admitting: Gynecology

## 2020-07-14 ENCOUNTER — Other Ambulatory Visit: Payer: Self-pay

## 2020-07-14 ENCOUNTER — Other Ambulatory Visit: Payer: Self-pay | Admitting: Gynecology

## 2020-07-14 DIAGNOSIS — R928 Other abnormal and inconclusive findings on diagnostic imaging of breast: Secondary | ICD-10-CM | POA: Diagnosis not present

## 2020-07-14 DIAGNOSIS — N6310 Unspecified lump in the right breast, unspecified quadrant: Secondary | ICD-10-CM

## 2020-07-14 DIAGNOSIS — N6011 Diffuse cystic mastopathy of right breast: Secondary | ICD-10-CM | POA: Diagnosis not present

## 2020-08-09 ENCOUNTER — Encounter: Payer: Self-pay | Admitting: Internal Medicine

## 2020-08-11 ENCOUNTER — Other Ambulatory Visit (HOSPITAL_COMMUNITY): Payer: Self-pay

## 2020-08-11 ENCOUNTER — Other Ambulatory Visit: Payer: Self-pay | Admitting: Internal Medicine

## 2020-08-11 DIAGNOSIS — F5081 Binge eating disorder: Secondary | ICD-10-CM

## 2020-08-11 MED ORDER — LISDEXAMFETAMINE DIMESYLATE 20 MG PO CAPS
ORAL_CAPSULE | Freq: Every day | ORAL | 0 refills | Status: DC
  Filled 2020-08-11: qty 30, 30d supply, fill #0

## 2020-09-17 ENCOUNTER — Other Ambulatory Visit: Payer: Self-pay | Admitting: Internal Medicine

## 2020-09-17 ENCOUNTER — Telehealth: Payer: Self-pay

## 2020-09-17 ENCOUNTER — Other Ambulatory Visit (HOSPITAL_COMMUNITY): Payer: Self-pay

## 2020-09-17 DIAGNOSIS — F5081 Binge eating disorder: Secondary | ICD-10-CM

## 2020-09-17 MED ORDER — LISDEXAMFETAMINE DIMESYLATE 20 MG PO CAPS
ORAL_CAPSULE | Freq: Every day | ORAL | 0 refills | Status: DC
  Filled 2020-09-17: qty 30, 30d supply, fill #0

## 2020-09-17 NOTE — Telephone Encounter (Signed)
Pt called requesting a refill of Vyvanse. Please advise.

## 2020-09-18 ENCOUNTER — Other Ambulatory Visit (HOSPITAL_COMMUNITY): Payer: Self-pay

## 2020-10-06 ENCOUNTER — Other Ambulatory Visit (HOSPITAL_COMMUNITY): Payer: Self-pay

## 2020-10-06 MED FILL — Diclofenac Sodium Tab Delayed Release 50 MG: ORAL | 90 days supply | Qty: 180 | Fill #0 | Status: AC

## 2020-11-03 ENCOUNTER — Other Ambulatory Visit: Payer: Self-pay | Admitting: Internal Medicine

## 2020-11-03 ENCOUNTER — Other Ambulatory Visit (HOSPITAL_COMMUNITY): Payer: Self-pay

## 2020-11-03 ENCOUNTER — Encounter: Payer: Self-pay | Admitting: Internal Medicine

## 2020-11-03 DIAGNOSIS — F5081 Binge eating disorder: Secondary | ICD-10-CM

## 2020-11-03 MED ORDER — LISDEXAMFETAMINE DIMESYLATE 20 MG PO CAPS
ORAL_CAPSULE | Freq: Every day | ORAL | 0 refills | Status: DC
  Filled 2020-11-03: qty 30, 30d supply, fill #0

## 2020-11-10 ENCOUNTER — Ambulatory Visit (INDEPENDENT_AMBULATORY_CARE_PROVIDER_SITE_OTHER): Payer: 59 | Admitting: Internal Medicine

## 2020-11-10 ENCOUNTER — Encounter: Payer: Self-pay | Admitting: Internal Medicine

## 2020-11-10 ENCOUNTER — Ambulatory Visit (INDEPENDENT_AMBULATORY_CARE_PROVIDER_SITE_OTHER): Payer: 59

## 2020-11-10 ENCOUNTER — Encounter: Payer: 59 | Admitting: Internal Medicine

## 2020-11-10 ENCOUNTER — Other Ambulatory Visit: Payer: Self-pay

## 2020-11-10 VITALS — BP 100/60 | HR 88 | Temp 98.6°F | Resp 16 | Ht 65.5 in | Wt 174.0 lb

## 2020-11-10 DIAGNOSIS — Z043 Encounter for examination and observation following other accident: Secondary | ICD-10-CM | POA: Diagnosis not present

## 2020-11-10 DIAGNOSIS — Z0001 Encounter for general adult medical examination with abnormal findings: Secondary | ICD-10-CM | POA: Insufficient documentation

## 2020-11-10 DIAGNOSIS — R10816 Epigastric abdominal tenderness: Secondary | ICD-10-CM | POA: Insufficient documentation

## 2020-11-10 DIAGNOSIS — K5900 Constipation, unspecified: Secondary | ICD-10-CM | POA: Diagnosis not present

## 2020-11-10 LAB — CBC WITH DIFFERENTIAL/PLATELET
Basophils Absolute: 0 10*3/uL (ref 0.0–0.1)
Basophils Relative: 0.5 % (ref 0.0–3.0)
Eosinophils Absolute: 0.2 10*3/uL (ref 0.0–0.7)
Eosinophils Relative: 2.2 % (ref 0.0–5.0)
HCT: 41.2 % (ref 36.0–46.0)
Hemoglobin: 14 g/dL (ref 12.0–15.0)
Lymphocytes Relative: 26.8 % (ref 12.0–46.0)
Lymphs Abs: 2.1 10*3/uL (ref 0.7–4.0)
MCHC: 34 g/dL (ref 30.0–36.0)
MCV: 90.6 fl (ref 78.0–100.0)
Monocytes Absolute: 0.6 10*3/uL (ref 0.1–1.0)
Monocytes Relative: 7 % (ref 3.0–12.0)
Neutro Abs: 5 10*3/uL (ref 1.4–7.7)
Neutrophils Relative %: 63.5 % (ref 43.0–77.0)
Platelets: 220 10*3/uL (ref 150.0–400.0)
RBC: 4.54 Mil/uL (ref 3.87–5.11)
RDW: 13.3 % (ref 11.5–15.5)
WBC: 7.9 10*3/uL (ref 4.0–10.5)

## 2020-11-10 LAB — HEPATIC FUNCTION PANEL
ALT: 11 U/L (ref 0–35)
AST: 17 U/L (ref 0–37)
Albumin: 4.6 g/dL (ref 3.5–5.2)
Alkaline Phosphatase: 43 U/L (ref 39–117)
Bilirubin, Direct: 0.1 mg/dL (ref 0.0–0.3)
Total Bilirubin: 0.7 mg/dL (ref 0.2–1.2)
Total Protein: 7.3 g/dL (ref 6.0–8.3)

## 2020-11-10 LAB — BASIC METABOLIC PANEL
BUN: 8 mg/dL (ref 6–23)
CO2: 25 mEq/L (ref 19–32)
Calcium: 9.4 mg/dL (ref 8.4–10.5)
Chloride: 104 mEq/L (ref 96–112)
Creatinine, Ser: 0.77 mg/dL (ref 0.40–1.20)
GFR: 96.91 mL/min (ref >60.00)
Glucose, Bld: 87 mg/dL (ref 70–99)
Potassium: 3.9 mEq/L (ref 3.5–5.1)
Sodium: 138 mEq/L (ref 135–145)

## 2020-11-10 LAB — AMYLASE: Amylase: 27 U/L (ref 27–131)

## 2020-11-10 LAB — HCG, QUANTITATIVE, PREGNANCY: Quantitative HCG: <0.6 m[IU]/mL

## 2020-11-10 LAB — LIPASE: Lipase: 9 U/L — ABNORMAL LOW (ref 11.0–59.0)

## 2020-11-10 NOTE — Progress Notes (Signed)
Subjective:  Patient ID: Christina [name], female    DOB: [birth year]  Age: 40 y.o. MRN: [id number]  CC: Annual Exam and Abdominal Pain  This visit occurred during the SARS-CoV-2 public health emergency.  Safety protocols were in place, including screening questions prior to the visit, additional usage of staff PPE, and extensive cleaning of exam room while observing appropriate contact time as indicated for disinfecting solutions.    HPI Christina [name] presents for a CPX and f/up -  She complains of a 1 day hx epigastric abd pain that radiates bilaterally. Her appetite is good.  She denies nausea, vomiting, dysuria, hematuria, diarrhea, constipation, bright red blood per rectum.  Outpatient Medications Prior to Visit  Medication Sig Dispense Refill   diclofenac (VOLTAREN) 50 MG EC tablet TAKE 1 TABLET BY MOUTH 2 TIMES DAILY 180 tablet 1   fluocinonide cream (LIDEX) 0.05 % APPLY TO THE AFFECTED AREA(S) TWICE DAILY 60 g 2   lisdexamfetamine (VYVANSE) 20 MG capsule TAKE 1 CAPSULE BY MOUTH ONCE A DAY 30 capsule 0   Ubrogepant (UBRELVY) 50 MG TABS Take 1 tablet by mouth as needed. 10 tablet 5   No facility-administered medications prior to visit.    ROS Review of Systems  Constitutional:  Negative for appetite change, diaphoresis and fatigue.  HENT: Negative.  Negative for trouble swallowing.   Respiratory:  Negative for cough, chest tightness, shortness of breath and wheezing.   Cardiovascular:  Negative for chest pain, palpitations and leg swelling.  Gastrointestinal:  Positive for abdominal pain. Negative for blood in stool, constipation, diarrhea, nausea and vomiting.  Genitourinary: Negative.  Negative for difficulty urinating, dysuria and hematuria.  Musculoskeletal: Negative.  Negative for arthralgias and myalgias.  Skin: Negative.   Neurological: Negative.  Negative for dizziness, weakness and light-headedness.  Hematological:  Negative for  adenopathy. Does not bruise/bleed easily.  Psychiatric/Behavioral: Negative.     Objective:  BP 100/60 (BP Location: Right Arm, Patient Position: Sitting, Cuff Size: Large)   Pulse 88   Temp 98.6 F (37 C) (Oral)   Resp 16   Ht 5' 5.5" (1.664 m)   Wt 174 lb (78.9 kg)   LMP 11/07/2020   SpO2 97%   BMI 28.51 kg/m   BP Readings from Last 3 Encounters:  11/10/20 100/60  05/12/20 124/82  01/21/20 112/76    Wt Readings from Last 3 Encounters:  11/10/20 174 lb (78.9 kg)  05/12/20 173 lb 9.6 oz (78.7 kg)  01/21/20 166 lb (75.3 kg)    Physical Exam Vitals reviewed.  Constitutional:      Appearance: She is not ill-appearing.  HENT:     Nose: Nose normal.     Mouth/Throat:     Mouth: Mucous membranes are moist.  Eyes:     Conjunctiva/sclera: Conjunctivae normal.  Cardiovascular:     Rate and Rhythm: Normal rate and regular rhythm.     Heart sounds: No murmur heard. Pulmonary:     Effort: Pulmonary effort is normal.     Breath sounds: No stridor. No wheezing, rhonchi or rales.  Abdominal:     General: Abdomen is flat. Bowel sounds are normal. There is no distension or abdominal bruit.     Palpations: There is no mass.     Tenderness: There is abdominal tenderness in the epigastric area. There is no guarding or rebound.     Hernia: No hernia is present.  Musculoskeletal:        General: Normal range of  motion.     Cervical back: Neck supple.     Right lower leg: No edema.     Left lower leg: No edema.  Lymphadenopathy:     Cervical: No cervical adenopathy.  Skin:    General: Skin is warm and dry.  Neurological:     General: No focal deficit present.     Mental Status: She is alert.  Psychiatric:        Mood and Affect: Mood normal.        Behavior: Behavior normal.    Lab Results  Component Value Date   WBC 7.9 11/10/2020   HGB 14.0 11/10/2020   HCT 41.2 11/10/2020   PLT 220.0 11/10/2020   GLUCOSE 87 11/10/2020   CHOL 168 04/24/2018   TRIG 54 04/24/2018    HDL 54 04/24/2018   LDLCALC 103 (H) 04/24/2018   ALT 11 11/10/2020   AST 17 11/10/2020   NA 138 11/10/2020   K 3.9 11/10/2020   CL 104 11/10/2020   CREATININE 0.77 11/10/2020   BUN 8 11/10/2020   CO2 25 11/10/2020   TSH 1.030 04/24/2018   HGBA1C 5.5 04/24/2018    US BREAST LTD UNI RIGHT INC AXILLA  Result Date: 07/14/2020 CLINICAL DATA:  Follow-up for probably benign mass in the RIGHT breast. This probably benign mass was originally identified in April 2021. EXAM: DIGITAL DIAGNOSTIC BILATERAL MAMMOGRAM WITH TOMOSYNTHESIS AND CAD; ULTRASOUND RIGHT BREAST LIMITED TECHNIQUE: Bilateral digital diagnostic mammography and breast tomosynthesis was performed. The images were evaluated with computer-aided detection.; Targeted ultrasound examination of the right breast was performed COMPARISON:  Previous exams including RIGHT breast ultrasound dated 07/04/2019. ACR Breast Density Category b: There are scattered areas of fibroglandular density. FINDINGS: Bilateral diagnostic mammogram: There are no new dominant masses, suspicious calcifications or secondary signs of malignancy within either breast. Targeted ultrasound is performed, again showing the oval circumscribed hypoechoic mass in the retroareolar RIGHT breast, 1 o'clock axis, measuring 4 x 2 x 3 mm, without internal vascularity stable and again most suggestive of probably benign complicated cyst with internal debris or cluster of microcysts. Today, an additional hypoechoic area is identified within the retroareolar RIGHT breast, 1 o'clock axis, measuring 3 x 3 x 3 mm, also without internal vascularity, also most suggestive of a benign cluster of microcysts during real-time ultrasound evaluation. IMPRESSION: Probably benign cysts (complicated cysts with internal debris and/or cluster of microcysts) within the retroareolar RIGHT breast, 1 o'clock axis, measuring 4 mm and 3 mm respectively. The probably benign cysts/cluster of microcysts that measures 3 mm  is a new finding today. As such, recommend follow-up RIGHT breast diagnostic mammogram and ultrasound in 6 months to ensure stability of this new probably benign finding and continued stability of the earlier probably benign finding. RECOMMENDATION: RIGHT breast diagnostic mammogram and ultrasound in 6 months. I have discussed the findings and recommendations with the patient. If applicable, a reminder letter will be sent to the patient regarding the next appointment. BI-RADS CATEGORY  3: Probably benign. Electronically Signed   By: Bary RichardStan  Maynard M.D.   On: 07/14/2020 10:10   MM DIAG BREAST TOMO BILATERAL  Result Date: 07/14/2020 CLINICAL DATA:  Follow-up for probably benign mass in the RIGHT breast. This probably benign mass was originally identified in April 2021. EXAM: DIGITAL DIAGNOSTIC BILATERAL MAMMOGRAM WITH TOMOSYNTHESIS AND CAD; ULTRASOUND RIGHT BREAST LIMITED TECHNIQUE: Bilateral digital diagnostic mammography and breast tomosynthesis was performed. The images were evaluated with computer-aided detection.; Targeted ultrasound examination of the right breast  was performed COMPARISON:  Previous exams including RIGHT breast ultrasound dated 07/04/2019. ACR Breast Density Category b: There are scattered areas of fibroglandular density. FINDINGS: Bilateral diagnostic mammogram: There are no new dominant masses, suspicious calcifications or secondary signs of malignancy within either breast. Targeted ultrasound is performed, again showing the oval circumscribed hypoechoic mass in the retroareolar RIGHT breast, 1 o'clock axis, measuring 4 x 2 x 3 mm, without internal vascularity stable and again most suggestive of probably benign complicated cyst with internal debris or cluster of microcysts. Today, an additional hypoechoic area is identified within the retroareolar RIGHT breast, 1 o'clock axis, measuring 3 x 3 x 3 mm, also without internal vascularity, also most suggestive of a benign cluster of microcysts  during real-time ultrasound evaluation. IMPRESSION: Probably benign cysts (complicated cysts with internal debris and/or cluster of microcysts) within the retroareolar RIGHT breast, 1 o'clock axis, measuring 4 mm and 3 mm respectively. The probably benign cysts/cluster of microcysts that measures 3 mm is a new finding today. As such, recommend follow-up RIGHT breast diagnostic mammogram and ultrasound in 6 months to ensure stability of this new probably benign finding and continued stability of the earlier probably benign finding. RECOMMENDATION: RIGHT breast diagnostic mammogram and ultrasound in 6 months. I have discussed the findings and recommendations with the patient. If applicable, a reminder letter will be sent to the patient regarding the next appointment. BI-RADS CATEGORY  3: Probably benign. Electronically Signed   By: Bary Richard M.D.   On: 07/14/2020 10:10   DG ABD ACUTE 2+V W 1V CHEST  Result Date: 11/10/2020 CLINICAL DATA:  Epigastric pain. EXAM: DG ABDOMEN ACUTE WITH 1 VIEW CHEST COMPARISON:  None. FINDINGS: There is no evidence of dilated bowel loops or free intraperitoneal air. Mildly increased colonic stool burden. No radiopaque calculi or other significant radiographic abnormality is seen. Small phleboliths in the right pelvis. Essure devices noted in the pelvis. Heart size and mediastinal contours are within normal limits. Both lungs are clear. IMPRESSION: 1. Mildly increased colonic stool burden. No acute findings. 2. No acute cardiopulmonary disease. Electronically Signed   By: Obie Dredge M.D.   On: 11/10/2020 16:16     Assessment & Plan:   Amaia was seen today for annual exam and abdominal pain.  Diagnoses and all orders for this visit:  Epigastric abdominal tenderness without rebound tenderness- There is mild tenderness but otherwise the exam is not consistent with an acute abdominal process.  She has no alarming symptoms.  Her labs and plain films are reassuring.  I  spoke to her the day after this visit and she said the pain had resolved.  This was likely a brief episode of gastritis. -     CBC with Differential/Platelet; Future -     Basic metabolic panel; Future -     Lipase; Future -     Amylase; Future -     Urinalysis, Routine w reflex microscopic; Future -     Hepatic function panel; Future -     hCG, quantitative, pregnancy; Future -     DG ABD ACUTE 2+V W 1V CHEST; Future -     hCG, quantitative, pregnancy -     Hepatic function panel -     Urinalysis, Routine w reflex microscopic -     Amylase -     Lipase -     Basic metabolic panel -     CBC with Differential/Platelet  Encounter for general adult medical examination with abnormal findings- Exam  completed, no labs are indicated, vaccines are up-to-date, cancer screenings are up-to-date, patient education was given.  I am having Edwena M. Cairrikier [name] maintain her Bernita Raisin, diclofenac, fluocinonide cream, and lisdexamfetamine.  No orders of the defined types were placed in this encounter.    Follow-up: No follow-ups on file.  Sanda Linger, MD

## 2020-11-11 LAB — URINALYSIS, ROUTINE W REFLEX MICROSCOPIC
Bilirubin Urine: NEGATIVE
Ketones, ur: 15 — AB
Leukocytes,Ua: NEGATIVE
Nitrite: POSITIVE — AB
Specific Gravity, Urine: 1.02 (ref 1.000–1.030)
Total Protein, Urine: NEGATIVE
Urine Glucose: NEGATIVE
Urobilinogen, UA: 1 (ref 0.0–1.0)
pH: 6 (ref 5.0–8.0)

## 2020-12-07 ENCOUNTER — Encounter: Payer: Self-pay | Admitting: Internal Medicine

## 2020-12-08 ENCOUNTER — Other Ambulatory Visit (HOSPITAL_COMMUNITY): Payer: Self-pay

## 2020-12-08 ENCOUNTER — Other Ambulatory Visit: Payer: Self-pay | Admitting: Internal Medicine

## 2020-12-08 DIAGNOSIS — F5081 Binge eating disorder: Secondary | ICD-10-CM

## 2020-12-08 MED ORDER — LISDEXAMFETAMINE DIMESYLATE 20 MG PO CAPS
ORAL_CAPSULE | Freq: Every day | ORAL | 0 refills | Status: DC
  Filled 2020-12-08: qty 30, 30d supply, fill #0

## 2020-12-09 ENCOUNTER — Other Ambulatory Visit (HOSPITAL_COMMUNITY): Payer: Self-pay

## 2020-12-16 DIAGNOSIS — Z6828 Body mass index (BMI) 28.0-28.9, adult: Secondary | ICD-10-CM | POA: Diagnosis not present

## 2020-12-16 DIAGNOSIS — M5020 Other cervical disc displacement, unspecified cervical region: Secondary | ICD-10-CM | POA: Diagnosis not present

## 2020-12-16 DIAGNOSIS — M5137 Other intervertebral disc degeneration, lumbosacral region: Secondary | ICD-10-CM | POA: Diagnosis not present

## 2020-12-16 DIAGNOSIS — Z9889 Other specified postprocedural states: Secondary | ICD-10-CM | POA: Diagnosis not present

## 2020-12-16 DIAGNOSIS — G8929 Other chronic pain: Secondary | ICD-10-CM | POA: Diagnosis not present

## 2020-12-16 DIAGNOSIS — M545 Low back pain, unspecified: Secondary | ICD-10-CM | POA: Diagnosis not present

## 2020-12-29 ENCOUNTER — Other Ambulatory Visit (HOSPITAL_COMMUNITY): Payer: Self-pay

## 2020-12-30 ENCOUNTER — Other Ambulatory Visit (HOSPITAL_COMMUNITY): Payer: Self-pay

## 2021-01-01 DIAGNOSIS — M461 Sacroiliitis, not elsewhere classified: Secondary | ICD-10-CM | POA: Diagnosis not present

## 2021-01-01 DIAGNOSIS — M47816 Spondylosis without myelopathy or radiculopathy, lumbar region: Secondary | ICD-10-CM | POA: Diagnosis not present

## 2021-01-01 DIAGNOSIS — M5126 Other intervertebral disc displacement, lumbar region: Secondary | ICD-10-CM | POA: Diagnosis not present

## 2021-01-07 DIAGNOSIS — M5416 Radiculopathy, lumbar region: Secondary | ICD-10-CM | POA: Diagnosis not present

## 2021-01-07 DIAGNOSIS — Z6828 Body mass index (BMI) 28.0-28.9, adult: Secondary | ICD-10-CM | POA: Diagnosis not present

## 2021-01-14 ENCOUNTER — Other Ambulatory Visit: Payer: 59

## 2021-01-30 DIAGNOSIS — N6452 Nipple discharge: Secondary | ICD-10-CM | POA: Diagnosis not present

## 2021-01-30 DIAGNOSIS — Z01419 Encounter for gynecological examination (general) (routine) without abnormal findings: Secondary | ICD-10-CM | POA: Diagnosis not present

## 2021-01-30 DIAGNOSIS — R350 Frequency of micturition: Secondary | ICD-10-CM | POA: Diagnosis not present

## 2021-01-30 DIAGNOSIS — Z130 Encounter for screening for diseases of the blood and blood-forming organs and certain disorders involving the immune mechanism: Secondary | ICD-10-CM | POA: Diagnosis not present

## 2021-01-30 DIAGNOSIS — Z1389 Encounter for screening for other disorder: Secondary | ICD-10-CM | POA: Diagnosis not present

## 2021-01-30 DIAGNOSIS — Z6829 Body mass index (BMI) 29.0-29.9, adult: Secondary | ICD-10-CM | POA: Diagnosis not present

## 2021-01-31 DIAGNOSIS — H52223 Regular astigmatism, bilateral: Secondary | ICD-10-CM | POA: Diagnosis not present

## 2021-01-31 DIAGNOSIS — H5203 Hypermetropia, bilateral: Secondary | ICD-10-CM | POA: Diagnosis not present

## 2021-02-06 ENCOUNTER — Ambulatory Visit
Admission: RE | Admit: 2021-02-06 | Discharge: 2021-02-06 | Disposition: A | Discharge status: Home / Self Care | Payer: 59 | Source: Ambulatory Visit | Attending: Gynecology | Admitting: Gynecology

## 2021-02-06 ENCOUNTER — Other Ambulatory Visit: Payer: Self-pay

## 2021-02-06 ENCOUNTER — Other Ambulatory Visit: Payer: Self-pay | Admitting: Gynecology

## 2021-02-06 DIAGNOSIS — N6310 Unspecified lump in the right breast, unspecified quadrant: Secondary | ICD-10-CM

## 2021-02-06 DIAGNOSIS — R928 Other abnormal and inconclusive findings on diagnostic imaging of breast: Secondary | ICD-10-CM | POA: Diagnosis not present

## 2021-02-06 DIAGNOSIS — N6011 Diffuse cystic mastopathy of right breast: Secondary | ICD-10-CM | POA: Diagnosis not present

## 2021-03-11 ENCOUNTER — Encounter: Payer: Self-pay | Admitting: Family Medicine

## 2021-03-11 ENCOUNTER — Ambulatory Visit: Payer: Self-pay

## 2021-03-11 ENCOUNTER — Ambulatory Visit: Payer: 59 | Admitting: Family Medicine

## 2021-03-11 VITALS — BP 100/70 | Ht 65.5 in | Wt 178.0 lb

## 2021-03-11 DIAGNOSIS — M533 Sacrococcygeal disorders, not elsewhere classified: Secondary | ICD-10-CM | POA: Diagnosis not present

## 2021-03-11 MED ORDER — TRIAMCINOLONE ACETONIDE 40 MG/ML IJ SUSP
40.0000 mg | Freq: Once | INTRAMUSCULAR | Status: AC
Start: 2021-03-11 — End: 2021-03-11
  Administered 2021-03-11: 10:00:00 40 mg via INTRA_ARTICULAR

## 2021-03-11 NOTE — Assessment & Plan Note (Addendum)
Acute on chronic in nature. Has degenerative changes of the L5-S1 facet joints. Pain occurring bilaterally but worse on the right today.   - counseled on home exercise therapy and supportive care - injection today  - could consider physical therapy

## 2021-03-11 NOTE — Patient Instructions (Signed)
Good to see you  Please try the exercises  Please use ice as needed  You can consider the compression  Please send me a message in MyChart with any questions or updates.  Please see me back in 4 weeks or as needed if better.   --Dr. Jordan Likes

## 2021-03-11 NOTE — Progress Notes (Signed)
Christina [name] - 40 y.o. female MRN [id number]  Date of birth: [birth year]  SUBJECTIVE:  Including CC & ROS.  No chief complaint on file.   Christina [name] [name] is a 40 y.o. female that is  presenting with bilateral lower back pain and gluteal pain. Acute on chronic in nature. Has tried different injections and exercises.  Independent review right hip x-ray from 2021 shows no acute changes.   Review of Systems See HPI   HISTORY: Past Medical, Surgical, Social, and Family History Reviewed & Updated per EMR.   Pertinent Historical Findings include:  Past Medical History:  Diagnosis Date   Back pain    Eczema    Major depressive disorder    Migraine    Normal pregnancy 03/11/2012   Ovarian cyst    Postpartum hemorrhage    SVD (spontaneous vaginal delivery) 03/11/2012    Past Surgical History:  Procedure Laterality Date   CERVICAL DISC ARTHROPLASTY     CERVICAL DISCECTOMY      Family History  Problem Relation Age of Onset   Arthritis Father    Sudden death Father 80       Sinus Cancer   Cancer Father        nasal cancer   Bipolar disorder Mother    Depression Mother    Anxiety disorder Mother    Alcohol abuse Mother    Drug abuse Mother    Obesity Mother    Sudden death Brother        GSW   Arthritis Maternal Grandfather    Hyperlipidemia Maternal Grandfather    Heart disease Maternal Grandfather    Breast cancer Maternal Grandmother    Arthritis Maternal Grandmother    Lung cancer Maternal Grandmother    Heart disease Maternal Grandmother    Hyperlipidemia Paternal Grandmother    Stroke Paternal Grandfather     Social History   Socioeconomic History   Marital status: Married    Spouse name: Sheria Lang   Number of children: 2   Years of education: Not on file   Highest education level: Not on file  Occupational History   Occupation: cma     Employer: [employer]  Tobacco Use   Smoking status: Every Day    Packs/day: 0.50     Years: 20.00    Pack years: 10.00    Types: Cigarettes   Smokeless tobacco: Never  Vaping Use   Vaping Use: Never used  Substance and Sexual Activity   Alcohol use: Yes    Alcohol/week: 0.0 standard drinks    Comment: occassionally for social functions   Drug use: No   Sexual activity: Yes    Partners: Male  Other Topics Concern   Not on file  Social History Narrative   Not on file   Social Determinants of Health   Financial Resource Strain: Not on file  Food Insecurity: Not on file  Transportation Needs: Not on file  Physical Activity: Not on file  Stress: Not on file  Social Connections: Not on file  Intimate Partner Violence: Not on file     PHYSICAL EXAM:  VS: BP 100/70 (BP Location: Left Arm, Patient Position: Sitting)    Ht 5' 5.5" (1.664 m)    Wt 178 lb (80.7 kg)    BMI 29.17 kg/m  Physical Exam Gen: NAD, alert, cooperative with exam, well-appearing     Aspiration/Injection Procedure Note Lovinia Snare Jan 31, 1981  Procedure: Injection Indications: right SI joint pain  Procedure Details Consent: Risks of procedure as well as the alternatives and risks of each were explained to the (patient/caregiver).  Consent for procedure obtained. Time Out: Verified patient identification, verified procedure, site/side was marked, verified correct patient position, special equipment/implants available, medications/allergies/relevent history reviewed, required imaging and test results available.  Performed.  The area was cleaned with iodine and alcohol swabs.    The right SI joint was injected using 3 cc's of 1% lidocaine with a 22-gauge 3-1/2 inch needle.  The syringe was switched and a mixture of 1 cc's of 40 mg kenalog and 4 cc's of 0.25% bupivicaine was injected.  Ultrasound was used. Images were obtained in long views showing the injection.     A sterile dressing was applied.  Patient did tolerate procedure well.     ASSESSMENT & PLAN:    Sacroiliac joint dysfunction of right side Acute on chronic in nature. Has degenerative changes of the L5-S1 facet joints. Pain occurring bilaterally but worse on the right today.   - counseled on home exercise therapy and supportive care - injection today  - could consider physical therapy

## 2021-03-22 HISTORY — PX: BREAST BIOPSY: SHX20

## 2021-04-02 ENCOUNTER — Other Ambulatory Visit (HOSPITAL_COMMUNITY): Payer: Self-pay

## 2021-04-02 MED ORDER — NORETHINDRONE ACETATE 5 MG PO TABS
ORAL_TABLET | ORAL | 0 refills | Status: DC
  Filled 2021-04-02: qty 30, 30d supply, fill #0

## 2021-04-03 ENCOUNTER — Other Ambulatory Visit (HOSPITAL_COMMUNITY): Payer: Self-pay

## 2021-04-08 ENCOUNTER — Other Ambulatory Visit (HOSPITAL_COMMUNITY): Payer: Self-pay

## 2021-04-27 ENCOUNTER — Other Ambulatory Visit (HOSPITAL_COMMUNITY)
Admission: RE | Admit: 2021-04-27 | Discharge: 2021-04-27 | Disposition: A | Discharge status: Home / Self Care | Payer: 59 | Source: Ambulatory Visit | Attending: Internal Medicine | Admitting: Internal Medicine

## 2021-04-27 ENCOUNTER — Other Ambulatory Visit: Payer: Self-pay

## 2021-04-27 ENCOUNTER — Ambulatory Visit: Payer: 59 | Admitting: Internal Medicine

## 2021-04-27 ENCOUNTER — Encounter: Payer: Self-pay | Admitting: Internal Medicine

## 2021-04-27 VITALS — BP 106/64 | HR 84 | Temp 98.2°F | Resp 16 | Ht 65.5 in | Wt 177.0 lb

## 2021-04-27 DIAGNOSIS — Z0001 Encounter for general adult medical examination with abnormal findings: Secondary | ICD-10-CM | POA: Diagnosis not present

## 2021-04-27 DIAGNOSIS — N898 Other specified noninflammatory disorders of vagina: Secondary | ICD-10-CM | POA: Insufficient documentation

## 2021-04-27 DIAGNOSIS — N939 Abnormal uterine and vaginal bleeding, unspecified: Secondary | ICD-10-CM

## 2021-04-27 DIAGNOSIS — B9689 Other specified bacterial agents as the cause of diseases classified elsewhere: Secondary | ICD-10-CM

## 2021-04-27 DIAGNOSIS — N760 Acute vaginitis: Secondary | ICD-10-CM

## 2021-04-27 NOTE — Progress Notes (Signed)
Subjective:  Patient ID: Christina [name], female    DOB: October 07, 1980  Age: 41 y.o. MRN: [id number]  CC: Annual Exam  This visit occurred during the SARS-CoV-2 public health emergency.  Safety protocols were in place, including screening questions prior to the visit, additional usage of staff PPE, and extensive cleaning of exam room while observing appropriate contact time as indicated for disinfecting solutions.    HPI Christina [name] presents for f/up -  She wants to be screened for STI's - she has no symptoms.  Outpatient Medications Prior to Visit  Medication Sig Dispense Refill   diclofenac (VOLTAREN) 50 MG EC tablet Take 50 mg by mouth 2 (two) times daily.     lisdexamfetamine (VYVANSE) 20 MG capsule TAKE 1 CAPSULE BY MOUTH ONCE A DAY 30 capsule 0   norethindrone (AYGESTIN) 5 MG tablet Take 1 tablet by mouth daily until no bleeding for 2 weeks 30 tablet 0   Ubrogepant (UBRELVY) 50 MG TABS Take 1 tablet by mouth as needed. 10 tablet 5   No facility-administered medications prior to visit.    ROS Review of Systems  Constitutional:  Negative for chills, fatigue and fever.  HENT: Negative.  Negative for sore throat and trouble swallowing.   Eyes: Negative.   Respiratory:  Negative for cough.   Cardiovascular:  Negative for chest pain.  Gastrointestinal:  Negative for abdominal pain, diarrhea and nausea.  Endocrine: Negative.   Genitourinary:  Negative for decreased urine volume, dyspareunia, dysuria, genital sores, menstrual problem, pelvic pain, vaginal bleeding, vaginal discharge and vaginal pain.  Musculoskeletal: Negative.   Skin:  Negative for rash.  Neurological:  Negative for dizziness, weakness, light-headedness and headaches.  Hematological:  Negative for adenopathy. Does not bruise/bleed easily.  Psychiatric/Behavioral: Negative.     Objective:  BP 106/64 (BP Location: Right Arm, Patient Position: Sitting, Cuff Size: Large)    Pulse  84    Temp 98.2 F (36.8 C) (Oral)    Resp 16    Ht 5' 5.5" (1.664 m)    Wt 177 lb (80.3 kg)    LMP 04/15/2021 (Exact Date)    SpO2 96%    BMI 29.01 kg/m   BP Readings from Last 3 Encounters:  04/27/21 106/64  03/11/21 100/70  11/10/20 100/60    Wt Readings from Last 3 Encounters:  04/27/21 177 lb (80.3 kg)  03/11/21 178 lb (80.7 kg)  11/10/20 174 lb (78.9 kg)    Physical Exam Vitals reviewed. Exam conducted with a chaperone present (Shirron).  HENT:     Nose: Nose normal.     Mouth/Throat:     Mouth: Mucous membranes are moist.  Eyes:     General: No scleral icterus. Cardiovascular:     Rate and Rhythm: Normal rate and regular rhythm.     Heart sounds: No murmur heard.   No friction rub.  Pulmonary:     Effort: Pulmonary effort is normal.     Breath sounds: No stridor. No wheezing, rhonchi or rales.  Abdominal:     General: Abdomen is flat.     Palpations: There is no mass.     Tenderness: There is no abdominal tenderness. There is no guarding.     Hernia: No hernia is present. There is no hernia in the left inguinal area or right inguinal area.  Genitourinary:    Exam position: Supine.     Pubic Area: No rash.      Labia:  Right: No rash, tenderness, lesion or injury.        Left: No rash, tenderness, lesion or injury.      Urethra: No prolapse, urethral pain, urethral swelling or urethral lesion.     Vagina: No foreign body. Vaginal discharge present. No erythema, tenderness, bleeding, lesions or prolapsed vaginal walls.     Cervix: No friability, lesion, erythema or cervical bleeding.     Uterus: Normal.      Adnexa: Right adnexa normal.       Right: No mass or tenderness.         Left: No mass or tenderness.       Comments: Moderate purulent exudate Musculoskeletal:        General: Normal range of motion.     Cervical back: Neck supple.     Right lower leg: No edema.     Left lower leg: No edema.  Lymphadenopathy:     Cervical: No cervical  adenopathy.     Lower Body: No right inguinal adenopathy. No left inguinal adenopathy.  Skin:    General: Skin is warm and dry.     Findings: No rash.  Neurological:     General: No focal deficit present.     Mental Status: She is alert.  Psychiatric:        Mood and Affect: Mood normal.        Behavior: Behavior normal.    Lab Results  Component Value Date   WBC 7.9 11/10/2020   HGB 14.0 11/10/2020   HCT 41.2 11/10/2020   PLT 220.0 11/10/2020   GLUCOSE 87 11/10/2020   CHOL 168 04/24/2018   TRIG 54 04/24/2018   HDL 54 04/24/2018   LDLCALC 103 (H) 04/24/2018   ALT 11 11/10/2020   AST 17 11/10/2020   NA 138 11/10/2020   K 3.9 11/10/2020   CL 104 11/10/2020   CREATININE 0.77 11/10/2020   BUN 8 11/10/2020   CO2 25 11/10/2020   TSH 1.030 04/24/2018   HGBA1C 5.5 04/24/2018    US BREAST LTD UNI RIGHT INC AXILLA  Result Date: 02/06/2021 CLINICAL DATA:  41 year old female with new palpable lump in the UPPER RIGHT breast discovered on self-examination. Also follow-up of 2 RETROAREOLAR RIGHT breast masses. EXAM: DIGITAL DIAGNOSTIC UNILATERAL RIGHT MAMMOGRAM WITH TOMOSYNTHESIS AND CAD; ULTRASOUND RIGHT BREAST LIMITED TECHNIQUE: Right digital diagnostic mammography and breast tomosynthesis was performed. The images were evaluated with computer-aided detection.; Targeted ultrasound examination of the right breast was performed COMPARISON:  Previous exam(s). ACR Breast Density Category b: There are scattered areas of fibroglandular density. FINDINGS: 2D/3D full field and spot compression views of the RIGHT breast demonstrate a stable circumscribed oval mass within the RETROAREOLAR RIGHT breast. No new or suspicious mammographic abnormalities are noted within the RIGHT breast. Targeted ultrasound is performed, showing no sonographic abnormality at the area of patient concern in the UPPER RIGHT breast. Also noted are the following: A stable 0.3 x 0.2 x 0.4 cm cluster of microcysts at the 1  o'clock position of the RETROAREOLAR RIGHT breast is noted. A 0.2 x 0.2 x 0.2 cm ill-defined hypoechoic area at the 1 o'clock position of the RETROAREOLAR RIGHT breast previously measured 0.3 x 0.3 x 0.3 cm. IMPRESSION: 1. No mammographic or sonographic abnormality in the UPPER RIGHT breast, at the site of patient concern. 2. Two separate RETROAREOLAR RIGHT breast masses, 1 which is stable and 1 which has slightly decreased in size, both likely benign. Six-month follow-up recommended to resume annual  mammogram schedule and to reassess RETROAREOLAR RIGHT breast mass. RECOMMENDATION: Bilateral diagnostic mammogram and RIGHT breast ultrasound in 6 months. I have discussed the findings and recommendations with the patient. If applicable, a reminder letter will be sent to the patient regarding the next appointment. BI-RADS CATEGORY  3: Probably benign. Electronically Signed   By: Harmon Pier M.D.   On: 02/06/2021 09:26  MM DIAG BREAST TOMO UNI RIGHT  Result Date: 02/06/2021 CLINICAL DATA:  41 year old female with new palpable lump in the UPPER RIGHT breast discovered on self-examination. Also follow-up of 2 RETROAREOLAR RIGHT breast masses. EXAM: DIGITAL DIAGNOSTIC UNILATERAL RIGHT MAMMOGRAM WITH TOMOSYNTHESIS AND CAD; ULTRASOUND RIGHT BREAST LIMITED TECHNIQUE: Right digital diagnostic mammography and breast tomosynthesis was performed. The images were evaluated with computer-aided detection.; Targeted ultrasound examination of the right breast was performed COMPARISON:  Previous exam(s). ACR Breast Density Category b: There are scattered areas of fibroglandular density. FINDINGS: 2D/3D full field and spot compression views of the RIGHT breast demonstrate a stable circumscribed oval mass within the RETROAREOLAR RIGHT breast. No new or suspicious mammographic abnormalities are noted within the RIGHT breast. Targeted ultrasound is performed, showing no sonographic abnormality at the area of patient concern in the  UPPER RIGHT breast. Also noted are the following: A stable 0.3 x 0.2 x 0.4 cm cluster of microcysts at the 1 o'clock position of the RETROAREOLAR RIGHT breast is noted. A 0.2 x 0.2 x 0.2 cm ill-defined hypoechoic area at the 1 o'clock position of the RETROAREOLAR RIGHT breast previously measured 0.3 x 0.3 x 0.3 cm. IMPRESSION: 1. No mammographic or sonographic abnormality in the UPPER RIGHT breast, at the site of patient concern. 2. Two separate RETROAREOLAR RIGHT breast masses, 1 which is stable and 1 which has slightly decreased in size, both likely benign. Six-month follow-up recommended to resume annual mammogram schedule and to reassess RETROAREOLAR RIGHT breast mass. RECOMMENDATION: Bilateral diagnostic mammogram and RIGHT breast ultrasound in 6 months. I have discussed the findings and recommendations with the patient. If applicable, a reminder letter will be sent to the patient regarding the next appointment. BI-RADS CATEGORY  3: Probably benign. Electronically Signed   By: Harmon Pier M.D.   On: 02/06/2021 09:26   Assessment & Plan:   Destinny was seen today for annual exam.  Diagnoses and all orders for this visit:  Vaginal bleeding, abnormal -     Cancel: GC/Chlamydia Probe Amp; Future  Vaginal discharge- Screening for GC/chlamydia/trich are negative. Will treat for BV. -     Chlamydia/Gonococcus/Trichomonas, NAA; Future -     Cytology - PAP( [facility]) -     RPR; Future -     RPR -     Chlamydia/Gonococcus/Trichomonas, NAA  Encounter for general adult medical examination with abnormal findings- Exam completed, no labs indicated, vaccines and cancer screenings are UTD, pt ed was given. -     HIV Antibody (routine testing w rflx); Future -     HIV Antibody (routine testing w rflx)  Bacterial vaginitis -     metroNIDAZOLE (FLAGYL) 500 MG tablet; Take 1 tablet (500 mg total) by mouth 2 (two) times daily for 7 days.  okNo alcoholok   I am having Christina [name]  start on metroNIDAZOLE. I am also having her maintain her Ubrelvy, lisdexamfetamine, norethindrone, and diclofenac.     Follow-up: Return in about 2 months (around 06/25/2021).  Sanda Linger, MD

## 2021-04-27 NOTE — Patient Instructions (Signed)
Vaginitis Vaginitis is irritation and swelling of the vagina. Treatment will depend on the cause. What are the causes? It can be caused by: Bacteria. Yeast. A parasite. A virus. Low hormone levels. Bubble baths, scented tampons, and feminine sprays. Other things can change the balance of the yeast and bacteria that live in the vagina. These include: Antibiotic medicines. Not being clean enough. Some birth control methods. Sex. Infection. Diabetes. A weakened body defense system (immune system). What increases the risk? Smoking or being around someone who smokes. Using washes (douches), scented tampons, or scented pads. Wearing tight pants or thong underwear. Using birth control pills or an IUD. Having sex without a condom or having a lot of partners. Having an STI. Using a certain product to kill sperm (nonoxynol-9). Eating foods that are high in sugar. Having diabetes. Having low levels of a female hormone. Having a weakened body defense system. Being pregnant or breastfeeding. What are the signs or symptoms? Fluid coming from the vagina that is not normal. A bad smell. Itching, pain, or swelling. Pain with sex. Pain or burning when you pee (urinate). Sometimes there are no symptoms. How is this treated? Treatment may include: Antibiotic creams or pills. Antifungal medicines. Medicines to ease symptoms if you have a virus. Your sex partner should also be treated. Estrogen medicines. Avoiding scented soaps, sprays, or douches. Stopping use of products that caused irritation and then using a cream to treat symptoms. Follow these instructions at home: Lifestyle Keep the area around your vagina clean and dry. Avoid using soap. Rinse the area with water. Until your doctor says it is okay: Do not use washes for the vagina. Do not use tampons. Do not have sex. Wipe from front to back after going to the bathroom. When your doctor says it is okay, practice safe sex and  use condoms. General instructions Take over-the-counter and prescription medicines only as told by your doctor. If you were prescribed an antibiotic medicine, take or use it as told by your doctor. Do not stop taking or using it even if you start to feel better. Keep all follow-up visits. How is this prevented? Do not use things that can irritate the vagina, such as fabric softeners. Avoid these products if they are scented: Sprays. Detergents. Tampons. Products for cleaning the vagina. Soaps or bubble baths. Let air reach your vagina. To do this: Wear cotton underwear. Do not wear: Underwear while you sleep. Tight pants. Thong underwear. Underwear or nylons without a cotton panel. Take off any wet clothing, such as bathing suits, as soon as you can. Practice safe sex and use condoms. Contact a doctor if: You have pain in your belly or in the area between your hips. You have a fever or chills. Your symptoms last for more than 2-3 days. Get help right away if: You have a fever and your symptoms get worse all of a sudden. Summary Vaginitis is irritation and swelling of the vagina. Treatment will depend on the cause of the condition. Do not use washes or tampons or have sex until your doctor says it is okay. This information is not intended to replace advice given to you by your health care provider. Make sure you discuss any questions you have with your health care provider. Document Revised: 09/06/2019 Document Reviewed: 09/06/2019 Elsevier Patient Education  2022 [employer]  

## 2021-04-28 LAB — RPR: RPR Ser Ql: NONREACTIVE

## 2021-04-28 LAB — HIV ANTIBODY (ROUTINE TESTING W REFLEX): HIV 1&2 Ab, 4th Generation: NONREACTIVE

## 2021-04-29 ENCOUNTER — Encounter: Payer: Self-pay | Admitting: Internal Medicine

## 2021-04-29 ENCOUNTER — Other Ambulatory Visit (HOSPITAL_COMMUNITY): Payer: Self-pay

## 2021-04-29 DIAGNOSIS — B9689 Other specified bacterial agents as the cause of diseases classified elsewhere: Secondary | ICD-10-CM | POA: Insufficient documentation

## 2021-04-29 LAB — CHLAMYDIA/GONOCOCCUS/TRICHOMONAS, NAA
Chlamydia by NAA: NEGATIVE
Gonococcus by NAA: NEGATIVE
Trich vag by NAA: NEGATIVE

## 2021-04-29 MED ORDER — METRONIDAZOLE 500 MG PO TABS
500.0000 mg | ORAL_TABLET | Freq: Two times a day (BID) | ORAL | 0 refills | Status: AC
  Filled 2021-04-29: qty 14, 7d supply, fill #0

## 2021-05-01 LAB — CYTOLOGY - PAP
Comment: NEGATIVE
Diagnosis: UNDETERMINED — AB
High risk HPV: NEGATIVE

## 2021-05-07 ENCOUNTER — Ambulatory Visit: Payer: 59 | Admitting: Family Medicine

## 2021-05-07 ENCOUNTER — Ambulatory Visit: Payer: Self-pay

## 2021-05-07 ENCOUNTER — Encounter: Payer: Self-pay | Admitting: Family Medicine

## 2021-05-07 VITALS — BP 100/70 | Ht 65.5 in | Wt 177.0 lb

## 2021-05-07 DIAGNOSIS — M533 Sacrococcygeal disorders, not elsewhere classified: Secondary | ICD-10-CM

## 2021-05-07 MED ORDER — TRIAMCINOLONE ACETONIDE 40 MG/ML IJ SUSP
40.0000 mg | Freq: Once | INTRAMUSCULAR | Status: AC
  Administered 2021-05-07: 40 mg via INTRA_ARTICULAR

## 2021-05-07 NOTE — Progress Notes (Signed)
°  Christina [name] - 41 y.o. female MRN [id number]  Date of birth: [birth year]  SUBJECTIVE:  Including CC & ROS.  No chief complaint on file.   Christina [name] is a 41 y.o. female that is presenting with worsening left-sided low back pain.  The left SI joint is tender and affects her when she is becoming more active.    Review of Systems See HPI   HISTORY: Past Medical, Surgical, Social, and Family History Reviewed & Updated per EMR.   Pertinent Historical Findings include:  Past Medical History:  Diagnosis Date   Back pain    Eczema    Major depressive disorder    Migraine    Normal pregnancy 03/11/2012   Ovarian cyst    Postpartum hemorrhage    SVD (spontaneous vaginal delivery) 03/11/2012    Past Surgical History:  Procedure Laterality Date   CERVICAL DISC ARTHROPLASTY     CERVICAL DISCECTOMY       PHYSICAL EXAM:  VS: BP 100/70 (BP Location: Left Arm, Patient Position: Sitting)    Ht 5' 5.5" (1.664 m)    Wt 177 lb (80.3 kg)    LMP 04/15/2021 (Exact Date)    BMI 29.01 kg/m  Physical Exam Gen: NAD, alert, cooperative with exam, well-appearing MSK:  Neurovascularly intact     Aspiration/Injection Procedure Note Christina [name] 17-Apr-1980  Procedure: Injection Indications: Left SI joint pain  Procedure Details Consent: Risks of procedure as well as the alternatives and risks of each were explained to the (patient/caregiver).  Consent for procedure obtained. Time Out: Verified patient identification, verified procedure, site/side was marked, verified correct patient position, special equipment/implants available, medications/allergies/relevent history reviewed, required imaging and test results available.  Performed.  The area was cleaned with iodine and alcohol swabs.    The left SI joint was injected using 3 cc of 1% lidocaine on a 22-gauge 3-1/2 inch needle.  The syringe was switched and a mixture containing 1 cc's  of 40 mg Kenalog  and 4 cc's of 0.25% bupivacaine was injected.  Ultrasound was needed to evaluate for needle placement within the joint at the endpoint.  Ultrasound was used. Images were obtained in long views showing the injection.     A sterile dressing was applied.  Patient did tolerate procedure well.     ASSESSMENT & PLAN:   Sacroiliac joint dysfunction of left side Acutely occurring.  Having more dysfunction and pain on the left side today. -Counseled on home exercise therapy and supportive care. -Counseled on compression. -Injection today. -Could consider facet injections versus physical therapy.

## 2021-05-07 NOTE — Patient Instructions (Signed)
Good to see you Please use ice and heat  Please continue the exercises  Please consider compression   Please send me a message in MyChart with any questions or updates.  Please see me back in 6-8 weeks or as needed if better.   --Dr. Jordan Likes

## 2021-05-07 NOTE — Assessment & Plan Note (Signed)
Acutely occurring.  Having more dysfunction and pain on the left side today. -Counseled on home exercise therapy and supportive care. -Counseled on compression. -Injection today. -Could consider facet injections versus physical therapy.

## 2021-05-18 ENCOUNTER — Other Ambulatory Visit: Payer: Self-pay | Admitting: Internal Medicine

## 2021-05-18 ENCOUNTER — Other Ambulatory Visit (HOSPITAL_COMMUNITY): Payer: Self-pay

## 2021-05-18 DIAGNOSIS — L309 Dermatitis, unspecified: Secondary | ICD-10-CM

## 2021-05-18 MED ORDER — FLUOCINONIDE 0.05 % EX CREA
TOPICAL_CREAM | Freq: Two times a day (BID) | CUTANEOUS | 2 refills | Status: AC
Start: 2021-05-18 — End: 2022-05-18
  Filled 2021-05-18: qty 60, 30d supply, fill #0
  Filled 2021-12-27: qty 60, 30d supply, fill #1

## 2021-05-19 ENCOUNTER — Other Ambulatory Visit (HOSPITAL_COMMUNITY): Payer: Self-pay

## 2021-07-19 ENCOUNTER — Telehealth: Payer: 59 | Admitting: Family

## 2021-07-19 DIAGNOSIS — J019 Acute sinusitis, unspecified: Secondary | ICD-10-CM

## 2021-07-19 MED ORDER — AMOXICILLIN-POT CLAVULANATE 875-125 MG PO TABS: 1.0000 | ORAL_TABLET | Freq: Two times a day (BID) | ORAL | 0 refills | Status: DC

## 2021-07-19 NOTE — Progress Notes (Signed)

## 2021-08-07 ENCOUNTER — Ambulatory Visit
Admission: RE | Admit: 2021-08-07 | Discharge: 2021-08-07 | Disposition: A | Discharge status: Home / Self Care | Payer: 59 | Source: Ambulatory Visit | Attending: Gynecology | Admitting: Gynecology

## 2021-08-07 ENCOUNTER — Other Ambulatory Visit: Payer: Self-pay | Admitting: Gynecology

## 2021-08-07 DIAGNOSIS — N6311 Unspecified lump in the right breast, upper outer quadrant: Secondary | ICD-10-CM | POA: Diagnosis not present

## 2021-08-07 DIAGNOSIS — N6310 Unspecified lump in the right breast, unspecified quadrant: Secondary | ICD-10-CM

## 2021-08-07 DIAGNOSIS — R928 Other abnormal and inconclusive findings on diagnostic imaging of breast: Secondary | ICD-10-CM | POA: Diagnosis not present

## 2021-08-21 ENCOUNTER — Ambulatory Visit
Admission: RE | Admit: 2021-08-21 | Discharge: 2021-08-21 | Disposition: A | Discharge status: Home / Self Care | Payer: 59 | Source: Ambulatory Visit | Attending: Gynecology | Admitting: Gynecology

## 2021-08-21 DIAGNOSIS — N6001 Solitary cyst of right breast: Secondary | ICD-10-CM | POA: Diagnosis not present

## 2021-08-21 DIAGNOSIS — N6310 Unspecified lump in the right breast, unspecified quadrant: Secondary | ICD-10-CM

## 2021-08-21 DIAGNOSIS — N6311 Unspecified lump in the right breast, upper outer quadrant: Secondary | ICD-10-CM | POA: Diagnosis not present

## 2021-10-28 ENCOUNTER — Encounter: Payer: Self-pay | Admitting: Family Medicine

## 2021-10-28 ENCOUNTER — Ambulatory Visit: Payer: 59 | Admitting: Family Medicine

## 2021-10-28 ENCOUNTER — Ambulatory Visit: Payer: Self-pay

## 2021-10-28 VITALS — BP 100/66 | Ht 65.5 in | Wt 168.0 lb

## 2021-10-28 DIAGNOSIS — M7701 Medial epicondylitis, right elbow: Secondary | ICD-10-CM | POA: Diagnosis not present

## 2021-10-28 MED ORDER — METHYLPREDNISOLONE ACETATE 40 MG/ML IJ SUSP
40.0000 mg | Freq: Once | INTRAMUSCULAR | Status: AC
  Administered 2021-10-28: 40 mg via INTRA_ARTICULAR

## 2021-10-28 NOTE — Assessment & Plan Note (Signed)
Acute occurring. Clinical exam consistent with golfer's elbow  -Counseled on home exercise therapy and supportive care. -Injection today provided -Counseled on nitro patches. -Could consider shockwave therapy, physical therapy or PRP.

## 2021-10-28 NOTE — Progress Notes (Signed)
  Dorathea Faerber - 41 y.o. female MRN [id number]  Date of birth: Aug 28, 1980  SUBJECTIVE:  Including CC & ROS.  No chief complaint on file.   Othella M [name] is a 41 y.o. female that is presenting with acute right medial elbow pain.  The pain has been ongoing for 1 month.  The pain occurred after getting back repetitive exercises.  No improvement with rest.   Review of Systems See HPI   HISTORY: Past Medical, Surgical, Social, and Family History Reviewed & Updated per EMR.   Pertinent Historical Findings include:  Past Medical History:  Diagnosis Date   Back pain    Eczema    Major depressive disorder    Migraine    Normal pregnancy 03/11/2012   Ovarian cyst    Postpartum hemorrhage    SVD (spontaneous vaginal delivery) 03/11/2012    Past Surgical History:  Procedure Laterality Date   CERVICAL DISC ARTHROPLASTY     CERVICAL DISCECTOMY       PHYSICAL EXAM:  VS: BP 100/66 (BP Location: Left Arm, Patient Position: Sitting)   Ht 5' 5.5" (1.664 m)   Wt 168 lb (76.2 kg)   BMI 27.53 kg/m  Physical Exam Gen: NAD, alert, cooperative with exam, well-appearing MSK:  Neurovascularly intact    Limited ultrasound: Right elbow:  No tear is appreciated in the common flexor tendon.  Summary: No significant structural changes.  Ultrasound and interpretation by Clare Gandy, MD  Aspiration/Injection Procedure Note [name] Jul 01, 1980  Procedure: Injection Indications: Right elbow pain  Procedure Details Consent: Risks of procedure as well as the alternatives and risks of each were explained to the (patient/caregiver).  Consent for procedure obtained. Time Out: Verified patient identification, verified procedure, site/side was marked, verified correct patient position, special equipment/implants available, medications/allergies/relevent history reviewed, required imaging and test results available.  Performed.  The area  was cleaned with iodine and alcohol swabs.    The right medial epicondyle was injected using 1 cc's of 40 mg Depo-Medrol and 1 cc's of 0.25% bupivacaine with a 25 1 1/2" needle.  Ultrasound was used. Images were obtained in long views showing the injection.     A sterile dressing was applied.  Patient did tolerate procedure well.     ASSESSMENT & PLAN:   Medial epicondylitis of right elbow Acute occurring. Clinical exam consistent with golfer's elbow  -Counseled on home exercise therapy and supportive care. -Injection today provided -Counseled on nitro patches. -Could consider shockwave therapy, physical therapy or PRP.

## 2021-10-28 NOTE — Patient Instructions (Addendum)
Good to see you Please use ice as needed  Please try the exercises  You can try the nitro patches   You can consider compression  Please send me a message in MyChart with any questions or updates.  Please see me back as needed.   --Dr. Jordan Likes  Nitroglycerin Protocol  Apply 1/4 nitroglycerin patch to affected area daily. Change position of patch within the affected area every 24 hours. You may experience a headache during the first 1-2 weeks of using the patch, these should subside. If you experience headaches after beginning nitroglycerin patch treatment, you may take your preferred over the counter pain reliever. Another side effect of the nitroglycerin patch is skin irritation or rash related to patch adhesive. Please notify our office if you develop more severe headaches or rash, and stop the patch. Tendon healing with nitroglycerin patch may require 12 to 24 weeks depending on the extent of injury. Men should not use if taking Viagra, Cialis, or Levitra.  Do not use if you have migraines or rosacea.

## 2021-11-02 ENCOUNTER — Ambulatory Visit: Payer: 59 | Admitting: Internal Medicine

## 2021-12-27 ENCOUNTER — Telehealth: Payer: 59 | Admitting: Physician Assistant

## 2021-12-27 ENCOUNTER — Other Ambulatory Visit: Payer: Self-pay | Admitting: Internal Medicine

## 2021-12-27 DIAGNOSIS — H1033 Unspecified acute conjunctivitis, bilateral: Secondary | ICD-10-CM

## 2021-12-27 DIAGNOSIS — M5136 Other intervertebral disc degeneration, lumbar region: Secondary | ICD-10-CM

## 2021-12-27 MED ORDER — POLYMYXIN B-TRIMETHOPRIM 10000-0.1 UNIT/ML-% OP SOLN: OPHTHALMIC | 0 refills | Status: DC

## 2021-12-27 NOTE — Progress Notes (Signed)
I have spent 5 minutes in review of e-visit questionnaire, review and updating patient chart, medical decision making and response to patient.   Maimuna Leaman Cody Aveer Bartow, PA-C    

## 2021-12-27 NOTE — Progress Notes (Signed)

## 2021-12-28 ENCOUNTER — Other Ambulatory Visit (HOSPITAL_COMMUNITY): Payer: Self-pay

## 2021-12-28 MED ORDER — DICLOFENAC SODIUM 50 MG PO TBEC
50.0000 mg | DELAYED_RELEASE_TABLET | Freq: Two times a day (BID) | ORAL | 0 refills | Status: DC
  Filled 2021-12-28: qty 180, 90d supply, fill #0

## 2021-12-29 ENCOUNTER — Other Ambulatory Visit (HOSPITAL_COMMUNITY): Payer: Self-pay

## 2022-01-04 ENCOUNTER — Other Ambulatory Visit (HOSPITAL_COMMUNITY): Payer: Self-pay

## 2022-02-09 DIAGNOSIS — H524 Presbyopia: Secondary | ICD-10-CM | POA: Diagnosis not present

## 2022-02-09 DIAGNOSIS — H52223 Regular astigmatism, bilateral: Secondary | ICD-10-CM | POA: Diagnosis not present

## 2022-02-17 DIAGNOSIS — Z1389 Encounter for screening for other disorder: Secondary | ICD-10-CM | POA: Diagnosis not present

## 2022-02-17 DIAGNOSIS — Z6831 Body mass index (BMI) 31.0-31.9, adult: Secondary | ICD-10-CM | POA: Diagnosis not present

## 2022-02-17 DIAGNOSIS — Z01419 Encounter for gynecological examination (general) (routine) without abnormal findings: Secondary | ICD-10-CM | POA: Diagnosis not present

## 2022-02-17 DIAGNOSIS — Z130 Encounter for screening for diseases of the blood and blood-forming organs and certain disorders involving the immune mechanism: Secondary | ICD-10-CM | POA: Diagnosis not present

## 2022-07-05 ENCOUNTER — Encounter: Payer: Self-pay | Admitting: *Deleted

## 2022-09-15 ENCOUNTER — Encounter: Payer: Commercial Managed Care - PPO | Admitting: Internal Medicine

## 2022-09-28 ENCOUNTER — Encounter: Payer: Commercial Managed Care - PPO | Admitting: Internal Medicine

## 2022-09-30 ENCOUNTER — Encounter: Payer: Self-pay | Admitting: Internal Medicine

## 2022-09-30 ENCOUNTER — Ambulatory Visit (INDEPENDENT_AMBULATORY_CARE_PROVIDER_SITE_OTHER): Payer: Commercial Managed Care - PPO | Admitting: Internal Medicine

## 2022-09-30 VITALS — BP 120/82 | HR 72 | Temp 98.3°F | Resp 16 | Ht 65.5 in | Wt 192.0 lb

## 2022-09-30 DIAGNOSIS — E6609 Other obesity due to excess calories: Secondary | ICD-10-CM | POA: Diagnosis not present

## 2022-09-30 DIAGNOSIS — Z13220 Encounter for screening for lipoid disorders: Secondary | ICD-10-CM | POA: Diagnosis not present

## 2022-09-30 DIAGNOSIS — M5136 Other intervertebral disc degeneration, lumbar region: Secondary | ICD-10-CM

## 2022-09-30 DIAGNOSIS — Z0000 Encounter for general adult medical examination without abnormal findings: Secondary | ICD-10-CM

## 2022-09-30 DIAGNOSIS — H1131 Conjunctival hemorrhage, right eye: Secondary | ICD-10-CM

## 2022-09-30 DIAGNOSIS — Z6831 Body mass index (BMI) 31.0-31.9, adult: Secondary | ICD-10-CM | POA: Diagnosis not present

## 2022-09-30 DIAGNOSIS — Z0001 Encounter for general adult medical examination with abnormal findings: Secondary | ICD-10-CM | POA: Diagnosis not present

## 2022-09-30 DIAGNOSIS — M7701 Medial epicondylitis, right elbow: Secondary | ICD-10-CM

## 2022-09-30 LAB — BASIC METABOLIC PANEL
BUN: 9 mg/dL (ref 6–23)
CO2: 27 mEq/L (ref 19–32)
Calcium: 9.4 mg/dL (ref 8.4–10.5)
Chloride: 104 mEq/L (ref 96–112)
Creatinine, Ser: 0.79 mg/dL (ref 0.40–1.20)
GFR: 92.73 mL/min (ref >60.00)
Glucose, Bld: 104 mg/dL — ABNORMAL HIGH (ref 70–99)
Potassium: 4.1 mEq/L (ref 3.5–5.1)
Sodium: 137 mEq/L (ref 135–145)

## 2022-09-30 LAB — TSH: TSH: 2.02 u[IU]/mL (ref 0.35–5.50)

## 2022-09-30 LAB — CBC WITH DIFFERENTIAL/PLATELET
Basophils Absolute: 0 10*3/uL (ref 0.0–0.1)
Basophils Relative: 0.6 % (ref 0.0–3.0)
Eosinophils Absolute: 0.3 10*3/uL (ref 0.0–0.7)
Eosinophils Relative: 4.1 % (ref 0.0–5.0)
HCT: 42.8 % (ref 36.0–46.0)
Hemoglobin: 14.2 g/dL (ref 12.0–15.0)
Lymphocytes Relative: 29.1 % (ref 12.0–46.0)
Lymphs Abs: 2.3 10*3/uL (ref 0.7–4.0)
MCHC: 33.3 g/dL (ref 30.0–36.0)
MCV: 90.6 fl (ref 78.0–100.0)
Monocytes Absolute: 0.6 10*3/uL (ref 0.1–1.0)
Monocytes Relative: 7.7 % (ref 3.0–12.0)
Neutro Abs: 4.6 10*3/uL (ref 1.4–7.7)
Neutrophils Relative %: 58.5 % (ref 43.0–77.0)
Platelets: 232 10*3/uL (ref 150.0–400.0)
RBC: 4.73 Mil/uL (ref 3.87–5.11)
RDW: 13.4 % (ref 11.5–15.5)
WBC: 7.8 10*3/uL (ref 4.0–10.5)

## 2022-09-30 LAB — HEPATIC FUNCTION PANEL
ALT: 15 U/L (ref 0–35)
AST: 18 U/L (ref 0–37)
Albumin: 4.5 g/dL (ref 3.5–5.2)
Alkaline Phosphatase: 42 U/L (ref 39–117)
Bilirubin, Direct: 0.1 mg/dL (ref 0.0–0.3)
Total Bilirubin: 0.7 mg/dL (ref 0.2–1.2)
Total Protein: 7.2 g/dL (ref 6.0–8.3)

## 2022-09-30 LAB — LIPID PANEL
Cholesterol: 167 mg/dL (ref 0–200)
HDL: 57.9 mg/dL (ref >39.00)
LDL Cholesterol: 86 mg/dL (ref 0–99)
NonHDL: 109.27
Total CHOL/HDL Ratio: 3
Triglycerides: 116 mg/dL (ref 0.0–149.0)
VLDL: 23.2 mg/dL (ref 0.0–40.0)

## 2022-09-30 NOTE — Patient Instructions (Signed)

## 2022-09-30 NOTE — Progress Notes (Signed)
Subjective:  Patient ID: Christina [name], female    DOB: 04-30-1980  Age: 42 y.o. MRN: [id number]  CC: Annual Exam and Back Pain   HPI Christina [name] presents for a CPX and f/up ----  Discussed the use of AI scribe software for clinical note transcription with the patient, who gave verbal consent to proceed.  History of Present Illness   The patient presents with a complaint of redness in the right eye. She denies any associated discomfort, vision changes, bleeding, or discharge from the eye. There is no history of trauma or injury to the eye, although she reports recent episodes of sneezing and coughing.  In terms of general health, the patient reports improvement and has been engaging in physical exercise twice a week as part of a PE class. She is currently taking diclofenac for pain management, along with fish oil and a topical fluticasone cream. She denies current use of Augmentin, Norethindrone, or Trimethoprim/Polymyxin.  The patient's last menstrual cycle was on June 17th, and she denies any concerns about pregnancy. She reports having a tubal ligation.       Outpatient Medications Prior to Visit  Medication Sig Dispense Refill   amoxicillin-clavulanate (AUGMENTIN) 875-125 MG tablet Take 1 tablet by mouth 2 (two) times daily. 14 tablet 0   diclofenac (VOLTAREN) 50 MG EC tablet Take 1 tablet (50 mg total) by mouth 2 (two) times daily. 180 tablet 0   norethindrone (AYGESTIN) 5 MG tablet Take 1 tablet by mouth daily until no bleeding for 2 weeks 30 tablet 0   trimethoprim-polymyxin b (POLYTRIM) ophthalmic solution Apply 1-2 drops into affected eye QID x 5 days. 10 mL 0   Ubrogepant (UBRELVY) 50 MG TABS Take 1 tablet by mouth as needed. 10 tablet 5   lisdexamfetamine (VYVANSE) 20 MG capsule TAKE 1 CAPSULE BY MOUTH ONCE A DAY 30 capsule 0   No facility-administered medications prior to visit.    ROS Review of Systems  Constitutional:  Positive  for unexpected weight change (wt gain). Negative for appetite change, chills, diaphoresis and fatigue.  HENT: Negative.    Eyes:  Positive for redness. Negative for photophobia, pain, discharge, itching and visual disturbance.  Respiratory: Negative.  Negative for cough, shortness of breath and wheezing.   Cardiovascular:  Negative for chest pain, palpitations and leg swelling.  Gastrointestinal:  Negative for abdominal pain, constipation, diarrhea, nausea and vomiting.  Genitourinary: Negative.  Negative for difficulty urinating.  Musculoskeletal:  Positive for arthralgias and back pain. Negative for joint swelling and myalgias.  Neurological: Negative.  Negative for dizziness and headaches.  Hematological:  Negative for adenopathy. Does not bruise/bleed easily.  Psychiatric/Behavioral: Negative.      Objective:  BP 120/82 (BP Location: Right Arm, Patient Position: Sitting, Cuff Size: Large)   Pulse 72   Temp 98.3 F (36.8 C) (Oral)   Resp 16   Ht 5' 5.5" (1.664 m)   Wt 192 lb (87.1 kg)   LMP 09/06/2022 (Exact Date)   SpO2 97%   BMI 31.46 kg/m   BP Readings from Last 3 Encounters:  09/30/22 120/82  10/28/21 100/66  05/07/21 100/70    Wt Readings from Last 3 Encounters:  09/30/22 192 lb (87.1 kg)  10/28/21 168 lb (76.2 kg)  05/07/21 177 lb (80.3 kg)    Physical Exam Vitals reviewed.  Constitutional:      Appearance: Normal appearance.  HENT:     Mouth/Throat:     Mouth: Mucous membranes are  moist.  Eyes:     General: No scleral icterus.       Right eye: No foreign body, discharge or hordeolum.        Left eye: No foreign body, discharge or hordeolum.     Conjunctiva/sclera:     Right eye: Right conjunctiva is injected. No chemosis.    Left eye: Left conjunctiva is not injected. No chemosis.    Pupils: Pupils are equal, round, and reactive to light.  Cardiovascular:     Rate and Rhythm: Normal rate and regular rhythm.     Heart sounds: No murmur heard.    No  gallop.  Pulmonary:     Effort: Pulmonary effort is normal.     Breath sounds: No stridor. No wheezing, rhonchi or rales.  Abdominal:     General: Abdomen is flat.     Palpations: There is no mass.     Tenderness: There is no abdominal tenderness. There is no guarding.     Hernia: No hernia is present.  Musculoskeletal:        General: No deformity. Normal range of motion.     Right elbow: No swelling or deformity. Normal range of motion. Tenderness present in medial epicondyle. No lateral epicondyle tenderness.     Left elbow: No swelling or deformity. Normal range of motion.     Right lower leg: No edema.     Left lower leg: No edema.  Lymphadenopathy:     Cervical: No cervical adenopathy.  Skin:    General: Skin is warm and dry.     Findings: No lesion.  Neurological:     General: No focal deficit present.     Mental Status: She is alert. Mental status is at baseline.  Psychiatric:        Mood and Affect: Mood normal.        Behavior: Behavior normal.     Lab Results  Component Value Date   WBC 7.8 09/30/2022   HGB 14.2 09/30/2022   HCT 42.8 09/30/2022   PLT 232.0 09/30/2022   GLUCOSE 104 (H) 09/30/2022   CHOL 167 09/30/2022   TRIG 116.0 09/30/2022   HDL 57.90 09/30/2022   LDLCALC 86 09/30/2022   ALT 15 09/30/2022   AST 18 09/30/2022   NA 137 09/30/2022   K 4.1 09/30/2022   CL 104 09/30/2022   CREATININE 0.79 09/30/2022   BUN 9 09/30/2022   CO2 27 09/30/2022   TSH 2.02 09/30/2022   HGBA1C 5.5 04/24/2018    [name] RT BREAST BX W LOC DEV 1ST LESION IMG BX SPEC US GUIDE  Addendum Date: 08/31/2021   ADDENDUM REPORT: 08/31/2021 11:36 ADDENDUM: Pathology revealed Breast, RIGHT, needle core biopsy, 1 o'clock, ribbon clip- AGGREGATED CYSTS WITH APOCRINE METAPLASIA- NEGATIVE FOR MALIGNANCY. This was found to be concordant by Dr. Baird Lyons. Pathology results were discussed with the patient by telephone Hezzie Bump RN. The patient reported doing well after the biopsy  with tenderness and bruising at the site. Post biopsy instructions and care were reviewed and questions were answered. The patient was encouraged to call [facility] for any additional concerns. The patient was instructed to return for annual screening mammography and informed a reminder notice would be sent regarding this appointment. Pathology results reported by Collene Mares RN on 08/31/2021. Electronically Signed   By: Baird Lyons M.D.   On: 08/31/2021 11:36   Result Date: 08/31/2021 CLINICAL DATA:  Right breast mass. EXAM: ULTRASOUND GUIDED  RIGHT BREAST CORE NEEDLE BIOPSY COMPARISON:  None Available. PROCEDURE: I met with the patient and we discussed the procedure of ultrasound-guided biopsy, including benefits and alternatives. We discussed the high likelihood of a successful procedure. We discussed the risks of the procedure, including infection, bleeding, tissue injury, clip migration, and inadequate sampling. Informed written consent was given. The usual time-out protocol was performed immediately prior to the procedure. Lesion quadrant: Upper-outer Using sterile technique and 1% Lidocaine as local anesthetic, under direct ultrasound visualization, a 14 gauge spring-loaded device was used to perform biopsy of a mass in the 1 o'clock retroareolar region of the right breast using a lateral to medial approach. At the conclusion of the procedure ribbon shaped tissue marker clip was deployed into the biopsy cavity. Follow up 2 view mammogram was performed and dictated separately. IMPRESSION: Ultrasound guided biopsy of the right breast. No apparent complications. Electronically Signed: By: Baird Lyons M.D. On: 08/21/2021 08:16  MM CLIP PLACEMENT RIGHT  Result Date: 08/21/2021 CLINICAL DATA:  Status post ultrasound-guided core biopsy of a right breast mass EXAM: 3D DIAGNOSTIC RIGHT MAMMOGRAM POST ULTRASOUND BIOPSY COMPARISON:  Previous exam(s). FINDINGS: 3D Mammographic images were  obtained following ultrasound guided biopsy of the right breast. The biopsy marking clip is in expected location in the 1 o'clock retroareolar region of the right breast. IMPRESSION: Appropriate positioning of the ribbon shaped biopsy marking clip at the site of biopsy in the 1 o'clock retroareolar region of the right breast. Final Assessment: Post Procedure Mammograms for Marker Placement Electronically Signed   By: Baird Lyons M.D.   On: 08/21/2021 08:34   Assessment & Plan:   Class 1 obesity due to excess calories without serious comorbidity with body mass index (BMI) of 31.0 to 31.9 in adult- She is working on her lifestyle modifications. -     Basic metabolic panel; Future -     Hepatic function panel; Future -     TSH; Future -     CBC with Differential/Platelet; Future  DDD (degenerative disc disease), lumbar- Will continue the NSAID. -     Basic metabolic panel; Future -     Hepatic function panel; Future -     TSH; Future -     CBC with Differential/Platelet; Future  Medial epicondylitis of right elbow -     Ambulatory referral to Occupational Therapy  Routine general medical examination at a health care facility- Exam completed, labs reviewed, vaccines are up-to-date, cancer screenings are up-to-date, patient education was given. -     Lipid panel; Future  Conjunctival hemorrhage of right eye- Reassurance offered.     Follow-up: Return in about 1 year (around 09/30/2023).  Sanda Linger, MD

## 2022-10-02 ENCOUNTER — Encounter: Payer: Self-pay | Admitting: Internal Medicine

## 2022-10-02 DIAGNOSIS — H1131 Conjunctival hemorrhage, right eye: Secondary | ICD-10-CM | POA: Insufficient documentation

## 2022-11-02 ENCOUNTER — Encounter: Payer: Commercial Managed Care - PPO | Admitting: Rehabilitative and Restorative Service Providers"

## 2022-12-20 ENCOUNTER — Other Ambulatory Visit (HOSPITAL_COMMUNITY): Payer: Self-pay

## 2022-12-20 ENCOUNTER — Encounter (HOSPITAL_COMMUNITY): Payer: Self-pay

## 2022-12-20 ENCOUNTER — Other Ambulatory Visit: Payer: Self-pay | Admitting: Internal Medicine

## 2022-12-20 DIAGNOSIS — L309 Dermatitis, unspecified: Secondary | ICD-10-CM

## 2022-12-20 MED ORDER — FLUOCINONIDE 0.05 % EX CREA
TOPICAL_CREAM | Freq: Two times a day (BID) | CUTANEOUS | 2 refills | Status: AC
  Filled 2022-12-20: qty 60, 14d supply, fill #0
  Filled 2022-12-27: qty 60, 30d supply, fill #0
  Filled 2023-11-07: qty 60, 30d supply, fill #1

## 2022-12-27 ENCOUNTER — Other Ambulatory Visit (HOSPITAL_COMMUNITY): Payer: Self-pay

## 2022-12-27 ENCOUNTER — Other Ambulatory Visit: Payer: Self-pay

## 2023-02-08 DIAGNOSIS — Z130 Encounter for screening for diseases of the blood and blood-forming organs and certain disorders involving the immune mechanism: Secondary | ICD-10-CM | POA: Diagnosis not present

## 2023-02-08 DIAGNOSIS — R319 Hematuria, unspecified: Secondary | ICD-10-CM | POA: Diagnosis not present

## 2023-02-08 DIAGNOSIS — Z1389 Encounter for screening for other disorder: Secondary | ICD-10-CM | POA: Diagnosis not present

## 2023-02-08 DIAGNOSIS — Z6831 Body mass index (BMI) 31.0-31.9, adult: Secondary | ICD-10-CM | POA: Diagnosis not present

## 2023-02-08 DIAGNOSIS — Z01419 Encounter for gynecological examination (general) (routine) without abnormal findings: Secondary | ICD-10-CM | POA: Diagnosis not present

## 2023-02-26 DIAGNOSIS — H52223 Regular astigmatism, bilateral: Secondary | ICD-10-CM | POA: Diagnosis not present

## 2023-03-21 ENCOUNTER — Other Ambulatory Visit (HOSPITAL_COMMUNITY): Payer: Self-pay

## 2023-03-21 ENCOUNTER — Other Ambulatory Visit: Payer: Self-pay | Admitting: Internal Medicine

## 2023-03-21 DIAGNOSIS — M51369 Other intervertebral disc degeneration, lumbar region without mention of lumbar back pain or lower extremity pain: Secondary | ICD-10-CM

## 2023-03-24 ENCOUNTER — Other Ambulatory Visit (HOSPITAL_BASED_OUTPATIENT_CLINIC_OR_DEPARTMENT_OTHER): Payer: Self-pay

## 2023-03-24 ENCOUNTER — Other Ambulatory Visit: Payer: Self-pay

## 2023-03-24 MED ORDER — DICLOFENAC SODIUM 50 MG PO TBEC
50.0000 mg | DELAYED_RELEASE_TABLET | Freq: Two times a day (BID) | ORAL | 1 refills | Status: AC
  Filled 2023-03-24: qty 180, 90d supply, fill #0
  Filled 2023-11-07: qty 180, 90d supply, fill #1

## 2023-03-25 ENCOUNTER — Other Ambulatory Visit (HOSPITAL_COMMUNITY): Payer: Self-pay

## 2023-08-11 ENCOUNTER — Other Ambulatory Visit: Payer: Self-pay | Admitting: Internal Medicine

## 2023-08-11 DIAGNOSIS — Z1231 Encounter for screening mammogram for malignant neoplasm of breast: Secondary | ICD-10-CM

## 2023-08-14 IMAGING — MG MM BREAST LOCALIZATION CLIP
4 series · 4 of 12 positions shown · non-contrast
Comparison: Previous exam(s).

CLINICAL DATA: Status post ultrasound-guided core biopsy of a right
breast mass

EXAM:
3D DIAGNOSTIC RIGHT MAMMOGRAM POST ULTRASOUND BIOPSY

[R CC synth-2D]
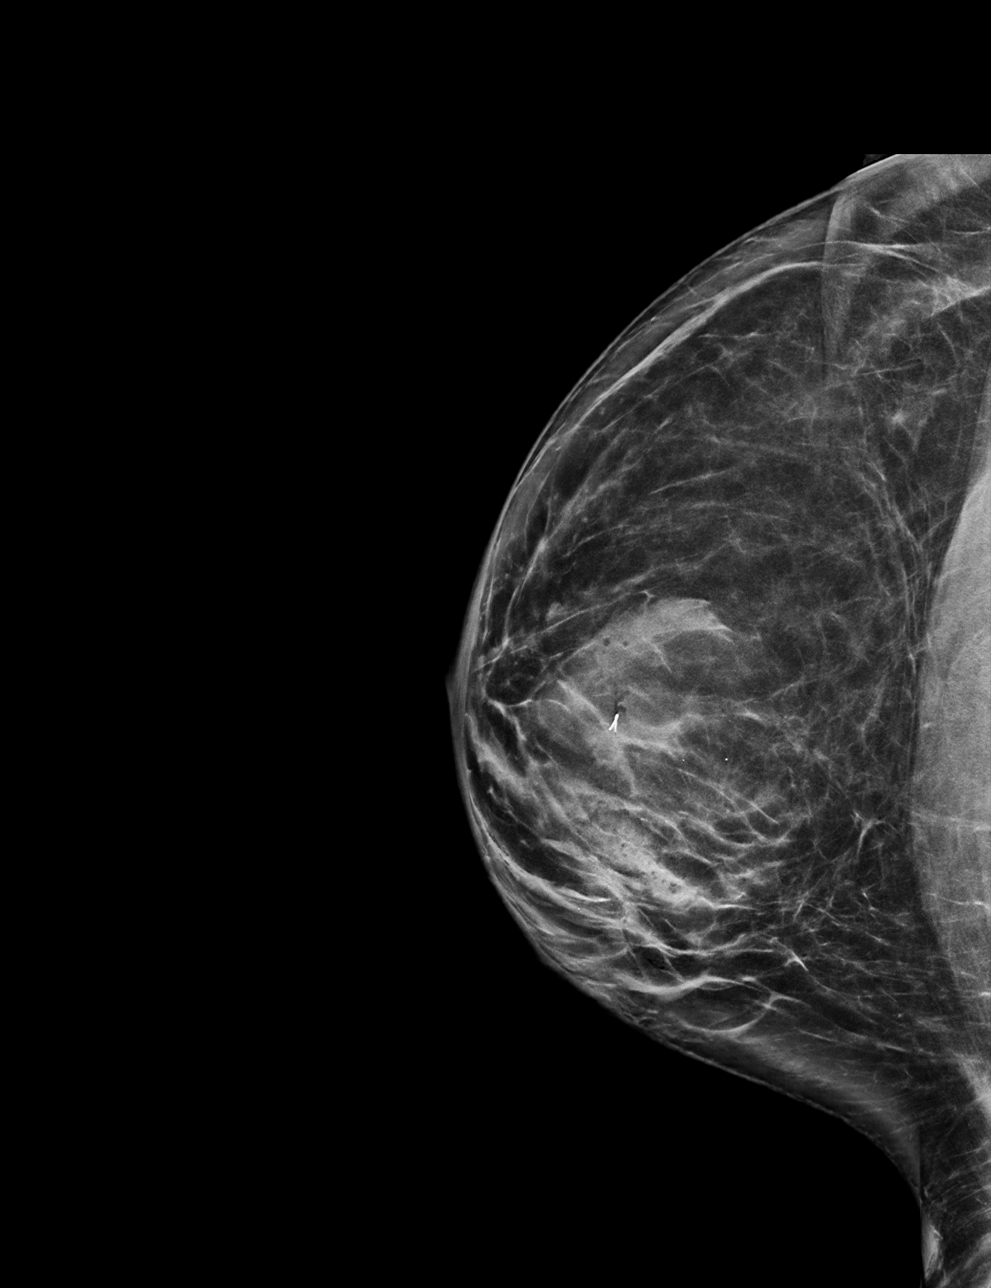

[R ML synth-2D]
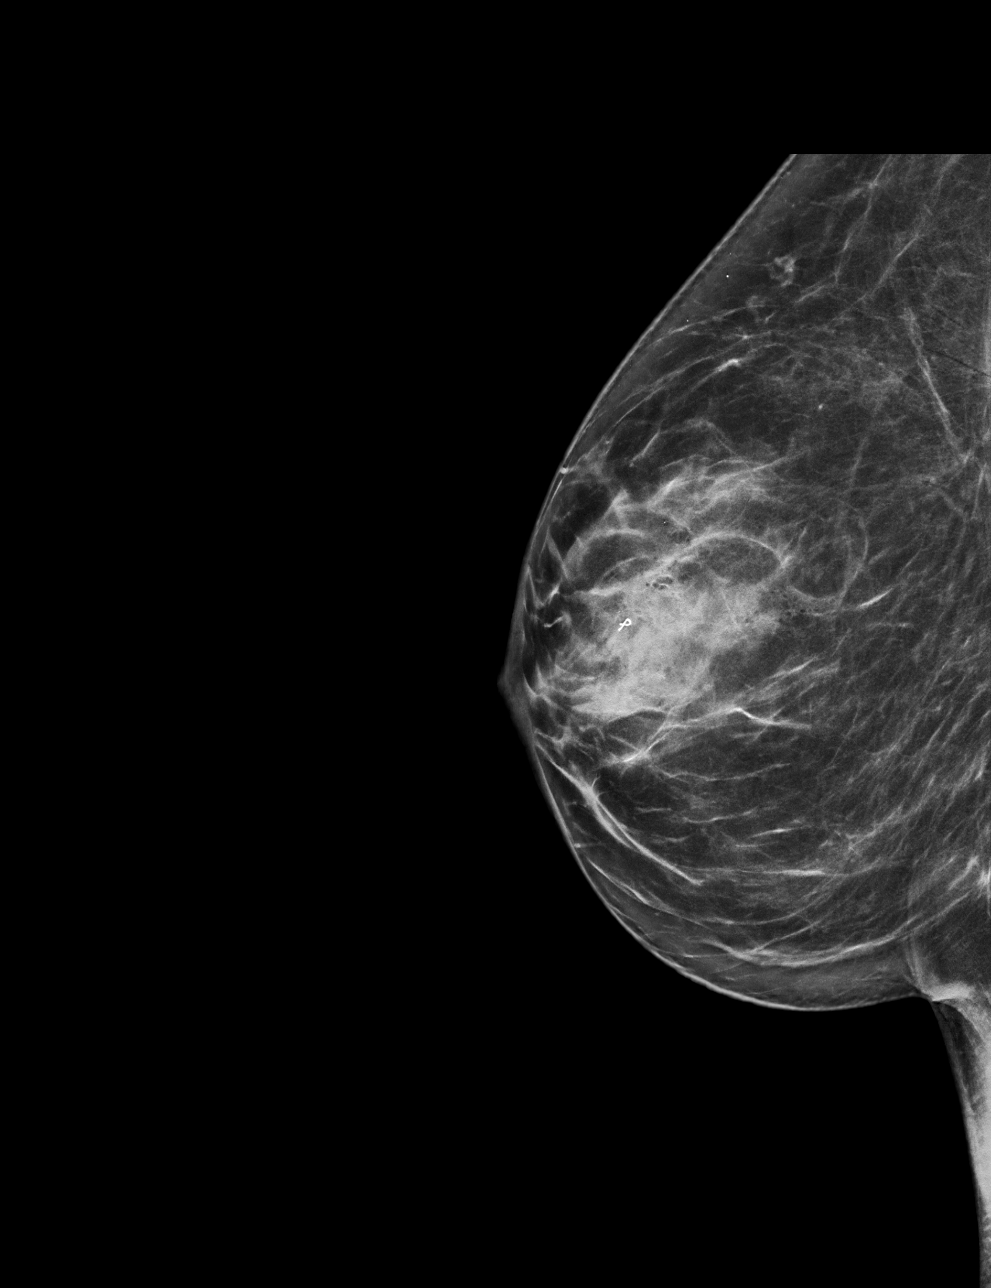

[R CC tomo · tomo slice 35/70.0]
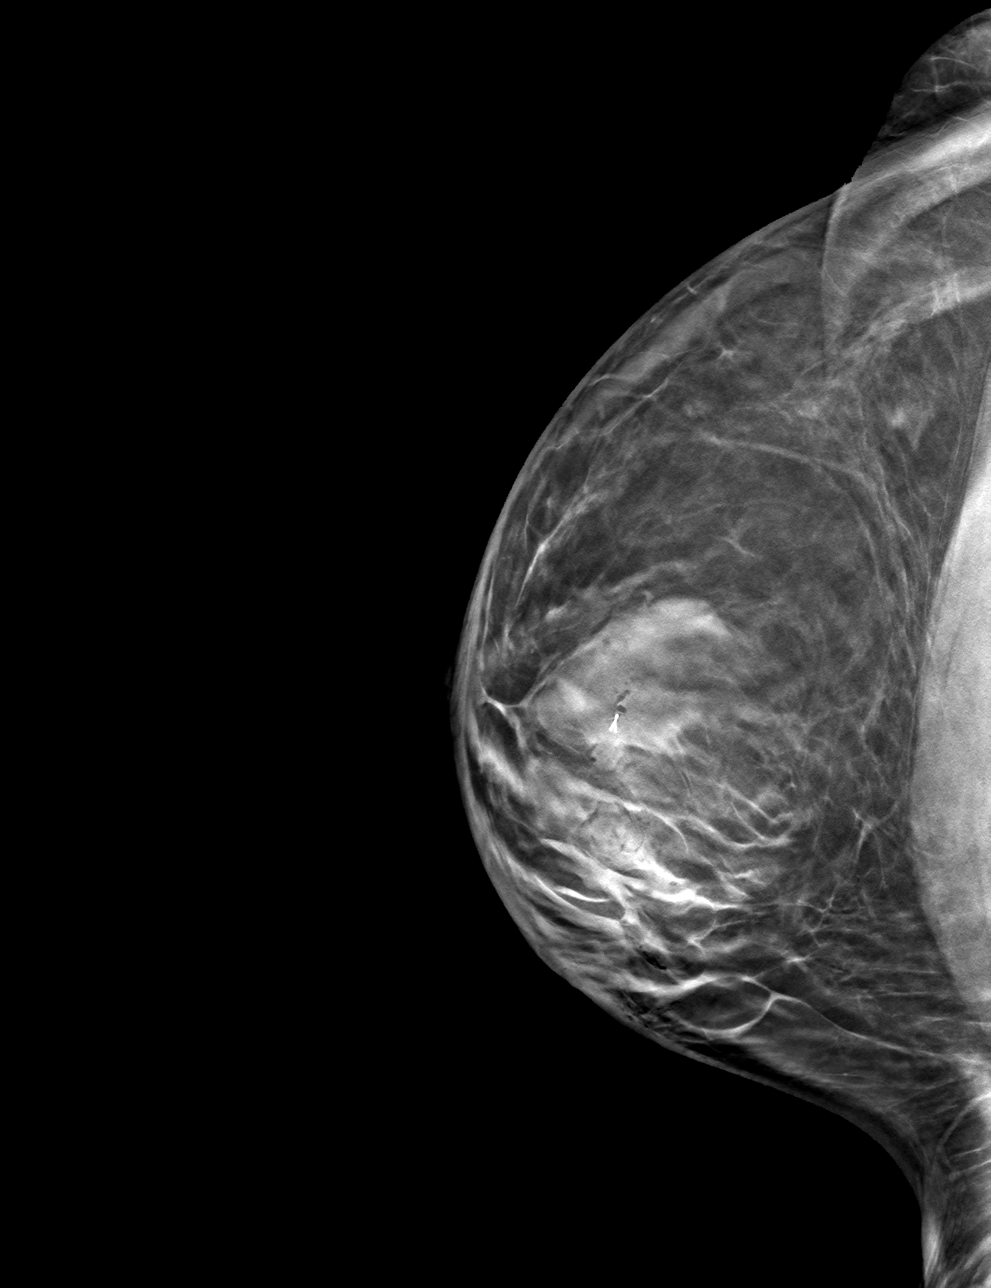

[R ML tomo · tomo slice 35/69.0]
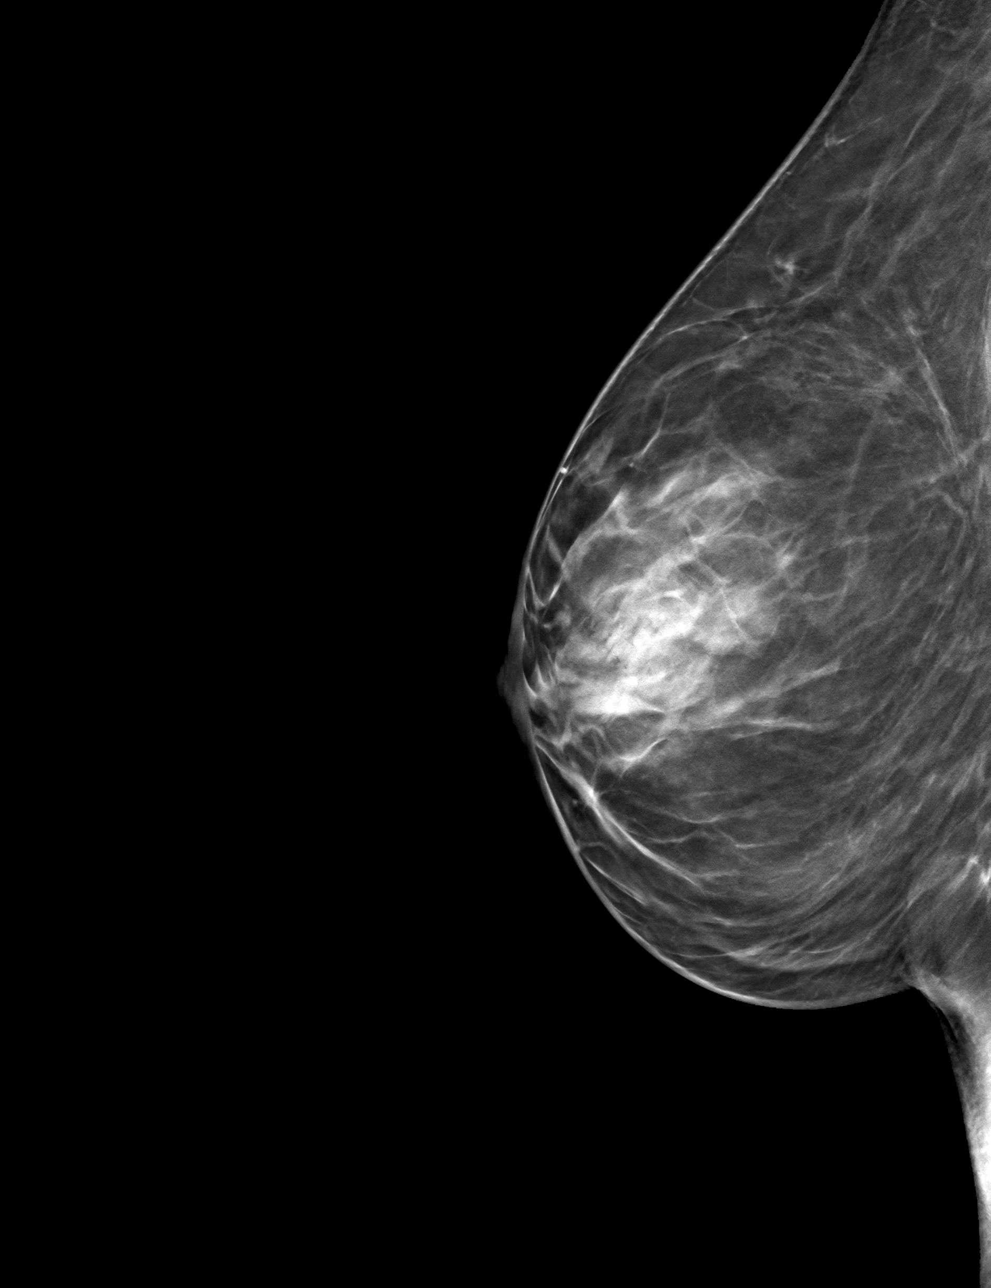

[4 of 12 positions shown; findings below may reference images not displayed]

FINDINGS: 3D Mammographic images were obtained following ultrasound guided
biopsy of the right breast. The biopsy marking clip is in expected
location in the 1 o'clock retroareolar region of the right breast.
IMPRESSION: Appropriate positioning of the ribbon shaped biopsy marking clip at
the site of biopsy in the 1 o'clock retroareolar region of the right
breast.

Final Assessment: Post Procedure Mammograms for Marker Placement

## 2023-08-20 ENCOUNTER — Other Ambulatory Visit: Payer: Self-pay | Admitting: Medical Genetics

## 2023-08-25 ENCOUNTER — Ambulatory Visit
Admission: RE | Admit: 2023-08-25 | Discharge: 2023-08-25 | Disposition: A | Discharge status: Home / Self Care | Source: Ambulatory Visit

## 2023-08-25 DIAGNOSIS — Z1231 Encounter for screening mammogram for malignant neoplasm of breast: Secondary | ICD-10-CM

## 2023-09-12 ENCOUNTER — Other Ambulatory Visit (HOSPITAL_COMMUNITY)
Admission: RE | Admit: 2023-09-12 | Discharge: 2023-09-12 | Disposition: A | Discharge status: Home / Self Care | Payer: Self-pay | Source: Ambulatory Visit | Attending: Oncology | Admitting: Oncology

## 2023-09-23 LAB — GENECONNECT MOLECULAR SCREEN: Genetic Analysis Overall Interpretation: NEGATIVE

## 2023-11-07 ENCOUNTER — Other Ambulatory Visit (HOSPITAL_COMMUNITY): Payer: Self-pay

## 2023-12-20 ENCOUNTER — Ambulatory Visit (INDEPENDENT_AMBULATORY_CARE_PROVIDER_SITE_OTHER): Admitting: Primary Care

## 2024-01-11 ENCOUNTER — Other Ambulatory Visit: Payer: Self-pay | Admitting: Internal Medicine

## 2024-02-13 DIAGNOSIS — Z130 Encounter for screening for diseases of the blood and blood-forming organs and certain disorders involving the immune mechanism: Secondary | ICD-10-CM | POA: Diagnosis not present

## 2024-02-13 DIAGNOSIS — Z1389 Encounter for screening for other disorder: Secondary | ICD-10-CM | POA: Diagnosis not present

## 2024-02-13 DIAGNOSIS — Z01419 Encounter for gynecological examination (general) (routine) without abnormal findings: Secondary | ICD-10-CM | POA: Diagnosis not present

## 2024-02-13 DIAGNOSIS — R319 Hematuria, unspecified: Secondary | ICD-10-CM | POA: Diagnosis not present

## 2024-03-28 ENCOUNTER — Ambulatory Visit: Payer: Self-pay | Admitting: Internal Medicine

## 2024-03-28 ENCOUNTER — Encounter: Payer: Self-pay | Admitting: Internal Medicine

## 2024-03-28 ENCOUNTER — Ambulatory Visit: Admitting: Internal Medicine

## 2024-03-28 VITALS — BP 114/78 | HR 90 | Temp 98.5°F | Resp 16 | Ht 65.5 in | Wt 207.6 lb

## 2024-03-28 DIAGNOSIS — E66811 Obesity, class 1: Secondary | ICD-10-CM | POA: Diagnosis not present

## 2024-03-28 DIAGNOSIS — Z6834 Body mass index (BMI) 34.0-34.9, adult: Secondary | ICD-10-CM | POA: Diagnosis not present

## 2024-03-28 DIAGNOSIS — Z0000 Encounter for general adult medical examination without abnormal findings: Secondary | ICD-10-CM | POA: Diagnosis not present

## 2024-03-28 DIAGNOSIS — J069 Acute upper respiratory infection, unspecified: Secondary | ICD-10-CM | POA: Diagnosis not present

## 2024-03-28 DIAGNOSIS — E6609 Other obesity due to excess calories: Secondary | ICD-10-CM | POA: Diagnosis not present

## 2024-03-28 DIAGNOSIS — Z1159 Encounter for screening for other viral diseases: Secondary | ICD-10-CM | POA: Insufficient documentation

## 2024-03-28 DIAGNOSIS — Z0001 Encounter for general adult medical examination with abnormal findings: Secondary | ICD-10-CM

## 2024-03-28 LAB — HEPATIC FUNCTION PANEL
ALT: 20 U/L (ref 3–35)
AST: 25 U/L (ref 5–37)
Albumin: 4.6 g/dL (ref 3.5–5.2)
Alkaline Phosphatase: 45 U/L (ref 39–117)
Bilirubin, Direct: 0.1 mg/dL (ref 0.1–0.3)
Total Bilirubin: 0.4 mg/dL (ref 0.2–1.2)
Total Protein: 7.3 g/dL (ref 6.0–8.3)

## 2024-03-28 LAB — CBC WITH DIFFERENTIAL/PLATELET
Basophils Absolute: 0 K/uL (ref 0.0–0.1)
Basophils Relative: 0.8 % (ref 0.0–3.0)
Eosinophils Absolute: 0.2 K/uL (ref 0.0–0.7)
Eosinophils Relative: 4 % (ref 0.0–5.0)
HCT: 42.4 % (ref 36.0–46.0)
Hemoglobin: 14.2 g/dL (ref 12.0–15.0)
Lymphocytes Relative: 34.9 % (ref 12.0–46.0)
Lymphs Abs: 1.5 K/uL (ref 0.7–4.0)
MCHC: 33.6 g/dL (ref 30.0–36.0)
MCV: 88.3 fl (ref 78.0–100.0)
Monocytes Absolute: 0.6 K/uL (ref 0.1–1.0)
Monocytes Relative: 13.7 % — ABNORMAL HIGH (ref 3.0–12.0)
Neutro Abs: 2.1 K/uL (ref 1.4–7.7)
Neutrophils Relative %: 46.6 % (ref 43.0–77.0)
Platelets: 244 K/uL (ref 150.0–400.0)
RBC: 4.8 Mil/uL (ref 3.87–5.11)
RDW: 13.6 % (ref 11.5–15.5)
WBC: 4.4 K/uL (ref 4.0–10.5)

## 2024-03-28 LAB — TSH: TSH: 1.66 u[IU]/mL (ref 0.35–5.50)

## 2024-03-28 LAB — BASIC METABOLIC PANEL WITH GFR
BUN: 11 mg/dL (ref 6–23)
CO2: 28 meq/L (ref 19–32)
Calcium: 9.4 mg/dL (ref 8.4–10.5)
Chloride: 102 meq/L (ref 96–112)
Creatinine, Ser: 0.87 mg/dL (ref 0.40–1.20)
GFR: 81.74 mL/min
Glucose, Bld: 96 mg/dL (ref 70–99)
Potassium: 4 meq/L (ref 3.5–5.1)
Sodium: 138 meq/L (ref 135–145)

## 2024-03-28 LAB — LIPID PANEL
Cholesterol: 165 mg/dL (ref 28–200)
HDL: 57.6 mg/dL
LDL Cholesterol: 88 mg/dL (ref 10–99)
NonHDL: 107.13
Total CHOL/HDL Ratio: 3
Triglycerides: 95 mg/dL (ref 10.0–149.0)
VLDL: 19 mg/dL (ref 0.0–40.0)

## 2024-03-28 LAB — POC COVID19 BINAXNOW: SARS Coronavirus 2 Ag: NEGATIVE

## 2024-03-28 LAB — HEMOGLOBIN A1C: Hgb A1c MFr Bld: 5.8 % (ref 4.6–6.5)

## 2024-03-28 NOTE — Patient Instructions (Signed)

## 2024-03-28 NOTE — Progress Notes (Signed)
 "  Subjective:  Patient ID: Christina [name], female    DOB: 1980/04/17  Age: 44 y.o. MRN: [id number]  CC: Annual Exam and URI   HPI Shantanique M Cairrikier [name] presents for a CPX and f/up ----  Discussed the use of AI scribe software for clinical note transcription with the patient, who gave verbal consent to proceed.  History of Present Illness Christina [name] is a 44 year old female who presents with upper respiratory symptoms and body aches following a recent skiing trip.  She has been experiencing symptoms for 4 days, specifically on Sunday and Monday, with improvement noted by Wednesday. Her symptoms included cough, fever, and body aches, which she initially attributed to a flu-like illness. She also experienced coughing up sputum. No testing for flu or COVID-19 was performed, although her symptoms were similar to those experienced by family members, Ole and Ester, who were also sick. She has had COVID-19 in the past but did not specify the timeline of her previous infection.  She has been managing her symptoms with over-the-counter medications including DayQuil, Theraflu, and Tylenol . She also takes Diclofenac  as needed for arthritis-related pain. She reports a headache and some diarrhea on Monday morning, which she described as 'gross' but has not had any since. No dizziness or lightheadedness, but she mentions shortness of breath due to shoulder pain from a fall while skiing.  She has been skiing at [facility] and [place] area, where she fell multiple times, resulting in shoulder pain. She believes the pain is muscular. The pain is exacerbated by deep breaths. She engaged in activities such as snow tubing and ice skating during her trip, which lasted for the weekend.   Outpatient Medications Prior to Visit  Medication Sig Dispense Refill   diclofenac  (VOLTAREN ) 50 MG EC tablet Take 1 tablet (50 mg total) by mouth 2 (two) times daily. 180 tablet 1    fluocinonide  ointment (LIDEX ) 0.05 %      amoxicillin -clavulanate (AUGMENTIN ) 875-125 MG tablet Take 1 tablet by mouth 2 (two) times daily. 14 tablet 0   lisdexamfetamine  (VYVANSE ) 20 MG capsule TAKE 1 CAPSULE BY MOUTH ONCE A DAY 30 capsule 0   norethindrone  (AYGESTIN ) 5 MG tablet Take 1 tablet by mouth daily until no bleeding for 2 weeks 30 tablet 0   trimethoprim -polymyxin b  (POLYTRIM ) ophthalmic solution Apply 1-2 drops into affected eye QID x 5 days. 10 mL 0   Ubrogepant  (UBRELVY ) 50 MG TABS Take 1 tablet by mouth as needed. 10 tablet 5   No facility-administered medications prior to visit.    ROS Review of Systems  Constitutional:  Positive for chills, fatigue and fever. Negative for appetite change and diaphoresis.  HENT: Negative.  Negative for sore throat and trouble swallowing.   Respiratory:  Positive for cough. Negative for chest tightness, shortness of breath and wheezing.   Cardiovascular:  Negative for chest pain, palpitations and leg swelling.  Gastrointestinal: Negative.  Negative for abdominal pain, constipation, diarrhea, nausea and vomiting.  Endocrine: Negative.   Genitourinary:  Negative for difficulty urinating and dysuria.  Musculoskeletal:  Positive for arthralgias, back pain and myalgias.  Skin: Negative.   Neurological: Negative.  Negative for dizziness and weakness.  Hematological:  Negative for adenopathy. Does not bruise/bleed easily.  Psychiatric/Behavioral: Negative.      Objective:  BP 114/78 (BP Location: Left Arm, Patient Position: Sitting)   Pulse 90   Temp 98.5 F (36.9 C) (Temporal)   Resp 16   Ht  5' 5.5 (1.664 m)   Wt 207 lb 9.6 oz (94.2 kg)   LMP 03/23/2024   SpO2 96%   BMI 34.02 kg/m   BP Readings from Last 3 Encounters:  03/28/24 114/78  09/30/22 120/82  10/28/21 100/66    Wt Readings from Last 3 Encounters:  03/28/24 207 lb 9.6 oz (94.2 kg)  09/30/22 192 lb (87.1 kg)  10/28/21 168 lb (76.2 kg)    Physical Exam Vitals  reviewed.  Constitutional:      Appearance: Normal appearance.  HENT:     Mouth/Throat:     Mouth: Mucous membranes are moist.     Pharynx: Oropharynx is clear. No pharyngeal swelling, oropharyngeal exudate or posterior oropharyngeal erythema.     Tonsils: No tonsillar exudate or tonsillar abscesses.  Eyes:     General: No scleral icterus.    Conjunctiva/sclera: Conjunctivae normal.  Cardiovascular:     Rate and Rhythm: Normal rate and regular rhythm.     Pulses: Normal pulses.     Heart sounds: No murmur heard.    No friction rub. No gallop.  Pulmonary:     Effort: Pulmonary effort is normal.     Breath sounds: No stridor. No wheezing, rhonchi or rales.  Abdominal:     General: Abdomen is flat.     Palpations: There is no mass.     Tenderness: There is no abdominal tenderness. There is no guarding.     Hernia: No hernia is present.  Musculoskeletal:        General: Normal range of motion.     Cervical back: Neck supple.     Right lower leg: No edema.     Left lower leg: No edema.  Lymphadenopathy:     Cervical: No cervical adenopathy.  Skin:    General: Skin is warm and dry.  Neurological:     General: No focal deficit present.     Mental Status: She is alert. Mental status is at baseline.  Psychiatric:        Mood and Affect: Mood normal.        Behavior: Behavior normal.     Lab Results  Component Value Date   WBC 4.4 03/28/2024   HGB 14.2 03/28/2024   HCT 42.4 03/28/2024   PLT 244.0 03/28/2024   GLUCOSE 96 03/28/2024   CHOL 165 03/28/2024   TRIG 95.0 03/28/2024   HDL 57.60 03/28/2024   LDLCALC 88 03/28/2024   ALT 20 03/28/2024   AST 25 03/28/2024   NA 138 03/28/2024   K 4.0 03/28/2024   CL 102 03/28/2024   CREATININE 0.87 03/28/2024   BUN 11 03/28/2024   CO2 28 03/28/2024   TSH 1.66 03/28/2024   HGBA1C 5.8 03/28/2024    No results found.  Assessment & Plan:  Need for hepatitis C screening test -     Hepatitis C antibody; Future  Viral URI  with cough- Sx's are improving. -     POC COVID-19 BinaxNow -     CBC with Differential/Platelet; Future  Encounter for general adult medical examination with abnormal findings- Exam completed, labs reviewed, vaccines reviewed, cancer screenings are UTD, pt ed material was given.  -     Lipid panel; Future  Class 1 obesity due to excess calories without serious comorbidity with body mass index (BMI) of 34.0 to 34.9 in adult -     Basic metabolic panel with GFR; Future -     CBC with Differential/Platelet; Future -  TSH; Future -     Hepatic function panel; Future -     Hemoglobin A1c; Future     Follow-up: Return in about 6 months (around 09/25/2024).  Debby Molt, MD "

## 2024-03-29 LAB — HEPATITIS C ANTIBODY: Hepatitis C Ab: NONREACTIVE
